# Patient Record
Sex: Female | Born: 1940
Health system: Southern US, Community
[De-identification: ages and names within clinical notes are randomized; demographics above are authoritative.]

## PROBLEM LIST (undated history)

## (undated) DIAGNOSIS — J302 Other seasonal allergic rhinitis: Secondary | ICD-10-CM

## (undated) DIAGNOSIS — Z973 Presence of spectacles and contact lenses: Secondary | ICD-10-CM

## (undated) DIAGNOSIS — Z8719 Personal history of other diseases of the digestive system: Secondary | ICD-10-CM

## (undated) DIAGNOSIS — E785 Hyperlipidemia, unspecified: Secondary | ICD-10-CM

## (undated) DIAGNOSIS — Z8673 Personal history of transient ischemic attack (TIA), and cerebral infarction without residual deficits: Secondary | ICD-10-CM

## (undated) HISTORY — PX: CATARACT EXTRACTION: SUR2

## (undated) HISTORY — PX: COLONOSCOPY: SHX174

## (undated) HISTORY — PX: TOTAL ABDOMINAL HYSTERECTOMY: SHX209

---

## 1998-08-08 ENCOUNTER — Ambulatory Visit (HOSPITAL_COMMUNITY): Admission: RE | Admit: 1998-08-08 | Discharge: 1998-08-08 | Payer: Self-pay | Admitting: General Surgery

## 1998-11-02 ENCOUNTER — Encounter: Admission: RE | Admit: 1998-11-02 | Discharge: 1999-01-31 | Payer: Self-pay | Admitting: Obstetrics and Gynecology

## 1998-11-24 HISTORY — PX: FEMORAL HERNIA REPAIR: SUR1179

## 1999-03-18 ENCOUNTER — Ambulatory Visit (HOSPITAL_COMMUNITY): Admission: RE | Admit: 1999-03-18 | Discharge: 1999-03-18 | Payer: Self-pay | Admitting: General Surgery

## 1999-03-18 ENCOUNTER — Encounter (HOSPITAL_BASED_OUTPATIENT_CLINIC_OR_DEPARTMENT_OTHER): Payer: Self-pay | Admitting: General Surgery

## 1999-11-08 ENCOUNTER — Inpatient Hospital Stay (HOSPITAL_COMMUNITY): Admission: EM | Admit: 1999-11-08 | Discharge: 1999-11-09 | Payer: Self-pay | Admitting: Emergency Medicine

## 1999-11-08 ENCOUNTER — Encounter: Payer: Self-pay | Admitting: Emergency Medicine

## 1999-11-09 ENCOUNTER — Encounter: Payer: Self-pay | Admitting: Surgery

## 1999-11-21 ENCOUNTER — Inpatient Hospital Stay (HOSPITAL_COMMUNITY): Admission: EM | Admit: 1999-11-21 | Discharge: 1999-12-02 | Payer: Self-pay | Admitting: Emergency Medicine

## 1999-11-21 ENCOUNTER — Encounter: Payer: Self-pay | Admitting: Family Medicine

## 1999-11-21 ENCOUNTER — Encounter (INDEPENDENT_AMBULATORY_CARE_PROVIDER_SITE_OTHER): Payer: Self-pay | Admitting: Specialist

## 1999-11-21 ENCOUNTER — Encounter (HOSPITAL_BASED_OUTPATIENT_CLINIC_OR_DEPARTMENT_OTHER): Payer: Self-pay | Admitting: General Surgery

## 1999-11-28 ENCOUNTER — Encounter (HOSPITAL_BASED_OUTPATIENT_CLINIC_OR_DEPARTMENT_OTHER): Payer: Self-pay | Admitting: General Surgery

## 1999-12-12 ENCOUNTER — Ambulatory Visit (HOSPITAL_COMMUNITY): Admission: RE | Admit: 1999-12-12 | Discharge: 1999-12-12 | Payer: Self-pay | Admitting: General Surgery

## 1999-12-12 ENCOUNTER — Encounter (HOSPITAL_BASED_OUTPATIENT_CLINIC_OR_DEPARTMENT_OTHER): Payer: Self-pay | Admitting: General Surgery

## 2000-01-01 ENCOUNTER — Other Ambulatory Visit: Admission: RE | Admit: 2000-01-01 | Discharge: 2000-01-01 | Payer: Self-pay | Admitting: Obstetrics and Gynecology

## 2000-03-27 ENCOUNTER — Ambulatory Visit (HOSPITAL_COMMUNITY): Admission: RE | Admit: 2000-03-27 | Discharge: 2000-03-27 | Payer: Self-pay | Admitting: Obstetrics and Gynecology

## 2000-03-27 ENCOUNTER — Encounter: Payer: Self-pay | Admitting: Obstetrics and Gynecology

## 2001-04-14 ENCOUNTER — Ambulatory Visit (HOSPITAL_COMMUNITY): Admission: RE | Admit: 2001-04-14 | Discharge: 2001-04-14 | Payer: Self-pay | Admitting: Gastroenterology

## 2001-04-14 ENCOUNTER — Encounter (INDEPENDENT_AMBULATORY_CARE_PROVIDER_SITE_OTHER): Payer: Self-pay | Admitting: *Deleted

## 2001-06-16 ENCOUNTER — Other Ambulatory Visit: Admission: RE | Admit: 2001-06-16 | Discharge: 2001-06-16 | Payer: Self-pay | Admitting: Obstetrics and Gynecology

## 2001-11-18 ENCOUNTER — Inpatient Hospital Stay (HOSPITAL_COMMUNITY): Admission: EM | Admit: 2001-11-18 | Discharge: 2001-11-23 | Payer: Self-pay | Admitting: Emergency Medicine

## 2001-11-18 ENCOUNTER — Encounter: Payer: Self-pay | Admitting: Family Medicine

## 2001-11-18 ENCOUNTER — Encounter: Payer: Self-pay | Admitting: Internal Medicine

## 2001-11-19 ENCOUNTER — Encounter: Payer: Self-pay | Admitting: Internal Medicine

## 2001-11-20 ENCOUNTER — Encounter: Payer: Self-pay | Admitting: General Surgery

## 2001-11-21 ENCOUNTER — Encounter: Payer: Self-pay | Admitting: Internal Medicine

## 2001-11-24 DIAGNOSIS — Z8673 Personal history of transient ischemic attack (TIA), and cerebral infarction without residual deficits: Secondary | ICD-10-CM

## 2001-11-24 HISTORY — DX: Personal history of transient ischemic attack (TIA), and cerebral infarction without residual deficits: Z86.73

## 2002-02-25 ENCOUNTER — Encounter: Payer: Self-pay | Admitting: Gastroenterology

## 2002-02-25 ENCOUNTER — Ambulatory Visit (HOSPITAL_COMMUNITY): Admission: RE | Admit: 2002-02-25 | Discharge: 2002-02-25 | Payer: Self-pay | Admitting: Gastroenterology

## 2002-04-02 ENCOUNTER — Inpatient Hospital Stay (HOSPITAL_COMMUNITY): Admission: EM | Admit: 2002-04-02 | Discharge: 2002-04-05 | Payer: Self-pay | Admitting: *Deleted

## 2002-04-03 ENCOUNTER — Encounter: Payer: Self-pay | Admitting: General Surgery

## 2002-04-04 ENCOUNTER — Encounter: Payer: Self-pay | Admitting: General Surgery

## 2002-05-23 ENCOUNTER — Encounter: Admission: RE | Admit: 2002-05-23 | Discharge: 2002-05-23 | Payer: Self-pay | Admitting: General Surgery

## 2002-05-23 ENCOUNTER — Encounter: Payer: Self-pay | Admitting: General Surgery

## 2002-05-24 ENCOUNTER — Encounter: Payer: Self-pay | Admitting: General Surgery

## 2002-05-24 ENCOUNTER — Encounter: Admission: RE | Admit: 2002-05-24 | Discharge: 2002-05-24 | Payer: Self-pay | Admitting: General Surgery

## 2002-05-27 ENCOUNTER — Encounter: Payer: Self-pay | Admitting: Emergency Medicine

## 2002-05-27 ENCOUNTER — Encounter: Payer: Self-pay | Admitting: Surgery

## 2002-05-27 ENCOUNTER — Inpatient Hospital Stay (HOSPITAL_COMMUNITY): Admission: EM | Admit: 2002-05-27 | Discharge: 2002-05-29 | Payer: Self-pay | Admitting: Emergency Medicine

## 2002-05-28 ENCOUNTER — Encounter: Payer: Self-pay | Admitting: General Surgery

## 2002-05-30 ENCOUNTER — Inpatient Hospital Stay (HOSPITAL_COMMUNITY): Admission: EM | Admit: 2002-05-30 | Discharge: 2002-05-31 | Payer: Self-pay

## 2002-05-30 ENCOUNTER — Encounter: Payer: Self-pay | Admitting: Neurology

## 2002-05-31 ENCOUNTER — Encounter: Payer: Self-pay | Admitting: Neurology

## 2002-05-31 ENCOUNTER — Encounter (INDEPENDENT_AMBULATORY_CARE_PROVIDER_SITE_OTHER): Payer: Self-pay | Admitting: Cardiology

## 2003-03-07 ENCOUNTER — Encounter: Payer: Self-pay | Admitting: Gastroenterology

## 2003-03-07 ENCOUNTER — Encounter: Admission: RE | Admit: 2003-03-07 | Discharge: 2003-03-07 | Payer: Self-pay | Admitting: Gastroenterology

## 2006-03-25 ENCOUNTER — Encounter (HOSPITAL_BASED_OUTPATIENT_CLINIC_OR_DEPARTMENT_OTHER): Payer: Self-pay | Admitting: General Surgery

## 2009-04-13 ENCOUNTER — Emergency Department (HOSPITAL_COMMUNITY): Admission: EM | Admit: 2009-04-13 | Discharge: 2009-04-13 | Payer: Self-pay | Admitting: Emergency Medicine

## 2009-04-13 ENCOUNTER — Emergency Department (HOSPITAL_COMMUNITY): Admission: EM | Admit: 2009-04-13 | Discharge: 2009-04-13 | Payer: Self-pay | Admitting: Family Medicine

## 2009-04-13 IMAGING — CR DG CHEST 2V
2 series · 2 of 2 positions shown · non-contrast
Comparison: [DATE]

CLINICAL DATA: Short of breath

CHEST - 2 VIEW

[w chest pa]
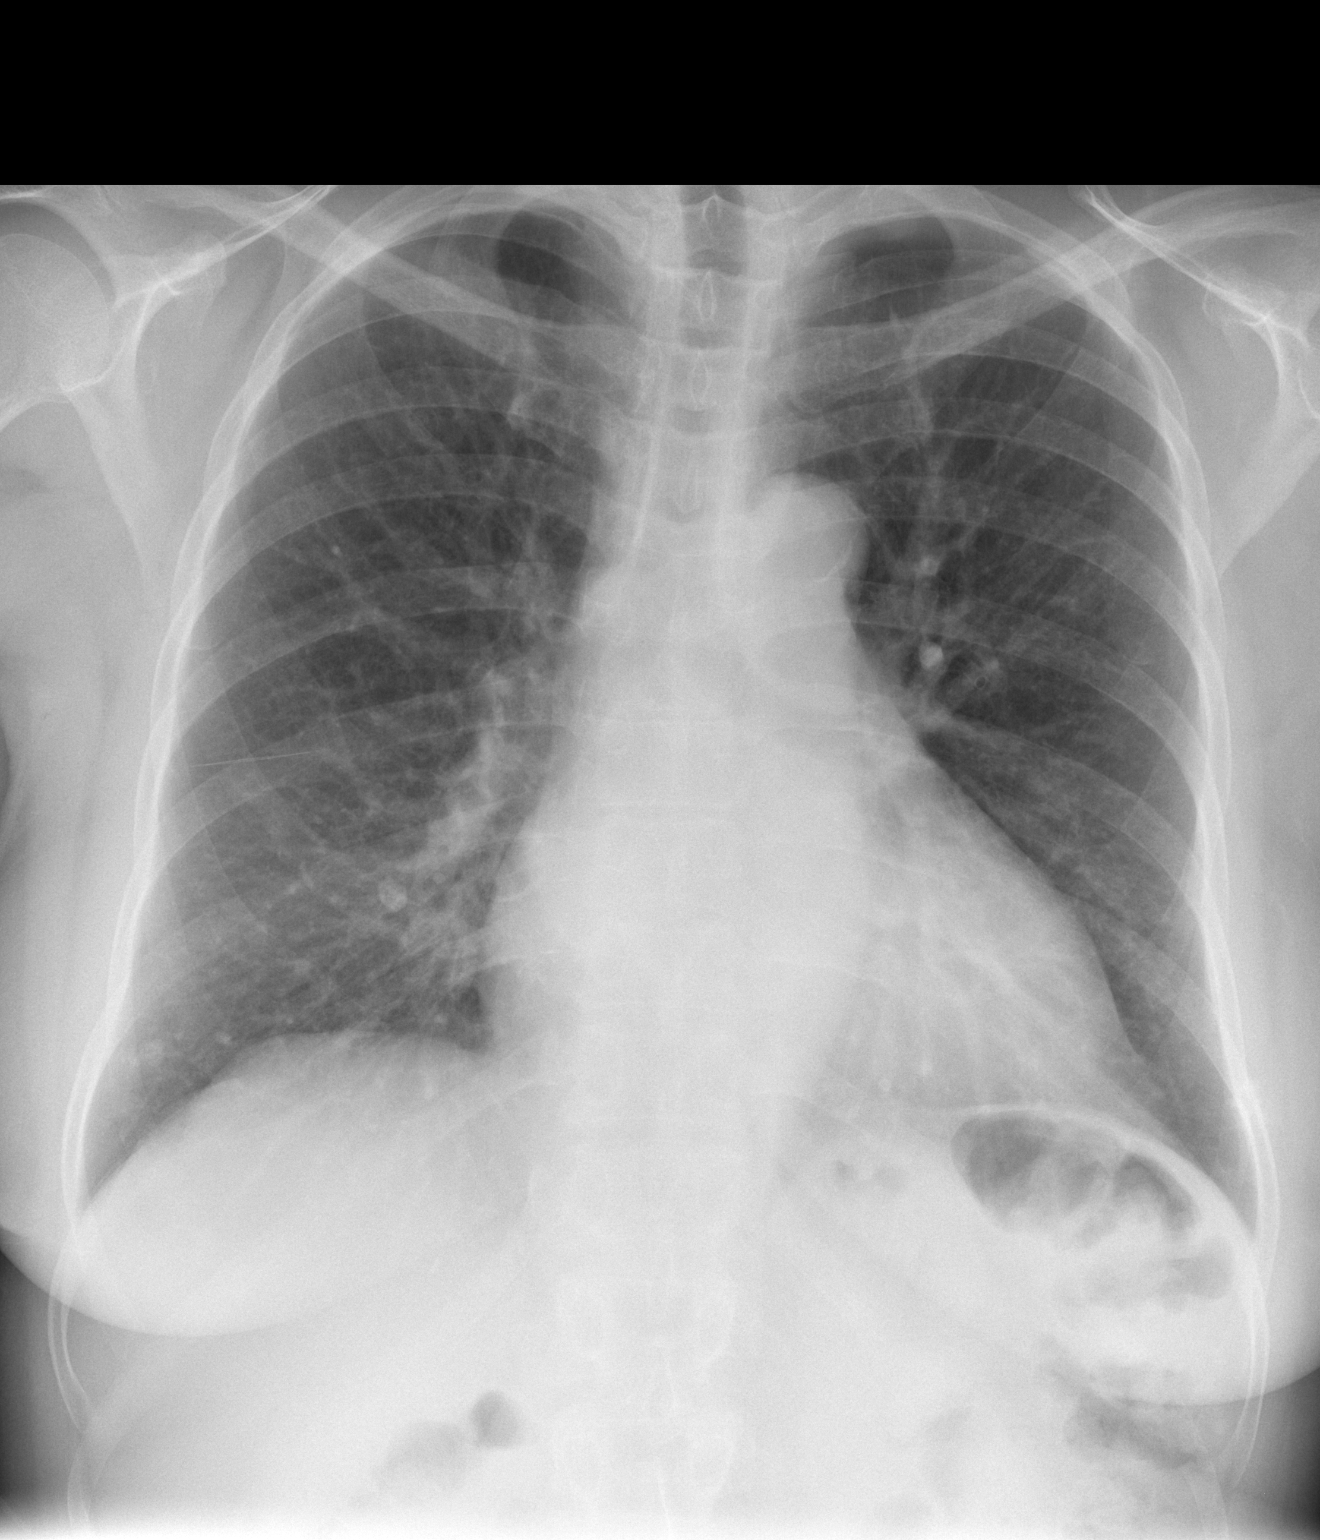

[w chest lat]
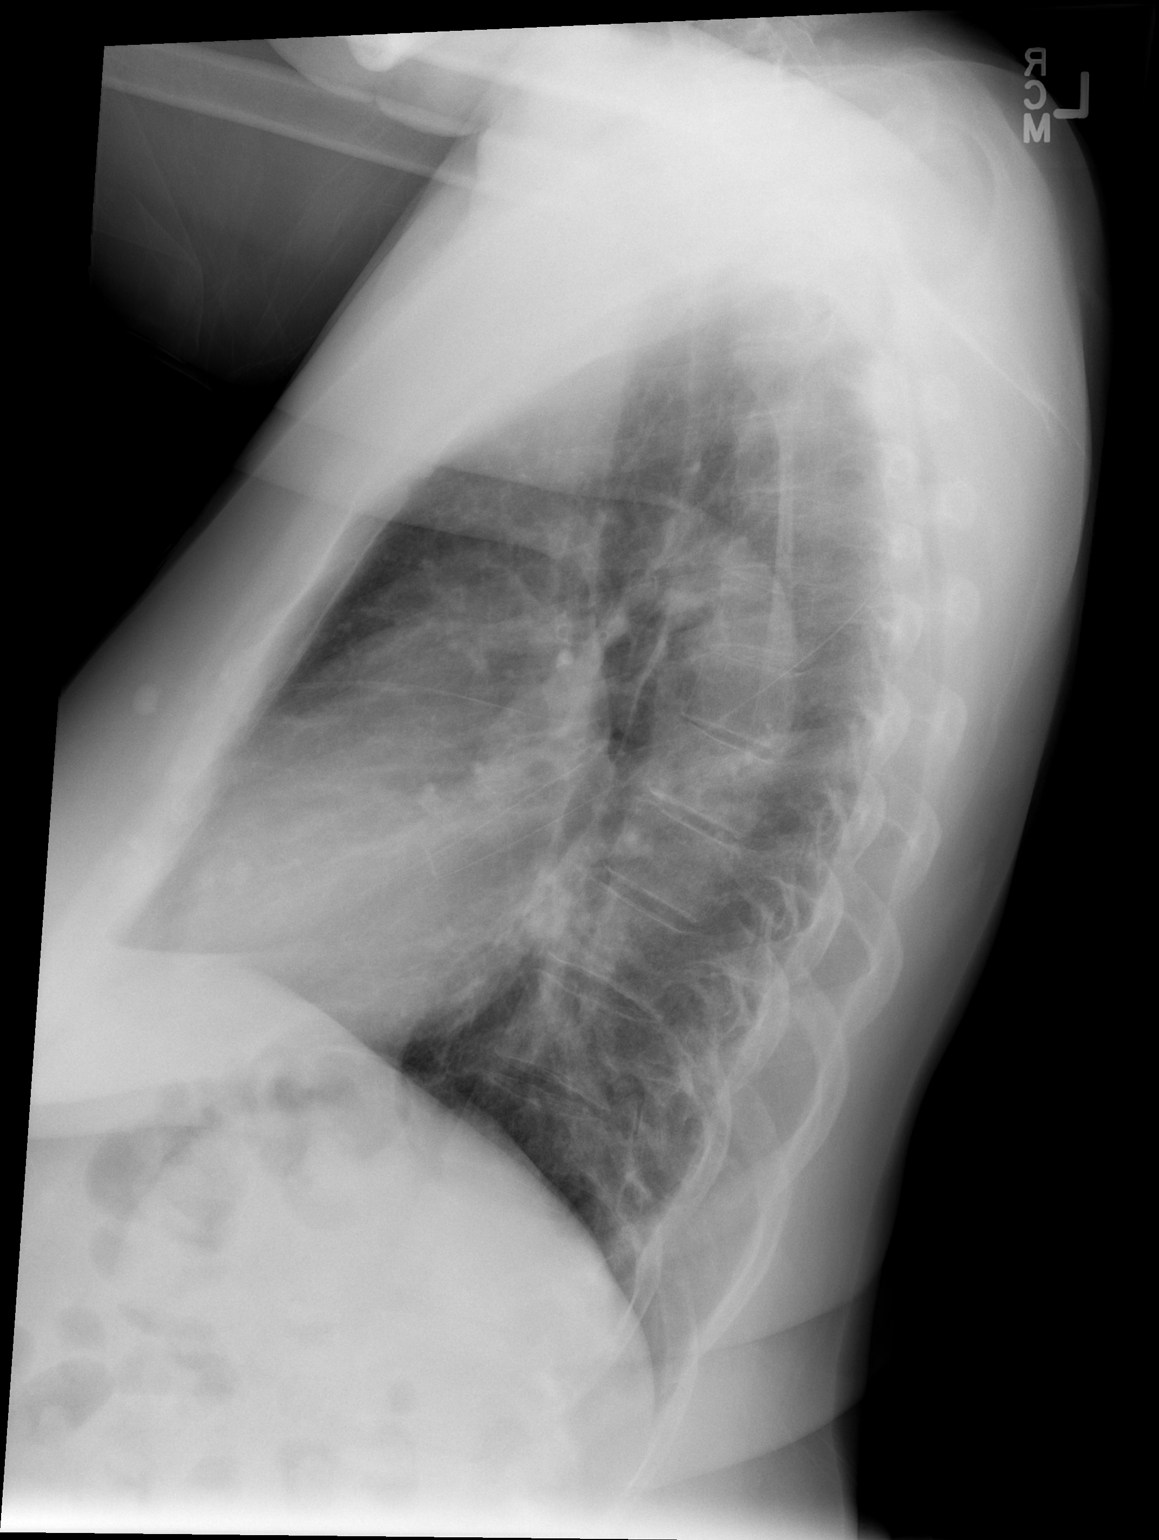

[2 of 2 positions shown; findings below may reference images not displayed]

FINDINGS: Cardiomegaly and pulmonary vascular congestion.  No
pulmonary edema or infiltrate.
IMPRESSION: Cardiomegaly and moderate vascular congestion.

## 2009-05-05 ENCOUNTER — Ambulatory Visit: Payer: Self-pay | Admitting: Cardiovascular Disease

## 2009-05-05 ENCOUNTER — Inpatient Hospital Stay (HOSPITAL_COMMUNITY): Admission: EM | Admit: 2009-05-05 | Discharge: 2009-05-07 | Payer: Self-pay | Admitting: Emergency Medicine

## 2009-05-05 ENCOUNTER — Ambulatory Visit: Payer: Self-pay | Admitting: Family Medicine

## 2009-05-05 IMAGING — CT CT ANGIO CHEST
2 of 6 series · 19 of 36 positions shown · IV contrast (APPLIED)
Comparison: None

CLINICAL DATA: Midsternal chest pain, shortness of breath

CT ANGIOGRAPHY CHEST WITH CONTRAST
TECHNIQUE: Multidetector CT imaging of the chest was performed
using the standard protocol during bolus administration of
intravenous contrast. Multiplanar CT image reconstructions
including MIPs were obtained to evaluate the vascular anatomy.
Contrast: 80 ml [DF] IV

[Series 8: pulm embolism 1.0 b25f thins · axial · 0.56mm/px · z∈[-76,+112]mm · 18 of 209 slices shown]
[im 11/209  lung]
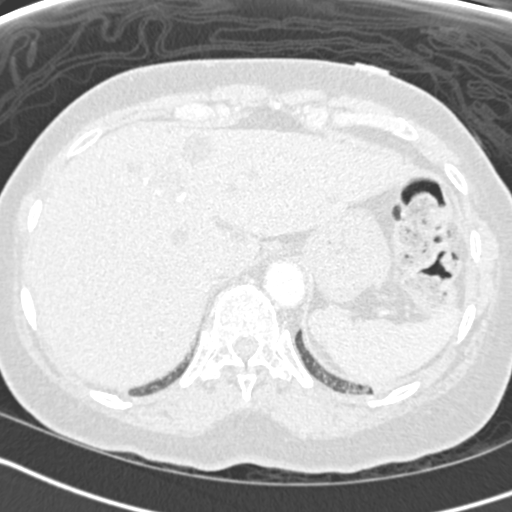
[im 21/209  mediastinal]
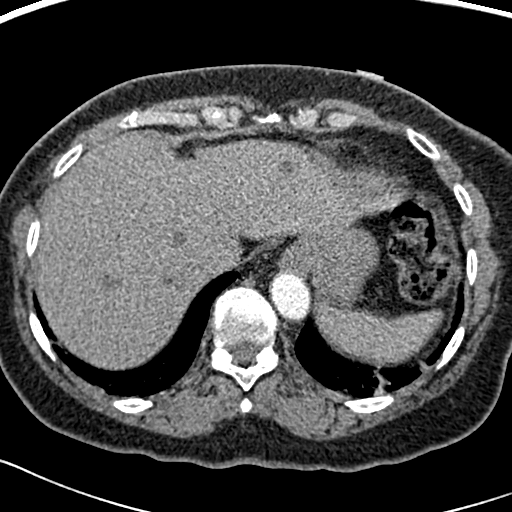
[im 32/209  lung]
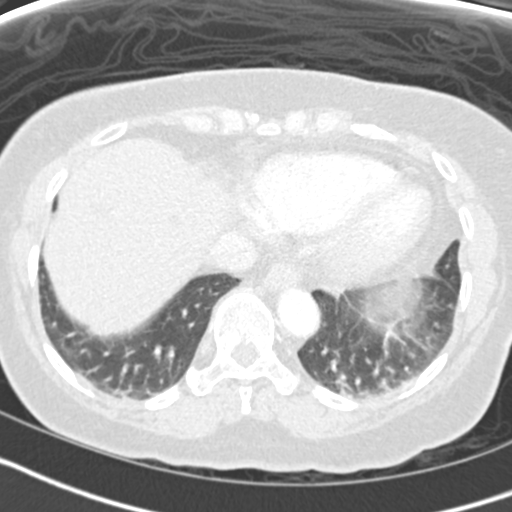
[im 42/209  mediastinal]
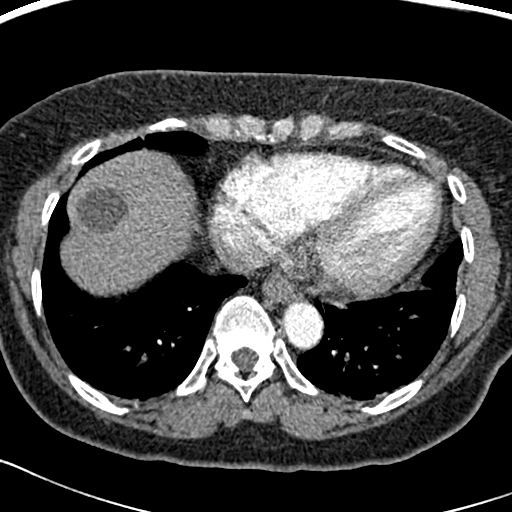
[im 53/209  lung]
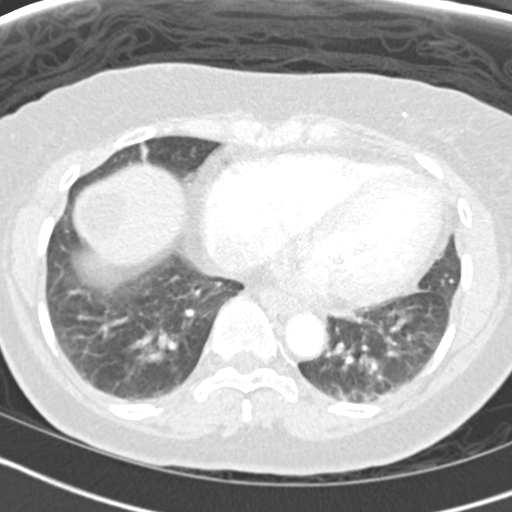
[im 63/209  mediastinal]
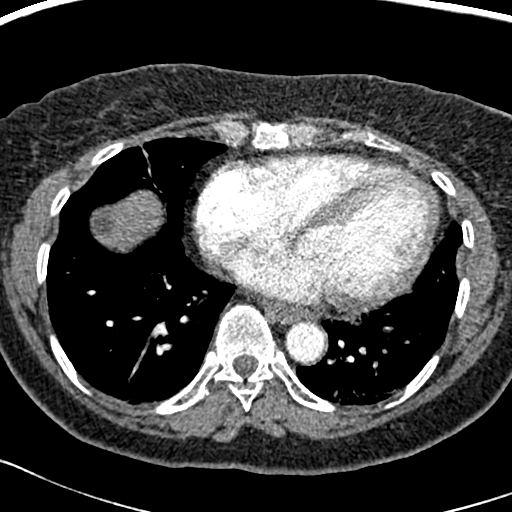
[im 73/209  lung]
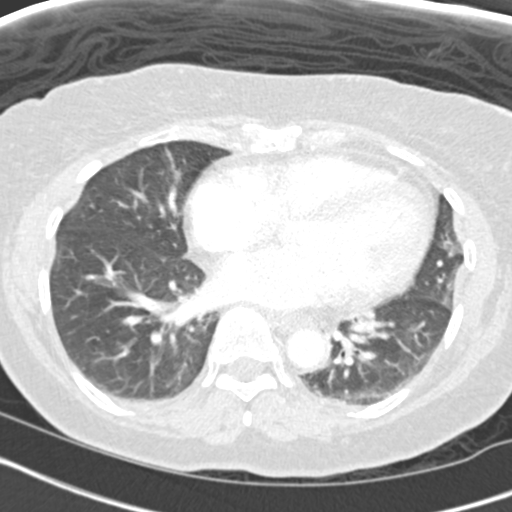
[im 84/209  mediastinal]
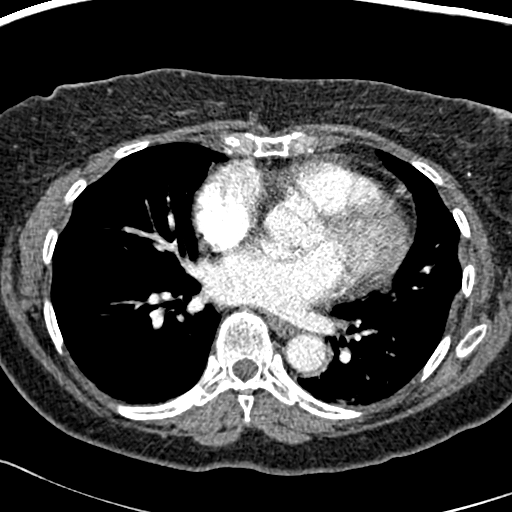
[im 94/209  lung]
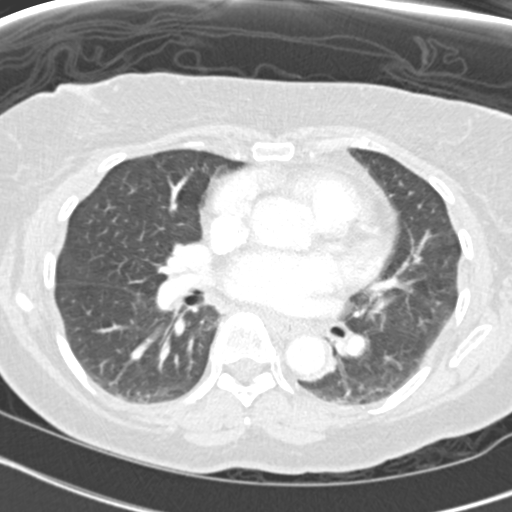
[im 115/209  mediastinal]
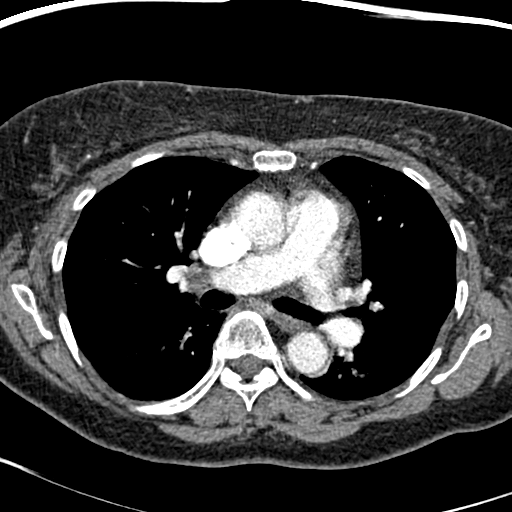
[im 125/209  lung]
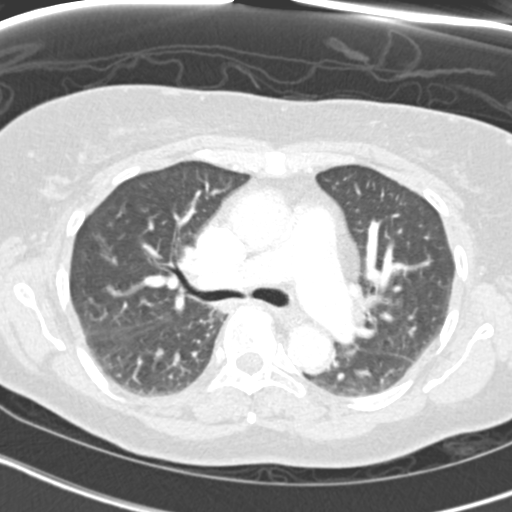
[im 136/209  mediastinal]
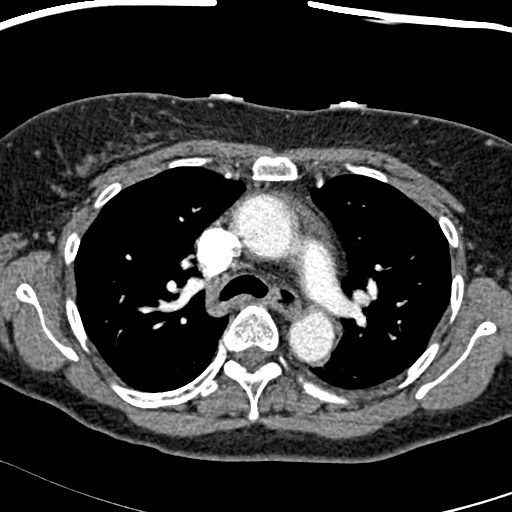
[im 146/209  lung]
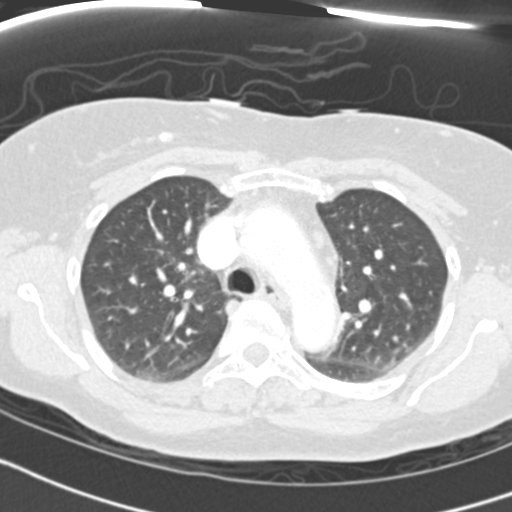
[im 157/209  mediastinal]
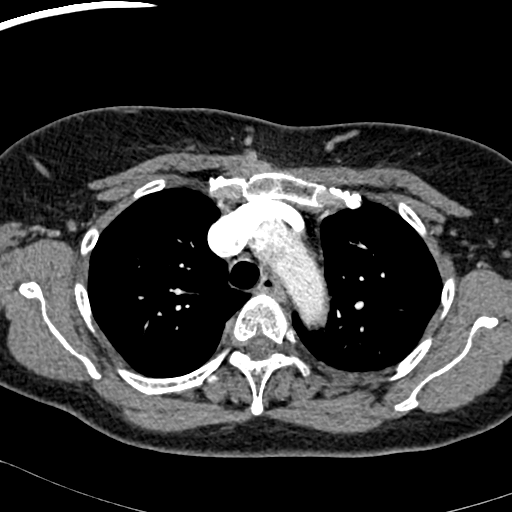
[im 167/209  lung]
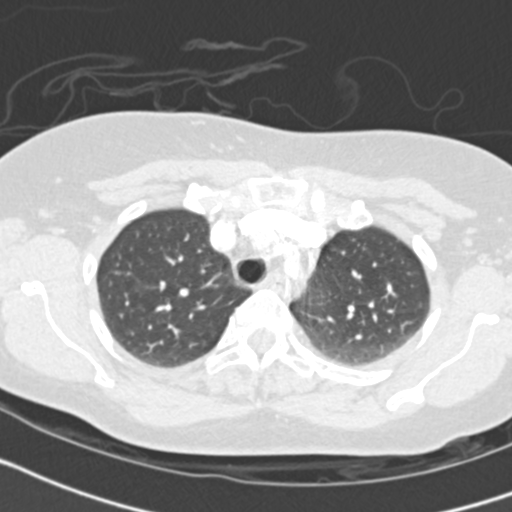
[im 177/209  mediastinal]
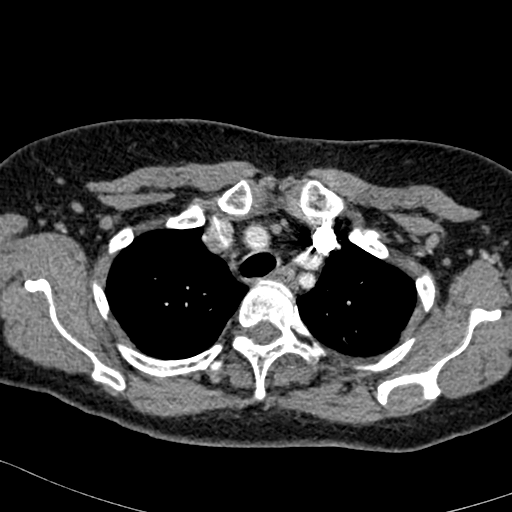
[im 188/209  lung]
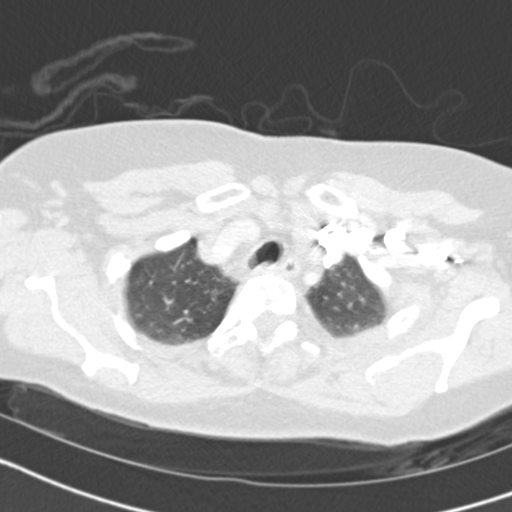
[im 198/209  mediastinal]
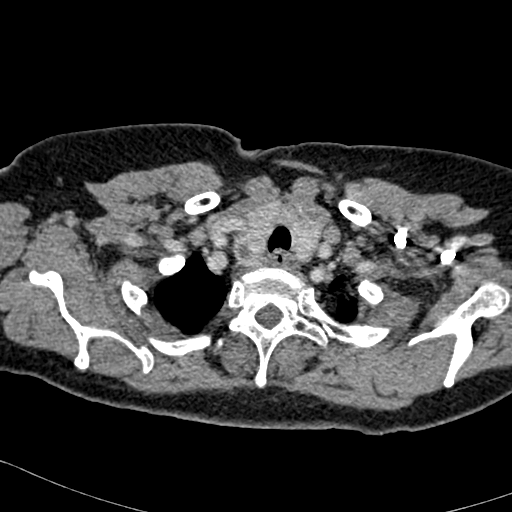

[Series 603: cor · coronal · 0.56mm/px · 1 of 90 slices shown]
[im 45/90  mediastinal]
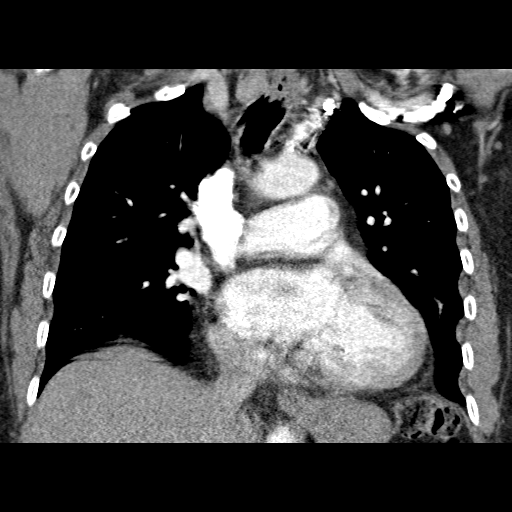

[19 of 36 positions shown; findings below may reference images not displayed]

FINDINGS: There is good contrast opacification of pulmonary artery
branches with no discrete filling defects to indicate acute PE.
Patient breathing during the acquisition degrades some of the
images.  There is early contrast opacification of the thoracic
aorta without evidence of dissection, aneurysm, or stenosis.
Patchy atheromatous plaque is noted in the arch and descending
thoracic aorta.  No pleural or pericardial effusion.  No hilar or
mediastinal adenopathy.  7 mm low attenuation right thyroid lesion
noted.  Several hepatic cysts are stable since previous abdomen CT
of [DATE].  There is partial calcification of a 3cm lesion in
the medial left hepatic segment which is above fluid attenuation.
However, this has not increased in size since the prior study.
There is mild dependent atelectasis posteriorly in both lower
lobes.  Lungs are otherwise clear.

Review of the MIP images confirms the above findings.
IMPRESSION: 1.  Negative for acute PE or thoracic aorta   dissection.
2.  Hepatic lesions which have been previously described.
3.  7 mm right thyroid lesion.  Dedicated elective thyroid
ultrasound could be used for further characterization.

## 2009-05-05 IMAGING — CR DG CHEST 2V
2 series · 2 of 2 positions shown · non-contrast
Comparison: [DATE]

CLINICAL DATA: Chest pain

CHEST - 2 VIEW

[w chest pa]
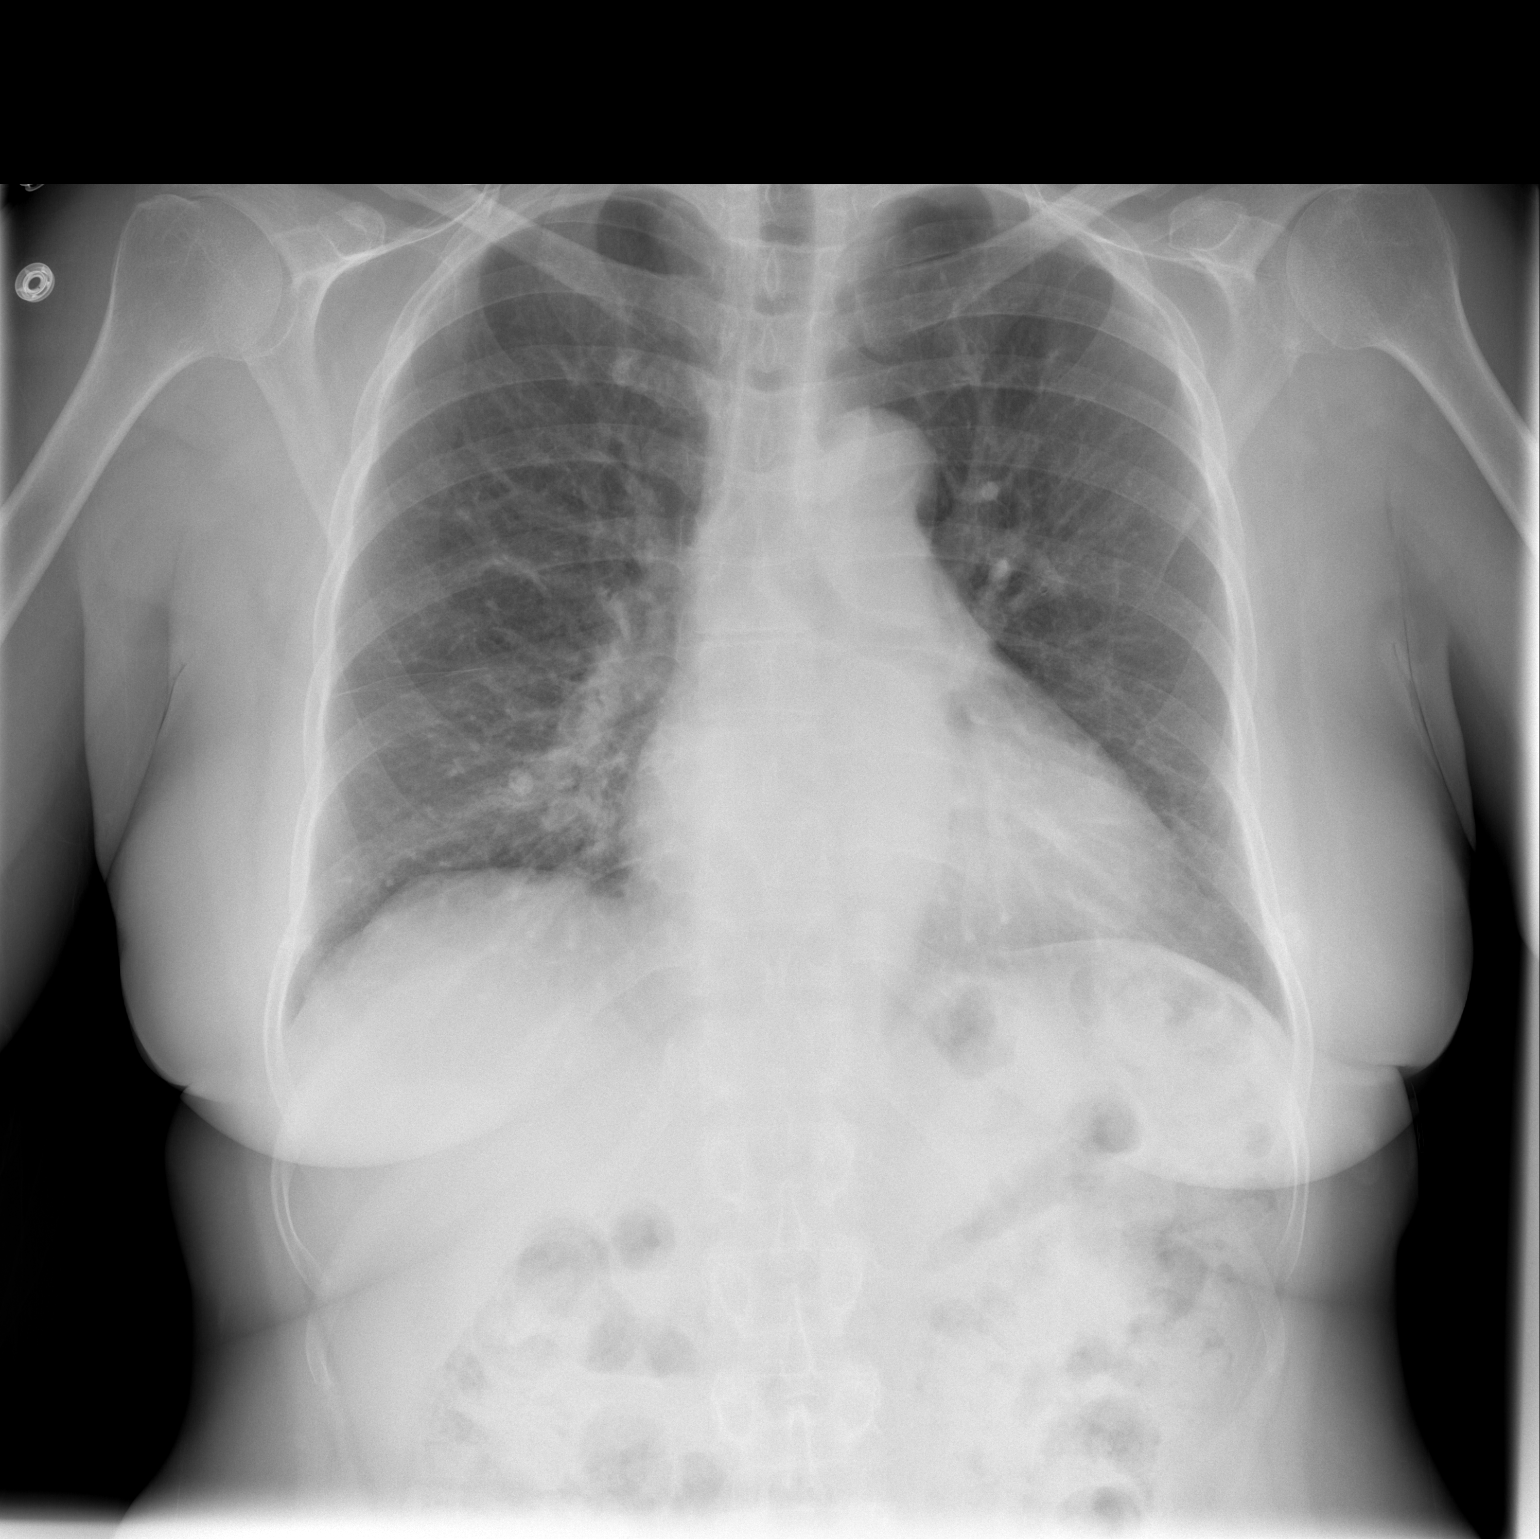

[w chest lat]
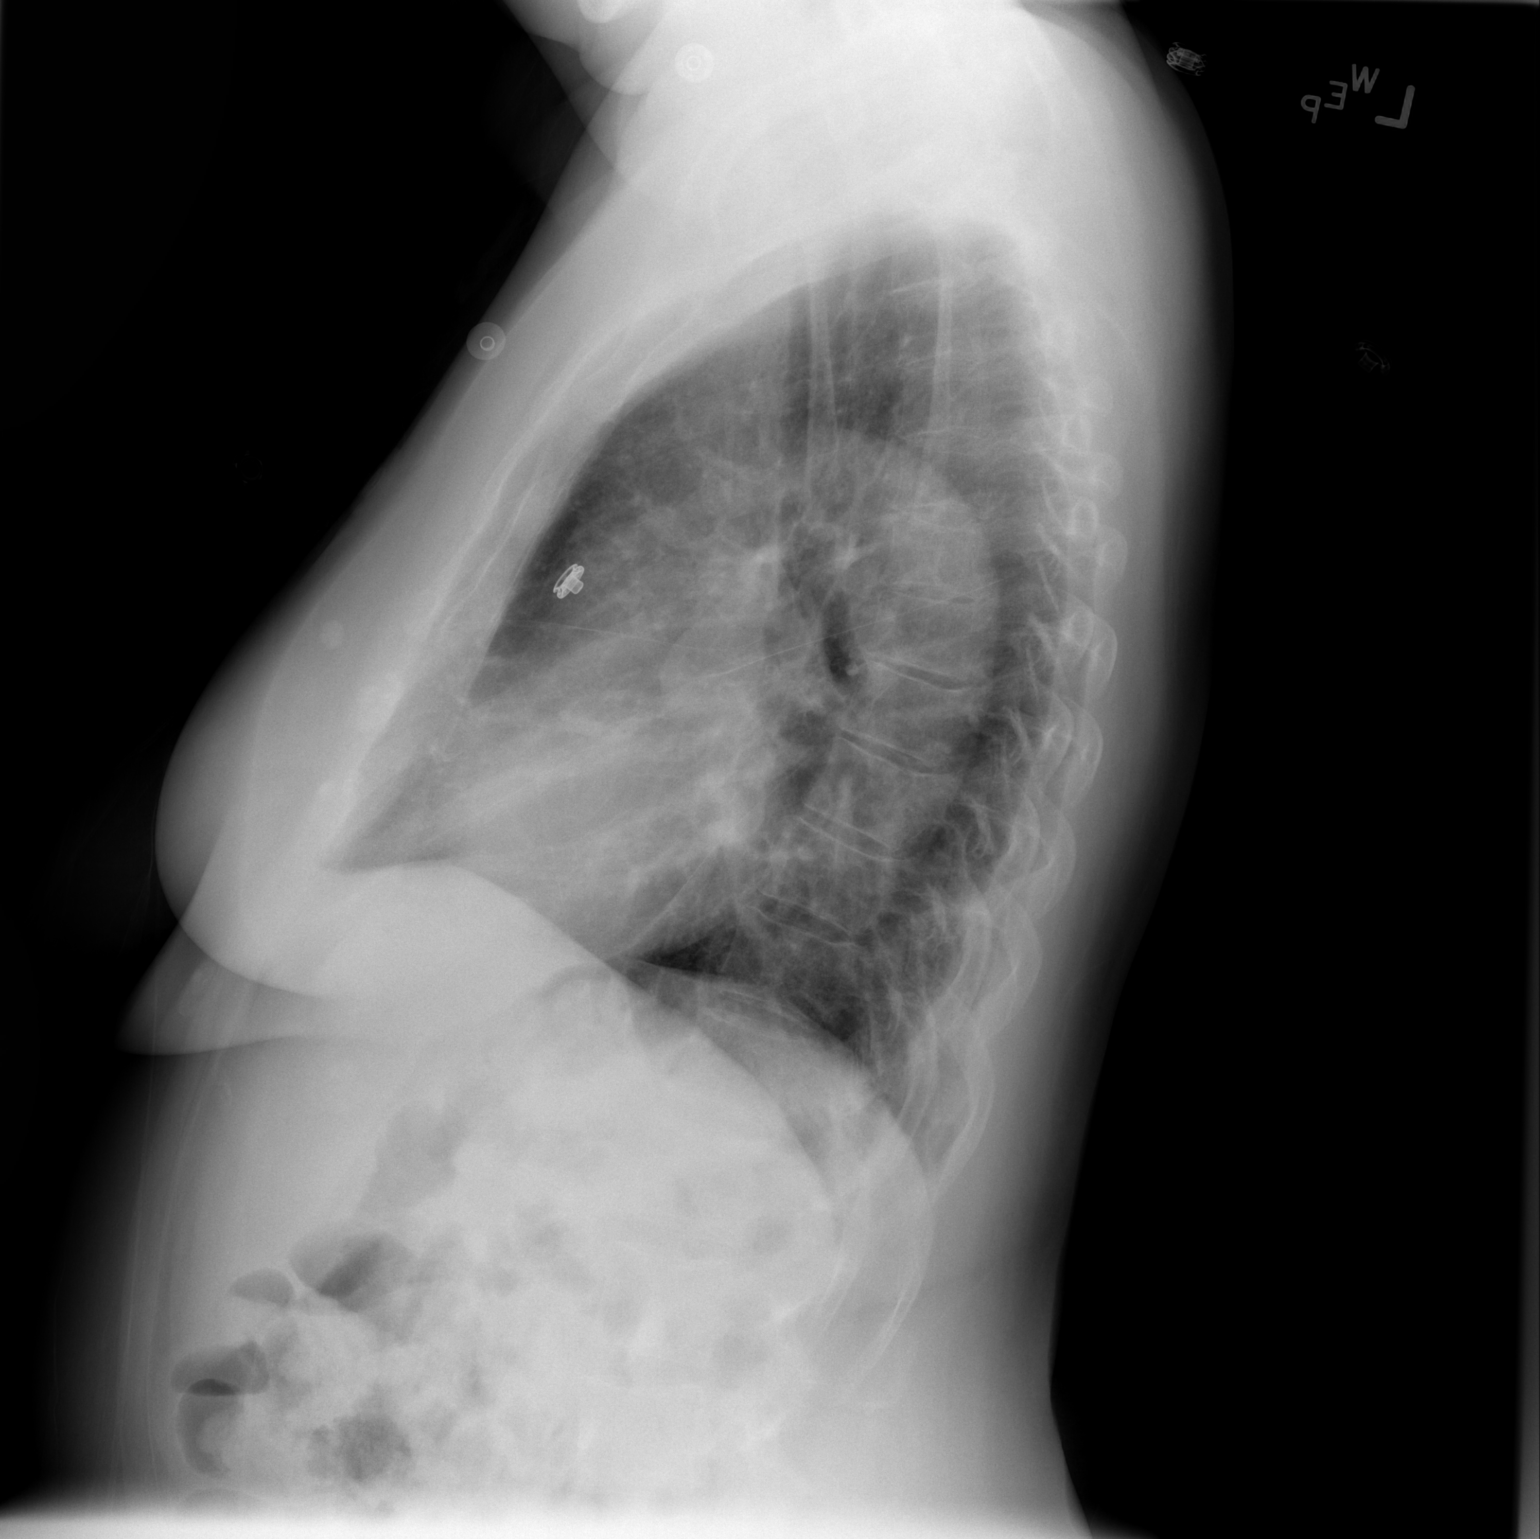

[2 of 2 positions shown; findings below may reference images not displayed]

FINDINGS: Mild cardiomegaly again noted.  No acute infiltrate or
pleural effusion.  No pulmonary edema.  Mild degenerative changes
thoracic spine.
IMPRESSION: No active disease.  Mild cardiomegaly.

## 2009-05-06 ENCOUNTER — Encounter (INDEPENDENT_AMBULATORY_CARE_PROVIDER_SITE_OTHER): Payer: Self-pay | Admitting: Internal Medicine

## 2011-03-03 LAB — POCT CARDIAC MARKERS
Myoglobin, poc: 84.2 ng/mL (ref 12–200)
Troponin i, poc: <0.05 ng/mL (ref 0.00–0.09)

## 2011-03-03 LAB — BASIC METABOLIC PANEL
BUN: 13 mg/dL (ref 6–23)
CO2: 28 mEq/L (ref 19–32)
Chloride: 107 mEq/L (ref 96–112)
Creatinine, Ser: 0.84 mg/dL (ref 0.4–1.2)
Glucose, Bld: 128 mg/dL — ABNORMAL HIGH (ref 70–99)

## 2011-03-03 LAB — POCT I-STAT, CHEM 8
Creatinine, Ser: 1.1 mg/dL (ref 0.4–1.2)
HCT: 45 % (ref 36.0–46.0)
Hemoglobin: 15.3 g/dL — ABNORMAL HIGH (ref 12.0–15.0)
Potassium: 5.6 mEq/L — ABNORMAL HIGH (ref 3.5–5.1)
Sodium: 139 mEq/L (ref 135–145)

## 2011-03-03 LAB — CK TOTAL AND CKMB (NOT AT ARMC)
CK, MB: 1.2 ng/mL (ref 0.3–4.0)
Relative Index: INVALID (ref 0.0–2.5)
Total CK: 49 U/L (ref 7–177)

## 2011-03-03 LAB — CBC
Hemoglobin: 11.8 g/dL — ABNORMAL LOW (ref 12.0–15.0)
Hemoglobin: 14.2 g/dL (ref 12.0–15.0)
MCHC: 33.5 g/dL (ref 30.0–36.0)
MCHC: 33.9 g/dL (ref 30.0–36.0)
MCV: 91.8 fL (ref 78.0–100.0)
MCV: 92.4 fL (ref 78.0–100.0)
RBC: 3.8 MIL/uL — ABNORMAL LOW (ref 3.87–5.11)
RBC: 4.59 MIL/uL (ref 3.87–5.11)
WBC: 6.7 10*3/uL (ref 4.0–10.5)

## 2011-03-03 LAB — COMPREHENSIVE METABOLIC PANEL
ALT: 17 U/L (ref 0–35)
AST: 20 U/L (ref 0–37)
CO2: 28 mEq/L (ref 19–32)
Calcium: 9 mg/dL (ref 8.4–10.5)
Chloride: 107 mEq/L (ref 96–112)
Creatinine, Ser: 0.82 mg/dL (ref 0.4–1.2)
GFR calc non Af Amer: >60 mL/min (ref >60)
Glucose, Bld: 114 mg/dL — ABNORMAL HIGH (ref 70–99)
Sodium: 140 mEq/L (ref 135–145)
Total Bilirubin: 0.6 mg/dL (ref 0.3–1.2)

## 2011-03-03 LAB — PROTIME-INR
INR: 1 (ref 0.00–1.49)
Prothrombin Time: 13.3 seconds (ref 11.6–15.2)

## 2011-03-03 LAB — DIFFERENTIAL
Basophils Relative: 0 % (ref 0–1)
Monocytes Absolute: 0.5 10*3/uL (ref 0.1–1.0)
Monocytes Relative: 6 % (ref 3–12)
Neutro Abs: 6.3 10*3/uL (ref 1.7–7.7)

## 2011-03-03 LAB — CARDIAC PANEL(CRET KIN+CKTOT+MB+TROPI)
CK, MB: 0.9 ng/mL (ref 0.3–4.0)
CK, MB: 0.9 ng/mL (ref 0.3–4.0)
Relative Index: INVALID (ref 0.0–2.5)
Troponin I: 0.13 ng/mL — ABNORMAL HIGH (ref 0.00–0.06)

## 2011-03-03 LAB — TSH: TSH: 0.344 u[IU]/mL — ABNORMAL LOW (ref 0.350–4.500)

## 2011-03-03 LAB — HEMOGLOBIN A1C: Hgb A1c MFr Bld: 5.7 % (ref 4.6–6.1)

## 2011-03-03 LAB — LIPID PANEL: Cholesterol: 226 mg/dL — ABNORMAL HIGH (ref 0–200)

## 2011-03-03 LAB — GLUCOSE, CAPILLARY

## 2011-03-04 LAB — URINALYSIS, ROUTINE W REFLEX MICROSCOPIC
Bilirubin Urine: NEGATIVE
Glucose, UA: NEGATIVE mg/dL
Hgb urine dipstick: NEGATIVE
Specific Gravity, Urine: 1.012 (ref 1.005–1.030)

## 2011-03-04 LAB — CBC
HCT: 35.1 % — ABNORMAL LOW (ref 36.0–46.0)
Hemoglobin: 12.3 g/dL (ref 12.0–15.0)
RBC: 3.84 MIL/uL — ABNORMAL LOW (ref 3.87–5.11)
RDW: 13.2 % (ref 11.5–15.5)

## 2011-03-04 LAB — POCT I-STAT, CHEM 8
Creatinine, Ser: 1.2 mg/dL (ref 0.4–1.2)
Glucose, Bld: 101 mg/dL — ABNORMAL HIGH (ref 70–99)
HCT: 38 % (ref 36.0–46.0)
Hemoglobin: 12.9 g/dL (ref 12.0–15.0)
Sodium: 140 mEq/L (ref 135–145)
TCO2: 28 mmol/L (ref 0–100)

## 2011-03-04 LAB — DIFFERENTIAL
Basophils Absolute: 0 10*3/uL (ref 0.0–0.1)
Eosinophils Relative: 4 % (ref 0–5)
Lymphocytes Relative: 31 % (ref 12–46)
Lymphs Abs: 1.4 10*3/uL (ref 0.7–4.0)
Monocytes Absolute: 0.5 10*3/uL (ref 0.1–1.0)
Monocytes Relative: 11 % (ref 3–12)
Neutro Abs: 2.5 10*3/uL (ref 1.7–7.7)

## 2011-03-04 LAB — URINE MICROSCOPIC-ADD ON

## 2011-03-04 LAB — POCT CARDIAC MARKERS: Myoglobin, poc: 57.9 ng/mL (ref 12–200)

## 2011-04-08 NOTE — Consult Note (Signed)
NAMEMarland Kitchen  BRANDE, UNCAPHER NO.:  [id number]   MEDICAL RECORD NO.:  [id number]          PATIENT TYPE:  OBV   LOCATION:  2905                         FACILITY:  [facility]   PHYSICIAN:  Christell Faith, MD   DATE OF BIRTH:  1941/01/05   DATE OF CONSULTATION:  05/05/2009  DATE OF DISCHARGE:                                 CONSULTATION   REQUESTING PHYSICIAN:  Helane Rima, MD, with the [facility]  [facility].   RESPONDING CARDIOLOGIST:  Georga Hacking, MD   PRIMARY CARE PHYSICIAN:  Dr. Drucie Opitz in [facility].   REASON FOR CONSULT:  Chest pain and syncope, evaluate and treat.   CHIEF COMPLAINT:  Chest pain.   HISTORY OF PRESENT ILLNESS:  This is a 70 year old African American  female with no known cardiac history who came to the emergency  department this morning complaining of chest pain.  The pain occurred  after awakening this morning.  The onset was with minimal exertion  around her house.  She describes it as 10/10 and uses descriptors  including sharp and like a cramp and the pain radiated to her back.  In the emergency department, the pain was relieved completely by  nitroglycerin and aspirin.  A CT angiogram ruled out pulmonary embolism.  The patient was admitted to the [facility] earlier  today, and on the floor of this evening, she had recurrence of sharp  substernal pain, worse when taking a deep breath, better when sitting  forward and worse on lying back.  She received a nitroglycerin tab and  within 1-2 minutes suddenly felt lightheaded, sweaty, and nauseous  followed by syncope.  Telemetry demonstrates abrupt loss of sinus P-  waves (baseline sinus tachycardia at 102 beats per minute) for  approximately 2.5 seconds followed by 2 nonconducted sinus beats  followed by prompt resumption of the sinus rhythm.  EKG now demonstrates  significant anterior T-wave inversions, which were not present on  initial EKG.  The  patient is once again pain free but still appears  somewhat diaphoretic.   PAST MEDICAL HISTORY:  1. History of incarcerated hernia repair and subsequent small bowel      obstruction.  2. History of total abdominal hysterectomy.  3. Emergency department visit on Apr 13, 2009, for chest pain      discharged to follow up with an outpatient stress test.  4. Irritable bowel syndrome.   SOCIAL HISTORY:  She is single, lives in [place].  She smoked for 10  years but quit in 1976.  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   FAMILY HISTORY:  Mother is still alive in her 79s.  Father died in World  War II.  The patient has no siblings and no children.   ALLERGIES:  PENICILLIN.   MEDICATIONS:  Vitamin D 50,000 units weekly.   REVIEW OF SYSTEMS:  Positive for stress related to her work, positive  for chest pain last month prompting an emergency room visit; otherwise,  14 systems reviewed and negative.   PHYSICAL EXAMINATION:  VITAL SIGNS:  Temperature 97.2, pulse  93,  respiratory rate 18, blood pressure 141/82, oxygen saturation 98% on  room air.  GENERAL:  This a pleasant African American female in no acute distress.  She is mildly diaphoretic.  Pupils are round and reactive.  Sclerae are  clear.  Mucous membranes are moist.  NECK:  Supple.  Neck veins are flat.  No carotid bruits.  LUNGS:  Clear to auscultation bilaterally without wheezing or rales.  CARDIAC:  Normal rate, regular rhythm.  There is a 3/6 harsh systolic  ejection murmur, best heard over the aortic valve area.  S2 is  preserved.  ABDOMEN:  Soft, nontender, nondistended with no bruits.  EXTREMITIES:  A 2+ radial and dorsalis pedis pulses bilaterally.  No  edema.   DIAGNOSTIC TEST:  1. CT angiogram negative for pulmonary embolism, negative for      pericardial effusions.  2. EKG #1 in the emergency department, sinus rhythm at 87 beats per      minute, nonspecific ST and T-wave changes.  EKG #2 on the floor,  sinus rhythm with new significant T-wave inversions  in the anterior leads.   LABORATORY DATA:  White blood cell 8.7, hemoglobin 14.2, platelets 191.  Sodium 139, potassium 5.6, BUN 20, creatinine 1.1.  Initial point-of-  care cardiac enzymes undetectable, repeat CK 49, MB 1.2, troponin 0.13.   IMPRESSION:  A 70 year old African American female with chest pain and  an abnormal EKG concerning for acute coronary syndrome (despite  prominent pericardial pain features).  She also has a significant  murmur, and syncope which was probably due to a vagal spell.   PLAN:  1. Transfer to CCU.  Continue to cycle cardiac enzymes and EKGs.  Keep      atropine at the bedside.  2. Increase aspirin to 325 mg daily, load with 600 mg of Plavix      followed by 75 mg daily, and initiate unfractionated heparin drip.  3. Keep n.p.o. after Sunday night for probable catheterization on      Monday.  4. Check transthoracic echocardiogram to evaluate murmur.  5. Check fasting lipid panel and initiate Lipitor 80 mg p.o. daily.  6. We will attempt to cool the patient down with medicines with an eye      towards catheterization on Monday; however should she require more      urgent catheterization, we could proceed over the weekend.      Christell Faith, MD  Electronically Signed     NDL/MEDQ  D:  05/05/2009  T:  05/06/2009  Job:  [id number]

## 2011-04-08 NOTE — Consult Note (Signed)
NAMEMarland Kitchen  Jennifer Calhoun, Jennifer Calhoun NO.:  [id number]   MEDICAL RECORD NO.:  [id number]          PATIENT TYPE:  EMS   LOCATION:  MAJO                         FACILITY:  [facility]   PHYSICIAN:  Manus Gunning, MD      DATE OF BIRTH:  Nov 14, 1941   DATE OF CONSULTATION:  04/13/2009  DATE OF DISCHARGE:  04/13/2009                                 CONSULTATION   REASON FOR CONSULTATION:  Evaluation for chest pain.   HISTORY OF PRESENT ILLNESS:  Jennifer Calhoun is pleasant 70 year old African  American female who presented today with chief complaints of chest  pressure and lightheadedness.  Both symptoms are mutually exclusive.  She claims that over the past week or so she has been experiencing  occasional bouts of lightheadedness and dizziness.  She claims that  there is no precipitating factor. It usually happens later in the day  relieved by resting.  She also provides a history that she had recently  taken up a new job, highly stressful during which time she works very  hard.  Her p.o. intake has been normal but she says that she has had no  structured meals and has been eating essentially whatever she can get  her hands on in between her work.  Her p.o. fluid intake she claims has  decreased as well, but again did not to an amount where she is not  taking any at all.  She denies any blurring of vision.  Denies any loss  of consciousness.  No falls.  No recent head injuries.  No tinnitus.  Denies seizures, denies trauma.  No odynophagia, no dysphagia.  No neck  fullness.  No palpitations.  Her chest pain she thinks has been ongoing  for the past two or three days,  typically when she lies down at night  to fall asleep.  She describes the pain as a pressure sensation.  At its  worst, it is approximately 4-5/10.  There is no precipitating factor  that she has appreciated.  No true  aggravating or relieving factors.  It has spontaneous onset and relief.  The patient denies palpitations,  denies  PND, denies orthopnea.  Denies radiation.  She denies nausea,  vomiting, no diaphoresis. No abdominal pain.  The chest pain is  nonreproducible and is not pleuritic.  She denies dysuria or polyuria.  No fevers.  No diarrhea or constipation. No melenic stools.  No bright  red blood per rectum.  No hematuria.   PAST MEDICAL AND SURGICAL HISTORY:  The patient has no significant  medical history.  She has a history of herniorrhaphy and small  intestinal resection secondary to small bowel obstruction currently  completely resolved.   SOCIAL HISTORY:  Denies tobacco, illicits or alcohol.  Recently has  switched jobs to a high stress high energy  with long hours.   FAMILY HISTORY:  Jennifer Calhoun died in World War II.  Jennifer Calhoun is 91 years of the  patient and has hypertension.   HOME MEDICATIONS:  None.   ALLERGIES:  Has seasonal allergies and is ALLERGIC TO PENICILLIN,  REACTION IS UNKNOWN.  REVIEW OF SYSTEMS:  Essentially 14 point review of systems performed.  Pertinent positives and negatives as described above.   PHYSICAL EXAMINATION:  VITALS:  At time of presentation temperature  98.2, heart rate 78.  Respiratory rate 18,  blood pressure 137/70, O2  saturation 100% on room air.  GENERAL:  Well-nourished, well-developed African American female sitting  up in bed comfortably in no apparent distress.  HEENT:  Normocephalic,  atraumatic.  Moist oral mucosa.  No thrush, erythema or postnasal drip.  Eyes - anicteric.  Extraocular muscles are intact.  Pupils are equal and  reactive to light and accommodation.  CARDIOVASCULAR:  S1-S2 normal, regular rate and rhythm.  No murmurs,  rubs or gallops.   REVIEW OF SYSTEMS:  Air entry is bilaterally equal.  No rales, rhonchi  or wheezes appreciated.  ABDOMEN:  Soft, nontender, nondistended.  Positive bowel sounds; no organomegaly.  EXTREMITIES:  No cyanosis,  clubbing or edema.  Positive bilateral dorsalis pedis.  CNS:  Alert,  oriented x3.  Cranial  nerves II-XII grossly intact. Power,  sensation,  reflexes bilaterally symmetrical.  NECK:  Supple.  Good range of motion.  No thyromegaly.  No carotid bruits.  Neck veins appear to be within  normal limits.   LABORATORY TESTS:  EKG reviewed by US demonstrates normal sinus rhythm.  White blood cell count 4700, hemoglobin 12.3, hematocrit 35.1, platelet  count 209, polymorphs 54, sodium 140, potassium 3.7, chloride 105, BUN  17, creatinine 1.2, glucose 101, calcium 1.19, myoglobin 57.9, CK-MB  1.0, troponin less than 0.05.  Chest x-ray demonstrates cardiomegaly.   ASSESSMENT AND PLAN:  1. Chest pain.  The patient has zero risk factors save for age of  70.      In fact her family history if anything gives her lower low risk.      Her  Jennifer Calhoun is 48 with no significant medical problems whatsoever.      The patient has social risks either.  The chest pain itself I would      grade as a type C and relatively atypical.  She is an active lady.      Chest pain is not related to exertion or rest and I do not believe      at this time, she has any ongoing ischemia or acute coronary      syndrome.  The EKG as well as her cardiac enzymes are within normal      limits.  Both have been reviewed by myself and I have also reviewed      her chest x-ray.  I have recommended that she follow-up with her      primary care physician. She already has an appointment scheduled      for Tuesday, May 25 and possible outpatient stress test just as a      screening test based on her age alone.  I have discussed this with      her and she is of understanding and agrees with the plan.  In      regards to the lightheadedness and dizziness, again, it would be      very unlikely that she has cerebrovascular disease.  She has no      risk factors for the same.  There is no history provided of any      focal deficits and more likely than not, I believe this is an inner      air problem as she has had this occur to  her in  the  past as well.      Again, I have recommended that she follow-up with her primary care      physician and she has expressed understanding and will followup as      instructed.      Manus Gunning, MD  Electronically Signed     SP/MEDQ  D:  04/13/2009  T:  04/13/2009  Job:  [id number]

## 2011-04-08 NOTE — Cardiovascular Report (Signed)
NAME:  SHAKILA, MAK NO.:  [id number]   MEDICAL RECORD NO.:  [id number]          PATIENT TYPE:  OBV   LOCATION:  2807                         FACILITY:  [facility]   PHYSICIAN:  Georga Hacking, M.D.DATE OF BIRTH:  06-Dec-1940   DATE OF PROCEDURE:  05/07/2009  DATE OF DISCHARGE:                            CARDIAC CATHETERIZATION   HISTORY:  The patient is a 70 year old black female who presented with  pressure-type pain 1 month ago and then presented with chest pain with T-  wave inversions in the anterior leads V1-V3 as well as mildly positive  troponins.  Pain was atypical, however.   PROCEDURE:  Left heart catheterization with coronary angiograms and left  ventriculogram.   PROCEDURE:  The patient tolerated the procedure well without  complications.  The right femoral artery was entered using a single  anterior needle wall stick after Xylocaine anesthesia.  The arteries  were injected through 6-French catheters and a 30 mL ventriculogram was  performed at the end of the procedure.  The sheath was removed following  the procedure.   HEMODYNAMIC DATA:  Aorta post contrast 164/77, LV post contrast 164/14-  20.   ANGIOGRAPHIC DATA:  Left ventriculogram performed in the 30-degree RAO  projection.  The aortic valve is normal.  The mitral valve appeared  normal.  Left ventricle was normal in size.  The estimated ejection  fraction was around 50%, lower limits of normal.  There were no wall  motion abnormalities noted.  The coronary arteries arise and distribute  normally.  There is no coronary calcification noted.  The system is left  dominant.  The left main coronary artery is normal.  The left anterior  descending is a large vessel supplying the apex and does not contain any  significant obstructive stenoses.  Circumflex is a dominant vessel and  does not contain any significant obstructive stenoses.  The right  coronary artery is a nondominant vessel which  contains no significant  stenoses.   IMPRESSION:  1. No significant obstructive coronary artery disease.  2. Lower limits of normal left ventricular systolic function.   RECOMMENDATIONS:  Continued medical therapy, evaluation of LV function  and treat hypertension.      Georga Hacking, M.D.  Electronically Signed     WST/MEDQ  D:  05/07/2009  T:  05/07/2009  Job:  [id number]   cc:   [facility]

## 2011-04-08 NOTE — H&P (Signed)
NAMEDAYJA, LOVERIDGE NO.:  [id number]   MEDICAL RECORD NO.:  [id number]          PATIENT TYPE:  OBV   LOCATION:  2905                         FACILITY:  [facility]   PHYSICIAN:  Leighton Roach McDiarmid, M.D.DATE OF BIRTH:  12-16-1940   DATE OF ADMISSION:  05/05/2009  DATE OF DISCHARGE:                              HISTORY & PHYSICAL   PRIMARY CARE PHYSICIAN:  Dr. Debroah Loop at [facility].   CHIEF COMPLAINT:  Chest pain.   HISTORY OF PRESENT ILLNESS:  Ms. Ahniyah Giancola is a very pleasant 70-  year-old African American female complaining of chest pain that started  this morning, midsternal with no radiation, described as sharp and  constant with a low level that shows itself with deep inspiration and  when she moves her arms.  She denies trauma, upper respiratory  infection signs or symptoms, cough, and heavy lifting.  She was given  nitroglycerin in the emergency department that relieved her chest pain.  Because the patient had some chest pain with inspiration and a slightly  elevated D-dimer at 0.55, she did undergo a chest CT that was negative  for pulmonary embolus.  Her EKG was within normal limits in the  emergency department.  Please note that she was seen in the ED in May  2010 for chest pain, evaluated and sent home with plans for an  outpatient stress test that has not been done.   PAST MEDICAL HISTORY:  None.   PAST SURGICAL HISTORY:  Partial removal of small intestine secondary to  hernia with hernia repair and hysterectomy.   MEDICATIONS:  Vitamin D 50,000 unit weekly.   ALLERGIES:  PENICILLIN.   SOCIAL HISTORY:  She lives alone in [place].  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  Tobacco, she has a history of 1-pack per  day x10 years but stopped in the 1970's.  She denies alcohol.  She  denies drug use.  She has mother that is living, 20 years old with  hypertension.  Her father did die in World War II.  She has no siblings.   She denies  fever, chills, weight change, headache, cough, dyspnea,  wheezing, sputum, nausea, vomiting, diarrhea, weakness, dizziness, heat  and cold intolerance.  She does endorse chest pain as above.   PHYSICAL EXAMINATION:  VITAL SIGNS:  Temperature 98.0 degrees, pulse 77-  94, respiratory rate 19-20, blood pressure 128/66, pulse ox 96% on room  air.  GENERAL:  She is alert and oriented x3, in no apparent distress.  HEENT:  Pupils are equally round and reactive to light and  accommodation.  Extraocular muscles are intact.  Oropharynx is pink.  Mucous membranes are moist.  Thyroid is not enlarged.  NECK:  Supple with full range of motion.  CARDIOVASCULAR:  Regular rate and rhythm, 3/6 systolic ejection murmur  that she says is not new.  LUNGS:  Clear to auscultation bilaterally with slight crackles at the  bilateral bases.  ABDOMEN:  Soft, nontender, nondistended.  Bowel sounds x4.  EXTREMITIES:  No cyanosis, clubbing or edema with 2+ pulses.  NEUROLOGIC:  Cranial  nerves II through XII intact.  Strength and  reflexes are normal.   LABORATORY DATA AND STUDIES:  D-dimer is 0.55.  White blood cell count  8.7, hemoglobin 14.2, hematocrit 42.4, platelets 191, 73% neutrophils.  Sodium 139, potassium 5.6, chloride 104, CO2 of 30, BUN 20, creatinine  1.1, glucose 111.  Point-of-care cardiac enzymes are negative.  CT angio  showed:  1. No acute PE or thoracic aortic dissection.  2. Hepatic lesions that are stable since her CT in 2003.  3. A 7 mm right thyroid lesion.   Chest x-ray showed no active disease and mild cardiomegaly.   ASSESSMENT AND PLAN:  This patient is a pleasant 70 year old African  American female with chest pain.  1. Chest pain.  The patient was seen in the emergency department in      May 2010 for the same but never followed up for a stress test on an      outpatient basis.  We will admit her and observe her on telemetry,      cycle cardiac enzymes x2 q.8 h. apart, obtain an EKG  in the morning      as well as fasting lipid panel and A1C.  We do know that the      patient is low risk and her chest pain is atypical so, we will hold      off on calling cardiology at this time.  We will also obtain a 2-D      echo.  2. Hyperkalemia.  The patient was found to have an elevated potassium      of 5.6 on i-STAT.  We will recheck this number in the morning.  She      is having no symptoms and no EKG changes.  3. Thyroid nodule on CT.  We will check a TSH.  The patient does deny      any signs or symptoms of hyper or hypothyroidism.  4. Fluids, electrolytes, and nutrients/Gastrointestinal:  Heart      healthy diet.  Monitor electrolytes as above.  5. Prophylaxis:  Heparin and PPI.  6. Disposition:  Pending workup.      Helane Rima, MD  Electronically Signed      Leighton Roach McDiarmid, M.D.  Electronically Signed    EW/MEDQ  D:  05/05/2009  T:  05/06/2009  Job:  [id number]

## 2011-04-08 NOTE — Discharge Summary (Signed)
[name], Calhoun NO.:  [id number]   MEDICAL RECORD NO.:  [id number]          PATIENT TYPE:  INP   LOCATION:  2023                         FACILITY:  [facility]   PHYSICIAN:  Leighton Roach McDiarmid, M.D.DATE OF BIRTH:  23-Dec-1940   DATE OF ADMISSION:  05/05/2009  DATE OF DISCHARGE:  05/07/2009                               DISCHARGE SUMMARY   PRIMARY CARE PHYSICIAN:  Dr. Drucie Opitz in [facility].   DISCHARGE DIAGNOSES:  1. Atypical chest pain.  2. Hyperlipidemia.  3. Hyperthyroidism.  4. Hypertension.   DISCHARGE MEDICATIONS:  1. Aspirin 81 mg by mouth daily.  2. Pravastatin 40 mg by mouth daily.  3. Lisinopril 20 mg by mouth daily.   LABORATORY DATA:  TSH 0.344, hemoglobin A1c 5.7, cholesterol 226,  triglyceride 28, HDL 71, LDL 149.  CMP within normal limit.  CBC within  normal limit.  D-dimer 0.55.  Troponin trend, point of care cardiac  enzymes negative, to 0.13 to 0.11 to 0.13.   IMAGING:  CT angio.  Impression,  1. Negative for acute PE or thoracic aorta dissection.  2. Stable hepatic cyst.  3. A 7-mm right thyroid lesion.   BRIEF HOSPITAL COURSE:  Jennifer Calhoun is a 70 year old female that  presented to the emergency department with chest pain.  Please see H and  P for further details.  1. Chest pain.  We greatly appreciate Cardiology following with this      patient and expertise.  This patient presenting with atypical chest      pain did have an episode of chest pain on the night of her      admission in conjunction with slightly elevated troponins and T-      wave inversions on anterior leads, V1 through V3.  She was placed      on heparin drip, nitroglycerin drip, Plavix, morphine, and aspirin      while awaiting heart catheterization, which was done the following      Monday.  Cardiac catheterization showed no significant obstructive      coronary artery disease and lower limits of normal left ventricular      systolic function.  The  patient was also found to have 4-5 second      sinus pause.  During her episode of chest pain, doctor had given      her a dose of nitroglycerin.  She was symptomatic at that point and      this resolved spontaneously.  The patient was found to have no      further episodes while hospitalized.  The patient was found to 3/6      systolic ejection murmur; however, a 2-D echo on May 06, 2009 did      not show any significant valvular stenosis or regurg.  The patient      states that she has had this murmur for many years.  2. Hyperlipidemia.  Please see the patient's lipid panel above.  She      was discharged on pravastatin.  She was concerned about medication      cost.  3. Hypertension.  The patient did have slightly elevated blood      pressure prior to discharge and was discharged on lisinopril again      because it was on the list at [facility] and because this medication      would be beneficial for her.  She was not placed on beta-blocker      secondary to the above 4-5 second sinus pause the night of      admission.  4. Hyperthyroidism.  The patient was noted to have an incidental      finding of 7 mm right thyroid lesion on CT angio.  TSH was obtained      and was slightly low at 0.344.  This was not worked up while the      patient was in the hospital as she was eager to go home and said      that this could be followed up on an outpatient basis.  The patient      did show no signs or symptoms of hyperthyroidism including weight      loss, tachycardia, skin changes, etc.  The patient will follow up      with her physician, Dr. Debroah Loop in 1-2 weeks.   FOLLOWUP ISSUES:  1. Follow up of her blood pressure after starting lisinopril.  2. Follow up TSH and thyroid nodule found on CT.  3. Follow up lipid panel in 3-6 months.      Helane Rima, MD  Electronically Signed      Leighton Roach McDiarmid, M.D.  Electronically Signed    EW/MEDQ  D:  05/07/2009  T:  05/08/2009  Job:   [id number]

## 2011-04-11 NOTE — H&P (Signed)
[place]  Patient:    Jennifer Calhoun, Jennifer Calhoun Visit Number: [id number] MRN: [id number]          Service Type: SUR Location: 4W [id number] Attending Physician:  Dominica Severin Dictated by:   Sandria Bales. Ezzard Standing, M.D. Admit Date:  04/27/2002   CC:         Dario Guardian, M.D.  Anselmo Rod, M.D.  Lorne Skeens. Hoxworth, M.D.   History and Physical  DATE OF BIRTH:  [birth year]  HISTORY OF ILLNESS:  The patient is a 70 year old white female who sees Dr. Merri Brunette as her primary medical doctor, Dr. Charna Elizabeth from a GI standpoint, who has had a history of recurrent bowel obstructions.  She had a total abdominal hysterectomy in 1988 for benign disease.  She then had a right femoral hernia and a laparotomy for small-bowel resection which was of strangulated small bowel by Dr. Joanne Gavel in December of 2000.  Since her small-bowel resection, she has had several admissions for evaluations for partial bowel obstruction, her most recent two were in December of 2002 by Dr. Abigail Miyamoto and then in May of 2003 by Dr. Glenna Fellows.  She actually saw Dr. Johna Sheriff like on June 30, or first of July for some vague abdominal pain.  He obtained a small-bowel series at [facility] which the report in the computer is negative.  She re-presented on the evening of July 3, and actually it is early in the morning of July 4, when she has had a CT scan reviewed with Dr. Hulan Saas which shows evidence of a partial bowel obstruction with dilated loops of small bowel, decompressed distal bowel and a dilated stomach.  There was no tumor or mass suggested on the CT scan.  ALLERGIES:  She has allergies she says to PENICILLIN, but I do not think she is allergic to anything.  MEDICATIONS:  Her only medicine is occasional Claritin.  REVIEW OF SYSTEMS:  Pulmonary: She does not smoke cigarettes.  Cardiac: No history of heart disease or chest pain.  Gastrointestinal:  She has been followed by Dr. Charna Elizabeth and has a diagnosis of irritable bowel syndrome and diverticulosis.  She had a colonoscopy by Dr. Charna Elizabeth in May of 2002 and there is just seen scattered diverticular disease, a sessile polyp; no other mass or lesion.  Urologic: No history of kidney stones or kidney infections.  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  PHYSICAL EXAMINATION:  VITAL SIGNS:  Her temperature is 97.0, blood pressure of 122/64, pulse of 68, respirations of 20.  HEENT:  Unremarkable.  NECK:  Supple; I feel no mass and no thyromegaly.  LUNGS:  Clear to auscultation.  HEART:  Regular rate and rhythm without murmur or rub.  BREASTS:  Symmetrical without mass.  ABDOMEN:  Mildly distended.  She has a well-healed lower midline incision and a right groin incision, but I feel no abdominal wall herniae.  She has no tenderness, no guarding and no rebound.  EXTREMITIES:  She has good strength in all four extremities.  NEUROLOGICALLY:  Grossly intact.  LABORATORY DATA:  The labs that I have show a sodium of 125, potassium 4.2, chloride of 88, her CO2 is 29, glucose of 137.  Her WBC is 7300, hemoglobin 13.1, hematocrit 39.  Her lipase is 23.  Urinalysis was negative.  IMPRESSION:  The patient has recurrent small-bowel obstruction with evidence of a partial bowel obstruction on her current computed  tomography scan.  PLAN:  Admit, place an NG tube, IV fluids, repeat KUBs.  She has had multiple episodes of this and I think eventually it may come to abdominal exploration though she is not acutely ill at this time.  I discussed this plan with her.  I will be in touch with Dr. Johna Sheriff and Dr. Merri Brunette of her admission. Dictated by:   Sandria Bales. Ezzard Standing, M.D. Attending Physician:  Dominica Severin DD:  05/27/02 TD:  05/27/02 Job: [id number]

## 2011-04-11 NOTE — Procedures (Signed)
. [facility]  Patient:    Jennifer Calhoun, Jennifer Calhoun                       MRN: [id number] Proc. Date: 04/15/01 Adm. Date:  [id number] Attending:  Charna Elizabeth CC:         Paulino Rily, M.D.   Procedure Report  DATE OF BIRTH:  1941-08-04.  REFERRING PHYSICIAN:  Paulino Rily, M.D.  PROCEDURE PERFORMED:  Colonoscopy with hot biopsies x 2.  ENDOSCOPIST:  Anselmo Rod, M.D.  INSTRUMENT USED:  Olympus video colonoscope.  INDICATIONS FOR PROCEDURE:  Rectal bleeding in a 70 year old African-American female rule out colonic polyps, masses, hemorrhoids, etc.  PREPROCEDURE PREPARATION:  Informed consent was procured from the patient. The patient was fasted for eight hours prior to the procedure and prepped with a bottle of magnesium citrate and a gallon of NuLytely the night prior to the procedure.  PREPROCEDURE PHYSICAL:  The patient had stable vital signs.  Neck supple. Chest clear to auscultation.  S1, S2 regular.  Abdomen soft with normal abdominal bowel sounds.  DESCRIPTION OF PROCEDURE:  The patient was placed in the left lateral decubitus position and sedated with 50 mg of Demerol and 5 mg of Versed intravenously.  Once the patient was adequately sedated and maintained on low-flow oxygen and continuous cardiac monitoring, the Olympus video colonoscope was advanced from the rectum to the cecum without difficulty. There were a few scattered diverticula throughout the colon early stages formation.  Small internal and external hemorrhoids were noticed.  Patient had a small sessile polyp that was biopsied from the midtransverse colon with a hot biopsy forceps.  The patient has small internal and external hemorrhoids.  IMPRESSION: 1. Early scattered diverticular disease. 2. Small internal and external hemorrhoids. 3. Small sessile polyp hot biopsied from the midtransverse colon. 4. No large masses or polyp seen.  RECOMMENDATIONS: 1. The  patient has been advised to increase the fluid and fiber in the diet. 2. Await pathology results. 3. Outpatient follow-up in the next two weeks. DD:  04/15/01 TD:  04/15/01 Job: [id number]

## 2011-04-11 NOTE — Discharge Summary (Signed)
[place]. [facility]  Patient:    TANASIA, BUDZINSKI Visit Number: [id number] MRN: [id number]          Service Type: SUR Location: 4W [id number] [id number] Attending Physician:  Dominica Severin Dictated by:   Lorne Skeens. Hoxworth, M.D. Admit Date:  04/27/2002 Disc. Date: 04/05/02                             Discharge Summary  DISCHARGE DIAGNOSIS: Mechanical small bowel obstruction.  OPERATIONS/PROCEDURES: None.  HISTORY OF PRESENT ILLNESS: Ms. Tiedt is a 70 year old black female with history of previous partial small bowel obstruction, felt secondary to adhesions. Last hospitalized in December of 2002. She now presents with an eighteen hour history of intermittent mid abdominal pain, crampy in nature, associated with nausea and vomiting and abdominal distention. She had had no bowel movement or flatus in twenty four hours.  PAST MEDICAL HISTORY: 1. Remote TAH and BSO. 2. Repair of incarcerated right femoral hernia. 3. She has been followed for diverticulosis and irritable bowel syndrome. 4. Seasonal allergies.  MEDICATIONS: 1. Claritin p.r.n.  ALLERGIES: PENICILLIN.  SOCIAL HISTORY: See detailed H&P.  FAMILY HISTORY: See detailed H&P.  REVIEW OF SYSTEMS: See detailed H&P.  PHYSICAL EXAM:  VITAL SIGNS: Afebrile. Vital signs all within normal limits.  GENERAL: Uncomfortable appearing well developed black female.  ABDOMEN: This revealed well healed low midline and right groin incisions without hernias. There is mid distention. There is moderate mid abdominal tenderness with slight guarding. Bowel sounds were decreased but present .  LABORATORY: White count elevated at 12.2. Hemoglobin 13.9. Electrolytes unremarkable. Amylase, lipase, and urinalysis all normal.  DIAGNOSTIC STUDIES: Flat and upright abdominal x-rays revealed mid abdominal air fluid level, consistent with early small bowel obstruction.  HOSPITAL COURSE: The patient was admitted and  treated with NG suction, NPO, and IV fluids. On Apr 03, 2002, she was feeling better but still having some pain. She had passed flatus. Flat and upright abdominal x-rays did not show particular improvement at this time. Current treatment was continued. On Apr 04, 2002, she was feeling significantly better and had passed further flatus. Follow-up CBC and chemistries were unremarkable. Flat and upright abdominal x-ray on Apr 04, 2002 had improved and was essentially back to normal. At this time, her NG tube was discontinued and she was begun on a clear liquid diet. She tolerated this well and on Apr 05, 2002, was tolerating fluids without nausea and pain. The abdomen was benign. Soft diet was given prior to discharge, which she tolerated well.  DISCHARGE DISPOSITION: She was sent home on Apr 05, 2002. Instructions are to call or return to the office or hospital immediately for any recurrent symptoms. Dictated by:   Lorne Skeens. Hoxworth, M.D. Attending Physician:  Dominica Severin DD:  04/26/02 TD:  04/27/02 Job: [id number]

## 2011-04-11 NOTE — H&P (Signed)
[place]. [facility]  Patient:    Jennifer Calhoun, Jennifer Calhoun Visit Number: [id number] MRN: [id number]          Service Type: MED Location: [phone] Attending Physician:  Erich Montane Dictated by:   Genene Churn. Love, M.D. Admit Date:  05/30/2002 Discharge Date: 05/31/2002   CC:         Jennifer Calhoun, M.D.  Jennifer Calhoun, M.D.   History and Physical  ADDRESS:  [address] [place], [place] [address]. DATE OF BIRTH:  [birth year].  REASON FOR ADMISSION:  This is one of several [facility] admissions for this 70 year old right-handed black single female from [place], [place], admitted from the emergency room for evaluation of two episodes of difficulty with speech.  HISTORY OF PRESENT ILLNESS:  Jennifer Calhoun has a history of GI surgery in the past and has been admitted in the past by Dr. Magnus Ivan for bowel obstruction.  She was hospitalized from July 3-6, 2003.  This morning she noted some edema in her feet and was getting ready to take a Maxzide.  She was talking with her PCPs office by telephone and noted difficulty in getting her words out.  She was asked to come to Dr. Lonn Georgia office and was seen by Dr. Cliffton Calhoun, who noticed an episode while the patient was in the office of difficulty getting her speech out, lasting approximately seconds to a minute.  There was no associated headache, syncope, seizure, chest pain, or palpitations with this episode.  She has no known history of hypertension, diabetes mellitus, heart disease, stroke, head or neck trauma, cocaine use, migraine, cigarette use, or hormone replacement therapy.  She was sent to the [facility] emergency room.  She denies any history of amaurosis fugax and has not been taking aspirin therapy.  PAST MEDICAL HISTORY:  Significant for ankle edema treated intermittently with Maxzide, bowel obstruction with abdominal pain for which she was admitted  from July 3-6, 2003, at [facility] for scar tissue.  She is status post hernia repair in 2001.  She had a hysterectomy 10 years ago.  MEDICATIONS:  Triamterene/hydrochlorothiazide dosage unknown p.r.n. for edema.  SOCIAL HISTORY:  She is single.  She lives alone.  She has no children.  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  FAMILY HISTORY:  Her mother is 13, living and well, and father died in  World War II.  She has no brothers or sisters.  She has hypertension in maternal great-grandmother and maternal aunt.  She has had diabetes mellitus in maternal grandmother and maternal aunts.  PHYSICAL EXAMINATION:  VITAL SIGNS:  Weight not obtained.  Sitting blood pressure in the right and left arm 140/80, heart rate is 82 and regular.  GENERAL:  A well-developed, pleasant black female in no acute distress.  NECK:  There were no bruits in the supraclavicular region or suprasternal notch region, but she had a large left carotid bruit at the angle of the jaw. Neck was supple.  NEUROLOGIC:  Mental status:  She was alert and oriented x3.  Followed one, two, and three-step commands.  She could name objects.  There was no evidence of an aphasia at this time.  Her cranial nerve exam revealed visual fields full, discs flat, spontaneous venous pulsations seen, extraocular movements full, corneals present, no seventh nerve palsy, hearing intact, air conduction greater than bone conduction.  Tongue midline, uvula midline, gag present. Sternocleidomastoid and trapezius testing normal.  Motor examination:  5/5 strength upper and lower extremities.  Coordination with finger-to-nose, heel-to-shin, rapid alternating movements skills to be normal.  Sensory examination intact to pinprick, touch, position, and vibration testing.  Deep tendon reflexes 1-2+.  Plantar responses downgoing.  HEENT:  Tympanic membranes clear.   Bilateral cataracts.  Head, ears, nose, and throat normal.  CHEST:  Lungs clear.  BREASTS:  Normal without masses.  ABDOMEN:  Bowel sounds normal, status post hysterectomy scar.  EXTREMITIES:  Mild edema in the lower extremities.  LABORATORY DATA:  Sodium 140, potassium 3.5, chloride 104, CO2 content 26, BUN 7, creatinine 0.9, calcium 9.6, glucose 94.  White blood cell count 4800, hemoglobin 12.1, hematocrit 36.4, platelets 241,000.  CK elevated at 390, CK-MB 3.8, relative index 1.0, troponin 0.01.  Chest x-ray:  No evidence of cardiopulmonary disease.  Twelve-lead EKG showed normal sinus rhythm, nonspecific ST-T wave changes.  IMPRESSION: 1. Aphasia, transient, code 384.3. 2. Suspected left brain transient ischemic attack, code 435.9. 3. Left carotid bruit, code 785.9. 4. History of abdominal obstruction with scar tissue.  PLAN:  Admit the patient with aspirin therapy and obtain MRA of the intracranial circulation.  Doppler study of the carotids will be performed in the emergency room.  A CT scan will be performed and consideration of MRI with MRA made. Dictated by:   Genene Churn. Love, M.D. Attending Physician:  Erich Montane DD:  05/30/02 TD:  06/01/02 Job: [id number]
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
## 2011-04-11 NOTE — Discharge Summary (Signed)
[facility]  Patient:    Jennifer Calhoun, Jennifer Calhoun Visit Number: [id number] MRN: [id number]          Service Type: MED Location: [phone] Attending Physician:  Miguel Aschoff Dictated by:   Viviana Simpler, M.D. Admit Date:  11/18/2001 Discharge Date: 11/23/2001   CC:         Abigail Miyamoto, M.D.  Dario Guardian, M.D.   Discharge Summary  DATE OF BIRTH:  04/15/1941  CONSULTS:  Abigail Miyamoto, M.D.  PROCEDURES:  None.  DISCHARGE DIAGNOSES: 1. Partial small bowel obstruction. 2. History of small bowel obstruction on November 21, 1999, status post    incarcerated femoral hernia repair. 3. History of hysterectomy with bilateral salpingo-oophorectomy. 4. Diverticulosis without evidence of diverticulitis.  DISCHARGE MEDICATIONS:  None.  DISCHARGE FOLLOW-UP:  The patient should schedule with Abigail Miyamoto, M.D., for follow-up of MRI to further investigate underlying etiology of recurrent small bowel obstruction.  HISTORY OF PRESENT ILLNESS:  The patient is a 70 year old woman with a history of small bowel obstruction three years ago, who presents with a one-day history of abdominal pain, bloating, and vomiting with her most recent bowel movement one day prior.  Her exam was notable for mild diffuse tenderness of the abdomen accompanied by decreased bowel sounds.  The abdomen revealed no distention and was soft.  The exam was otherwise unremarkable as outlined by Marcelino Duster, M.D., admitting physician.  HOSPITAL COURSE:  Jennifer Calhoun diagnostic evaluation included a CT of the abdomen and pelvis, which revealed a small bowel obstruction confirmed by two-view abdomen.  Bowel rest, nasogastric suction, and symptomatic therapy were administered.  A general surgical consultation was provided by Anselm Pancoast. Zachery Dakins, M.D., followed by Abigail Miyamoto, M.D.  The symptoms improved and the underlying etiology was felt to be adhesions from prior  surgery.  Very mild hypokalemia was repleted appropriately.  By the time of discharge, Jennifer Calhoun was tolerating a diet, bowel movements had returned, and nausea and vomiting had resolved.  She will be discharged in stable condition with resolution of her partial small bowel obstruction.  CT scan of the abdomen and pelvis was reviewed by surgeons.  A worrisome solid nodule in the liver was evident, accompanied by multiple cysts, and comparison to a prior CT scan at [facility] was felt to be important for interpretation of these findings.  The discharge plan at this point is to reimage the liver area with MRI scan as an outpatient as orchestrated by Abigail Miyamoto, M.D.  An involuted cyst of the liver has also been implicated as a partial underlying etiology of this presentation. Dictated by:   Viviana Simpler, M.D. Attending Physician:  Miguel Aschoff DD:  11/23/01 TD:  11/23/01 Job: [id number]

## 2011-04-11 NOTE — Discharge Summary (Signed)
[place]. [facility]  Patient:    Jennifer Calhoun, Jennifer Calhoun Visit Number: [id number] MRN: [id number]          Service Type: MED Location: [phone] Attending Physician:  Erich Montane Dictated by:   Deanna Artis. Sharene Skeans, M.D. Admit Date:  05/30/2002 Discharge Date: 05/31/2002                             Discharge Summary  ADDENDUM  LABORATORY FINDINGS:  A 2-D echocardiogram performed May 31, 2002 showed normal left systolic function with an ejection fraction estimated between 55 and 65%, no left ventricular regional wall abnormalities were noted, left ventricular wall thickness was at the upper limits of normal, the left atrium was mildly dilated, the intra-atrial septum was mildly thickened, no discrete intracardiac source of emboli were identified.  Carotid Doppler study performed in the emergency department May 30, 2002 showed no evidence of plaque in either carotid artery, there was a high bifurcation of the left internal carotid artery, there was no evidence of hemodynamically significant stenosis in internal carotid arteries in the neck. Vertebral artery flow was antegrade.  DISCHARGE MEDICATIONS:  Discharge medications included aspirin 325 mg per day.  RECOMMENDATIONS:  Because of the normal findings at present, we have no recommendation for additional secondary stroke prophylaxis other than use of aspirin.  Followup should be with the primary care physicians. Dictated by:   Deanna Artis. Sharene Skeans, M.D. Attending Physician:  Erich Montane DD:  06/10/02 TD:  06/15/02 Job: [id number]

## 2011-04-11 NOTE — Discharge Summary (Signed)
[place]. [facility]  Patient:    Jennifer Calhoun, Jennifer Calhoun Visit Number: [id number] MRN: [id number]          Service Type: MED Location: [phone] Attending Physician:  Erich Montane Dictated by:   Deanna Artis. Sharene Skeans, M.D. Admit Date:  05/30/2002 Discharge Date: 05/31/2002   CC:         [facility]  Dario Guardian, M.D.  [place] Cliffton Asters, M.D.   Discharge Summary  FINAL DIAGNOSES: 1. Aphasia, 783.4. 2. Left carotid bruits, 785.9. 3. Elevated CPK, unknown etiology.  PROCEDURES PERFORMED: 1. MRI. 2. MRA. 3. 2D echocardiogram. 4. Carotid Doppler.  COMPLICATIONS:  None.  SUMMARY OF THE HOSPITALIZATION:  The patient is a 70 year old right-handed woman admitted by Dr. Avie Echevaria on May 30, 2002. She was admitted with two episodes of difficulty with speech. Further history is detailed in the history and physical examination.  The patient had an MRI of the brain which was normal and showed no evidence of acute or remote strokes. An MRA was also normal. There was a hypoplastic right A1 segment of the right anterior cerebral artery. The anterior cerebral artery was filled best from the left side. There is a fetal origin of the right posterior cerebral artery from the right internal carotid which is also a normal anatomic variant. The left vertebral artery terminates in its PICA branch with a hypoplastic appearing vertebral distal to the origin of the left PICA. There is no sign of intracranial arterial occlusion. The MRA is normal except for the normal anatomic variants.  A 2D echocardiogram was performed, and at the time of this dictation, June 10, 2002, it has not been placed on the chart. Laboratory studies were as follows: White blood cell count 4800, hemoglobin 12.1, hematocrit 36.4, MCV 90.7, platelet count 241,000. Sedimentation rate 28. PT 12.9, INR 0.9. Sodium 140, potassium 3.5, chloride 104, CO2 26, glucose 94, BUN  7, creatinine 0.9, calcium9.6. CK initially was 390 and dropped to 216, CKMB 3.8, relative index 1.0, both normal. Troponin N 10.01. An electrocardiogram showed a normal sinus rhythm with nonspecific T wave abnormalities, with T wave inversion to V1, V2, consistent with anterior septal ischemia. It was for this reason the echocardiogram was done, however, to date, as I mentioned, the result is not back on the chart. We will acquire the result and include it.  The patient was discharged in improved condition without complications aphasia or other focal neurologic deficits. On the day of her discharge her vital signs were as follows:  Temperature 98.2, blood pressure 131/62, resting pulse 64, respirations 20, pulse oximetry 98% on room air. General examination no abnormalities. Neurologic examination, the patient was alert and oriented, follows one, two and three step commands. Names objects. Cranial nerves were normal. Strength was normal. There was a slight essential tremor. No other abnormalities were seen.  FOLLOWUP:  She was to follow up with her primary care physicians. Dictated by:   Deanna Artis. Sharene Skeans, M.D. Attending Physician:  Erich Montane DD:  06/10/02 TD:  06/15/02 Job: [id number]

## 2011-04-11 NOTE — Discharge Summary (Signed)
[place]. [facility]  Patient:    Jennifer Calhoun                        MRN: [id number] Adm. Date:  [id number] Attending:  Andre Lefort CC:         Maris Berger. Pennie Rushing, M.D.                           Discharge Summary  DATE OF BIRTH: [birth year].  HISTORY OF PRESENT ILLNESS:  [name] is a 70 year old black female who sees Dr. Dierdre Forth as her gynecologist.  She has no primary medical doctor.  She was in otherwise good health and started developing nausea and vomiting on Wednesday, November 06, 1999.  By the 14th she was feeling somewhat better. She talked to Dr. Charleen Kirks office who called her in something for nausea.  However,  today she started having increased abdominal pain which got worse at night. She sought help at the emergency room.  She describes the pain as being diffuse, maybe in the periumbilical or epigastric area.  It does not lateralize very well. She had a normal bowel movement this morning and actually feels a little bit better  tonight when I was seeing her.  She denies a history of peptic ulcer disease, liver disease, gallbladder disease or pancreatic disease.  She has had no prior bowel  problems.  She did have a hysterectomy by Dr. Phyllis Ginger in 1990 for benign disease.  PAST MEDICAL HISTORY:  She is allergic to penicillin though she does not remember exactly what this allergy was.  Her current medications include Vioxx that she s getting from a physician at [facility].  She cannot remember his name. She is getting the Vioxx for her shoulders.  She also gets Claritin by Dr. Montrose Callas for allergies.  REVIEW OF SYSTEMS: PULMONARY:  No history of pneumonia and tuberculosis. CARDIAC: No history of heart disease, chest pain.  GASTROINTESTINAL: No history of peptic ulcer disease or liver disease, change in bowel habits.  UROLOGIC: No history of kidney stones or kidney infections.  PHYSICAL  EXAMINATION:  VITAL SIGNS: Temperature is 98.4, pulse is 97, blood pressure 113/69.  She is a  well-nourished black female, alert and cooperative on physical examination.  HEENT: Unremarkable.  NECK:  Her neck is supple without masses or thyromegaly.  LUNGS: The lungs are clear to auscultation.  HEART: The heart has a regular rate and rhythm without murmur or rub.  ABDOMEN:  The abdomen is mildly distended.  She does not have any localized tenderness. I can feel no abdominal mass or organomegaly.  She does have a right femoral hernia.  This hernia is not entirely reducible but is soft and nontender.  I do not feel a left femoral hernia.  RECTAL: Her rectal examination revealed guaiac negative stools.  She has good strength in all four extremities.  LABORATORY DATA:  Her labs show a white blood cell count of 10,600 with hemoglobin of 13.5.  Her KUB shows some dilated small bowel consistent with either an ileus or small bowel obstruction.  IMPRESSION: 1. Small bowel obstruction versus ileus versus gastroenteritis.  Because    of the patients severe pain today, the emergency room planned to admit and    observe her overnight.  If her pain is no better we would consider CT scan    tomorrow. 2. Right femoral hernia.  I do not think that the right femoral hernia is the    cause of her current symptoms though certainly with evidence of partial small    bowel obstruction this has to be considered. 3. Shoulder pain of nonspecific etiology. 4. History of allergies on Claritin. DD:  11/08/99 TD:  11/08/99 Job: [id number]

## 2011-05-15 ENCOUNTER — Inpatient Hospital Stay (HOSPITAL_COMMUNITY)
Admission: EM | Admit: 2011-05-15 | Discharge: 2011-05-20 | DRG: 390 | Disposition: A | Discharge status: Home / Self Care | Payer: Medicare Other | Attending: General Surgery | Admitting: General Surgery

## 2011-05-15 ENCOUNTER — Other Ambulatory Visit: Payer: Self-pay | Admitting: Gastroenterology

## 2011-05-15 ENCOUNTER — Ambulatory Visit
Admission: RE | Admit: 2011-05-15 | Discharge: 2011-05-15 | Disposition: A | Discharge status: Home / Self Care | Payer: Medicare Other | Source: Ambulatory Visit | Attending: Gastroenterology | Admitting: Gastroenterology

## 2011-05-15 ENCOUNTER — Emergency Department (HOSPITAL_COMMUNITY): Payer: Medicare Other

## 2011-05-15 DIAGNOSIS — R109 Unspecified abdominal pain: Secondary | ICD-10-CM

## 2011-05-15 DIAGNOSIS — R112 Nausea with vomiting, unspecified: Secondary | ICD-10-CM | POA: Diagnosis present

## 2011-05-15 DIAGNOSIS — E876 Hypokalemia: Secondary | ICD-10-CM | POA: Diagnosis present

## 2011-05-15 DIAGNOSIS — K56609 Unspecified intestinal obstruction, unspecified as to partial versus complete obstruction: Principal | ICD-10-CM | POA: Diagnosis present

## 2011-05-15 LAB — CBC
Platelets: 314 10*3/uL (ref 150–400)
RBC: 4.36 MIL/uL (ref 3.87–5.11)
WBC: 8.3 10*3/uL (ref 4.0–10.5)

## 2011-05-15 LAB — DIFFERENTIAL
Basophils Absolute: 0 10*3/uL (ref 0.0–0.1)
Basophils Relative: 0 % (ref 0–1)
Eosinophils Absolute: 0 10*3/uL (ref 0.0–0.7)
Neutro Abs: 5.8 10*3/uL (ref 1.7–7.7)
Neutrophils Relative %: 70 % (ref 43–77)

## 2011-05-15 LAB — LACTIC ACID, PLASMA: Lactic Acid, Venous: 1 mmol/L (ref 0.5–2.2)

## 2011-05-15 LAB — URINALYSIS, ROUTINE W REFLEX MICROSCOPIC
Bilirubin Urine: NEGATIVE
Hgb urine dipstick: NEGATIVE
Protein, ur: NEGATIVE mg/dL
Specific Gravity, Urine: >1.046 — ABNORMAL HIGH (ref 1.005–1.030)
Urobilinogen, UA: 0.2 mg/dL (ref 0.0–1.0)

## 2011-05-15 LAB — COMPREHENSIVE METABOLIC PANEL
ALT: 22 U/L (ref 0–35)
Albumin: 3.8 g/dL (ref 3.5–5.2)
Alkaline Phosphatase: 89 U/L (ref 39–117)
GFR calc Af Amer: 47 mL/min — ABNORMAL LOW (ref >60)
Glucose, Bld: 117 mg/dL — ABNORMAL HIGH (ref 70–99)
Potassium: 2.6 mEq/L — CL (ref 3.5–5.1)
Sodium: 124 mEq/L — ABNORMAL LOW (ref 135–145)
Total Protein: 8.2 g/dL (ref 6.0–8.3)

## 2011-05-15 LAB — CK TOTAL AND CKMB (NOT AT ARMC)
CK, MB: 1.7 ng/mL (ref 0.3–4.0)
Relative Index: INVALID (ref 0.0–2.5)
Total CK: 65 U/L (ref 7–177)

## 2011-05-15 LAB — URINE MICROSCOPIC-ADD ON

## 2011-05-15 IMAGING — CT CT ABD-PELV W/ CM
2 of 5 series · 17 of 46 positions shown, 19 images · IV contrast (30CC OMNI 300 & [ID] OMNI 300)
Comparison: Abdomen film of [DATE]

CLINICAL DATA: Mid abdominal pain for 9 days, nausea, vomiting,
history of prior surgery for small bowel obstruction

CT ABDOMEN AND PELVIS WITH CONTRAST
TECHNIQUE: Multidetector CT imaging of the abdomen and pelvis was
performed following the standard protocol during bolus
administration of intravenous contrast.
Contrast: 100 ml [CX]

[Series 2: abdomen w/ · axial · 0.74mm/px · z∈[-370,+10]mm · 14 of 86 slices shown, 16 images]
[im 5/86  soft-tissue]
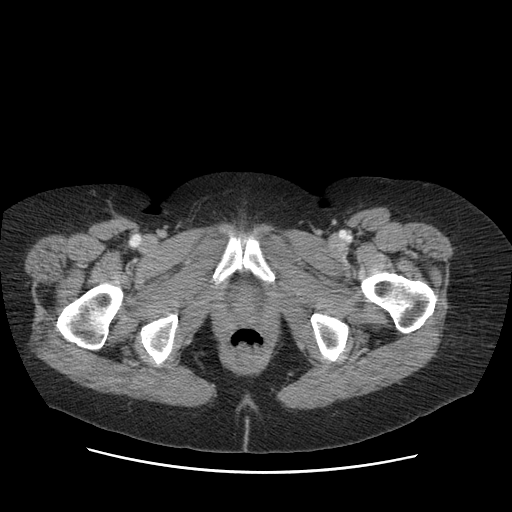
[im 5/86  bone]
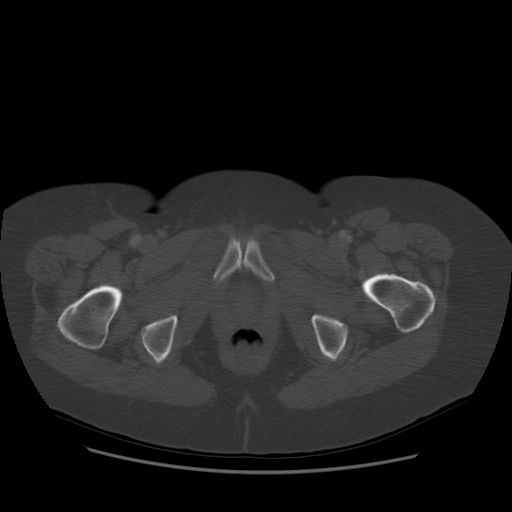
[im 9/86  soft-tissue]
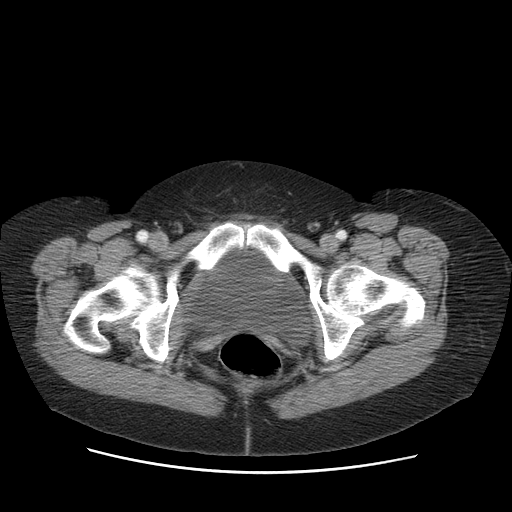
[im 18/86  soft-tissue]
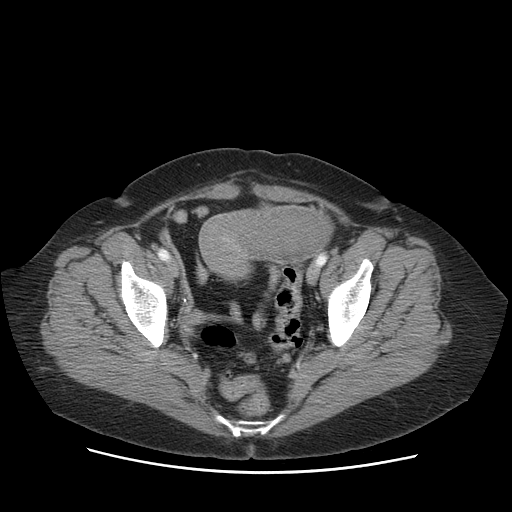
[im 23/86  soft-tissue]
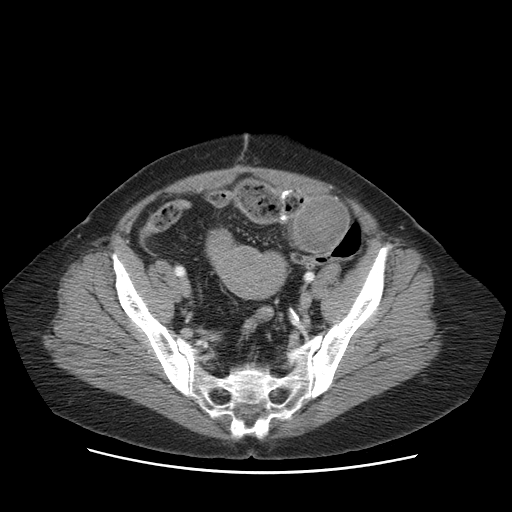
[im 27/86  soft-tissue]
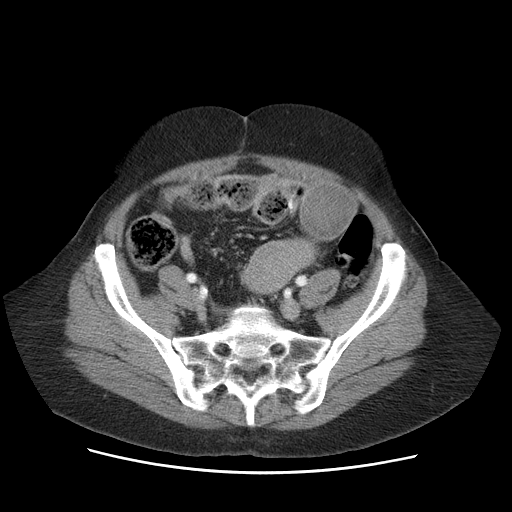
[im 36/86  soft-tissue]
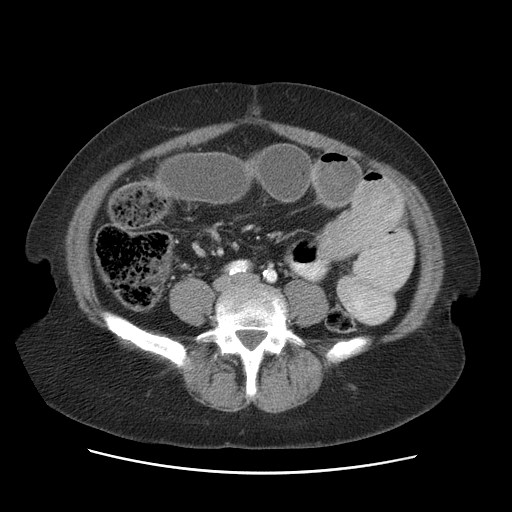
[im 41/86  soft-tissue]
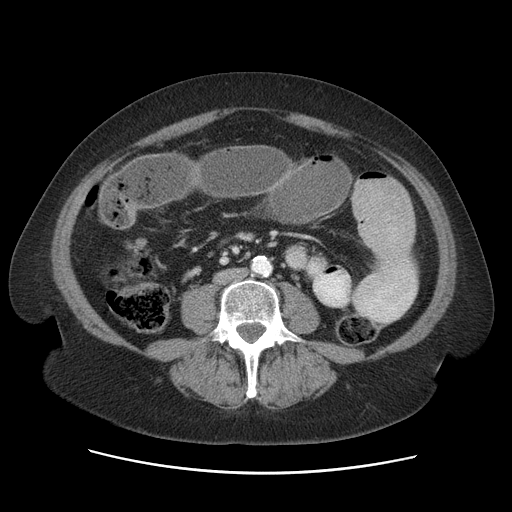
[im 45/86  soft-tissue]
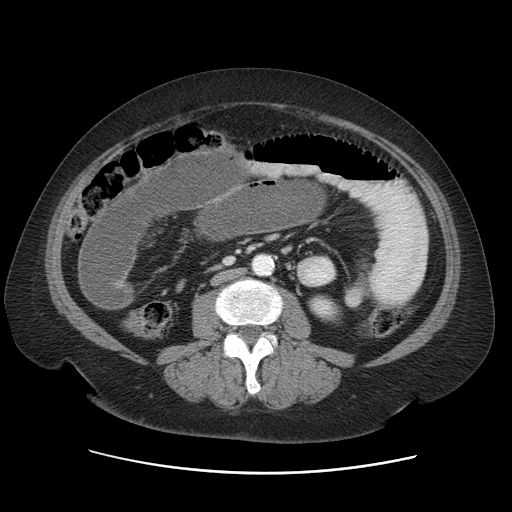
[im 50/86  soft-tissue]
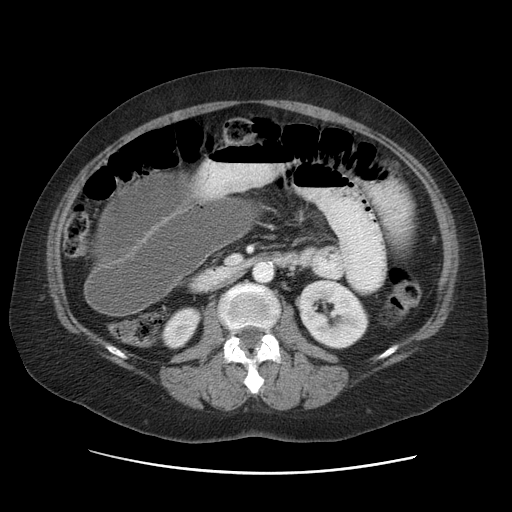
[im 50/86  bone]
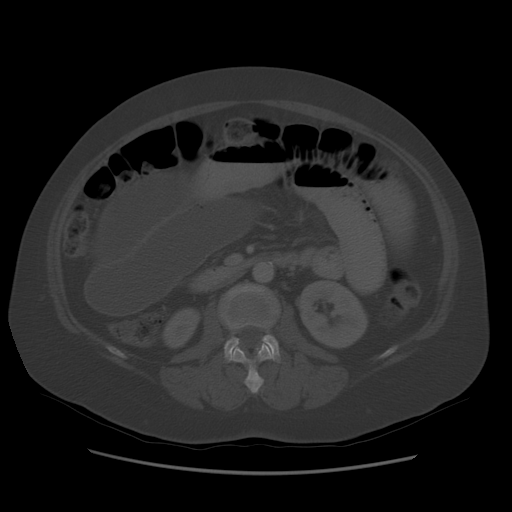
[im 59/86  soft-tissue]
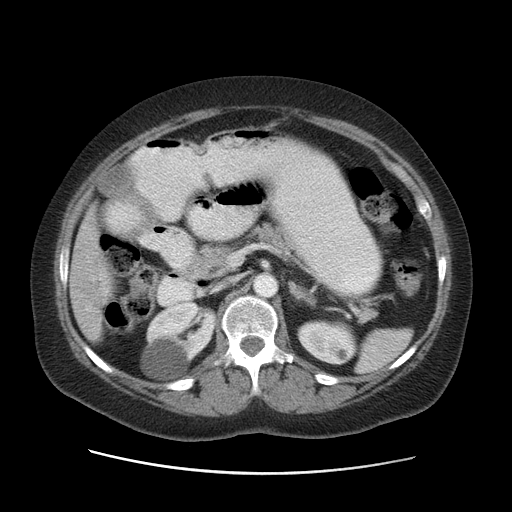
[im 63/86  soft-tissue]
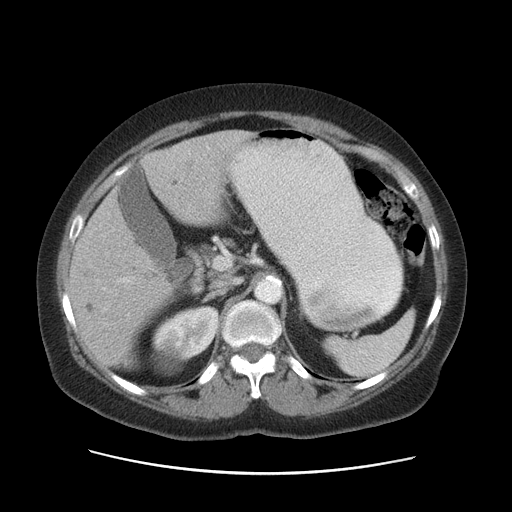
[im 68/86  soft-tissue]
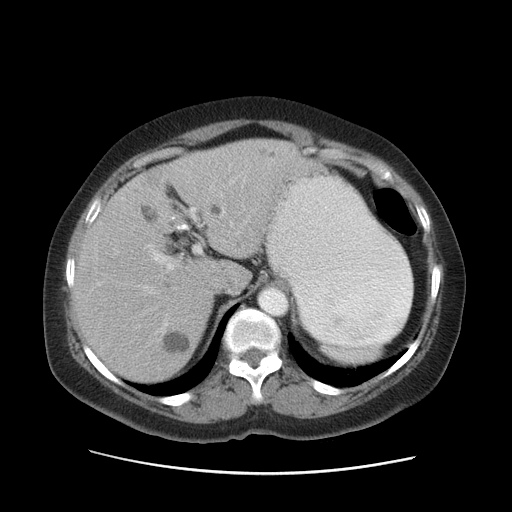
[im 77/86  soft-tissue]
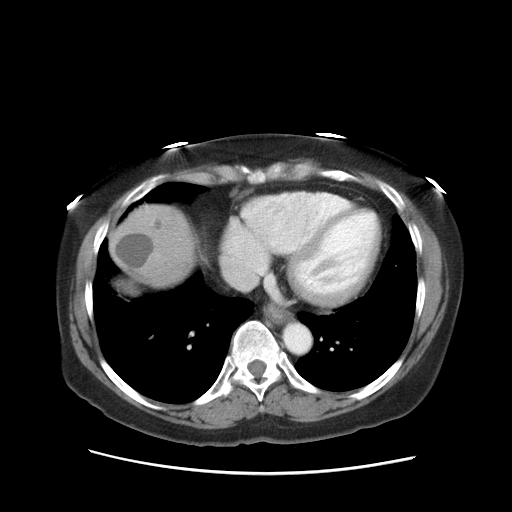
[im 81/86  soft-tissue]
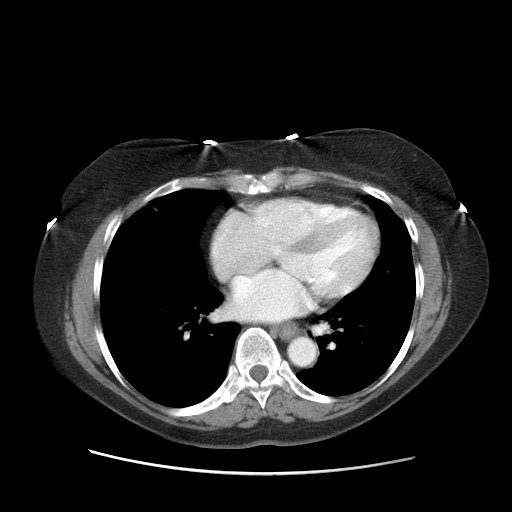

[Series 400: coronal · coronal · 0.90mm/px · 3 of 127 slices shown]
[im 43/127  soft-tissue]
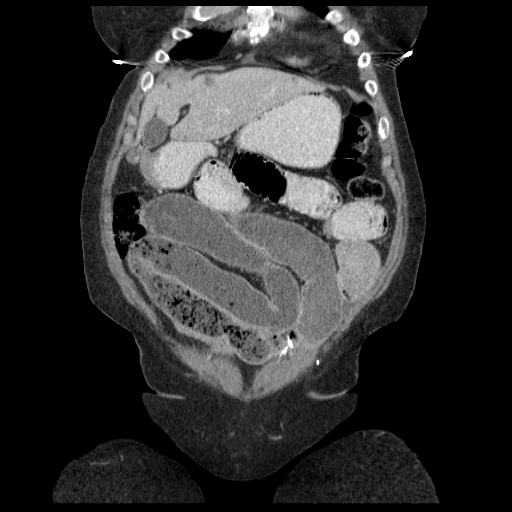
[im 57/127  soft-tissue]
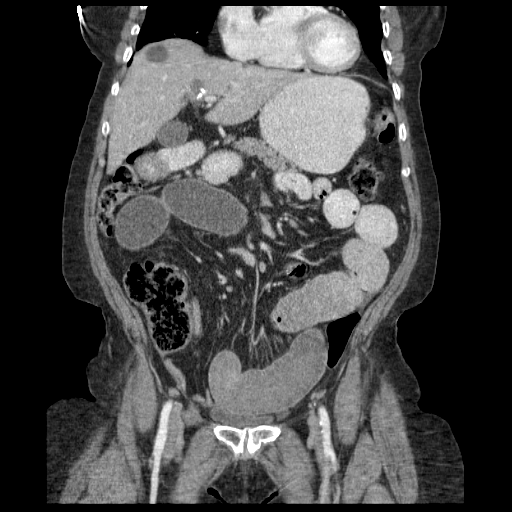
[im 71/127  soft-tissue]
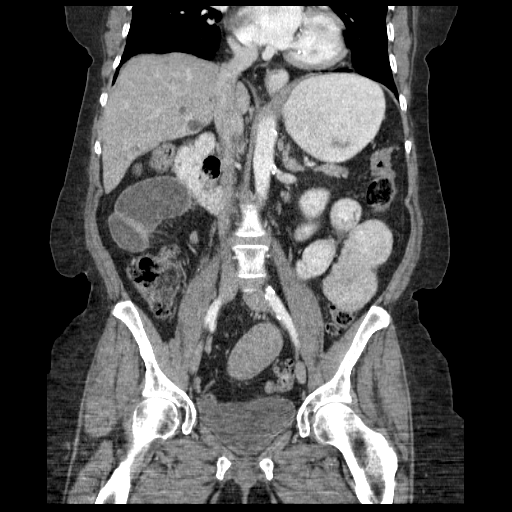

[17 of 46 positions shown; findings below may reference images not displayed]

FINDINGS: The lung bases are clear.  There are multiple well
circumscribed low attenuation lesions within the liver most
consistent with multiple cysts several which are slightly complex.
No calcified gallstones are seen.  The pancreas is normal in size
and the pancreatic duct is not dilated.  The adrenal glands and
spleen are unremarkable.  A significant amount of oral contrast is
noted within the distended stomach.  The kidneys enhance and there
are small renal cysts bilaterally.  No hydronephrosis is seen.  The
aorta is normal in caliber.

However, there are markedly dilated loops of small bowel measuring
up to 38 mm in diameter.  The distal small bowel loops are
decompressed.  There does appear to be an abrupt change in caliber
of the small bowel within the anterior mid pelvis slightly to the
left of midline where surgical sutures are present presumably as
result of prior small bowel surgery.  Therefore the small bowel
obstruction most likely is due to adhesion, with no mass noted and
no hernia evident.

The urinary bladder is unremarkable.  The uterus has been resected.
Multiple rectosigmoid colonic diverticula are noted.  No bony
abnormality is seen.
IMPRESSION: 1.  Partial small-bowel obstruction with apparent change in caliber
within the mid anterior pelvis at the site of prior small bowel
surgery, most likely as a result of adhesion.
2.  Multiple rectosigmoid colonic diverticula.

## 2011-05-15 IMAGING — CR DG ABDOMEN ACUTE W/ 1V CHEST
3 series · 3 of 3 positions shown · non-contrast
Comparison: CT [DATE]

CLINICAL DATA: Abdominal pain and vomiting

ACUTE ABDOMEN SERIES (ABDOMEN 2 VIEW & CHEST 1 VIEW)

[w chest pa]
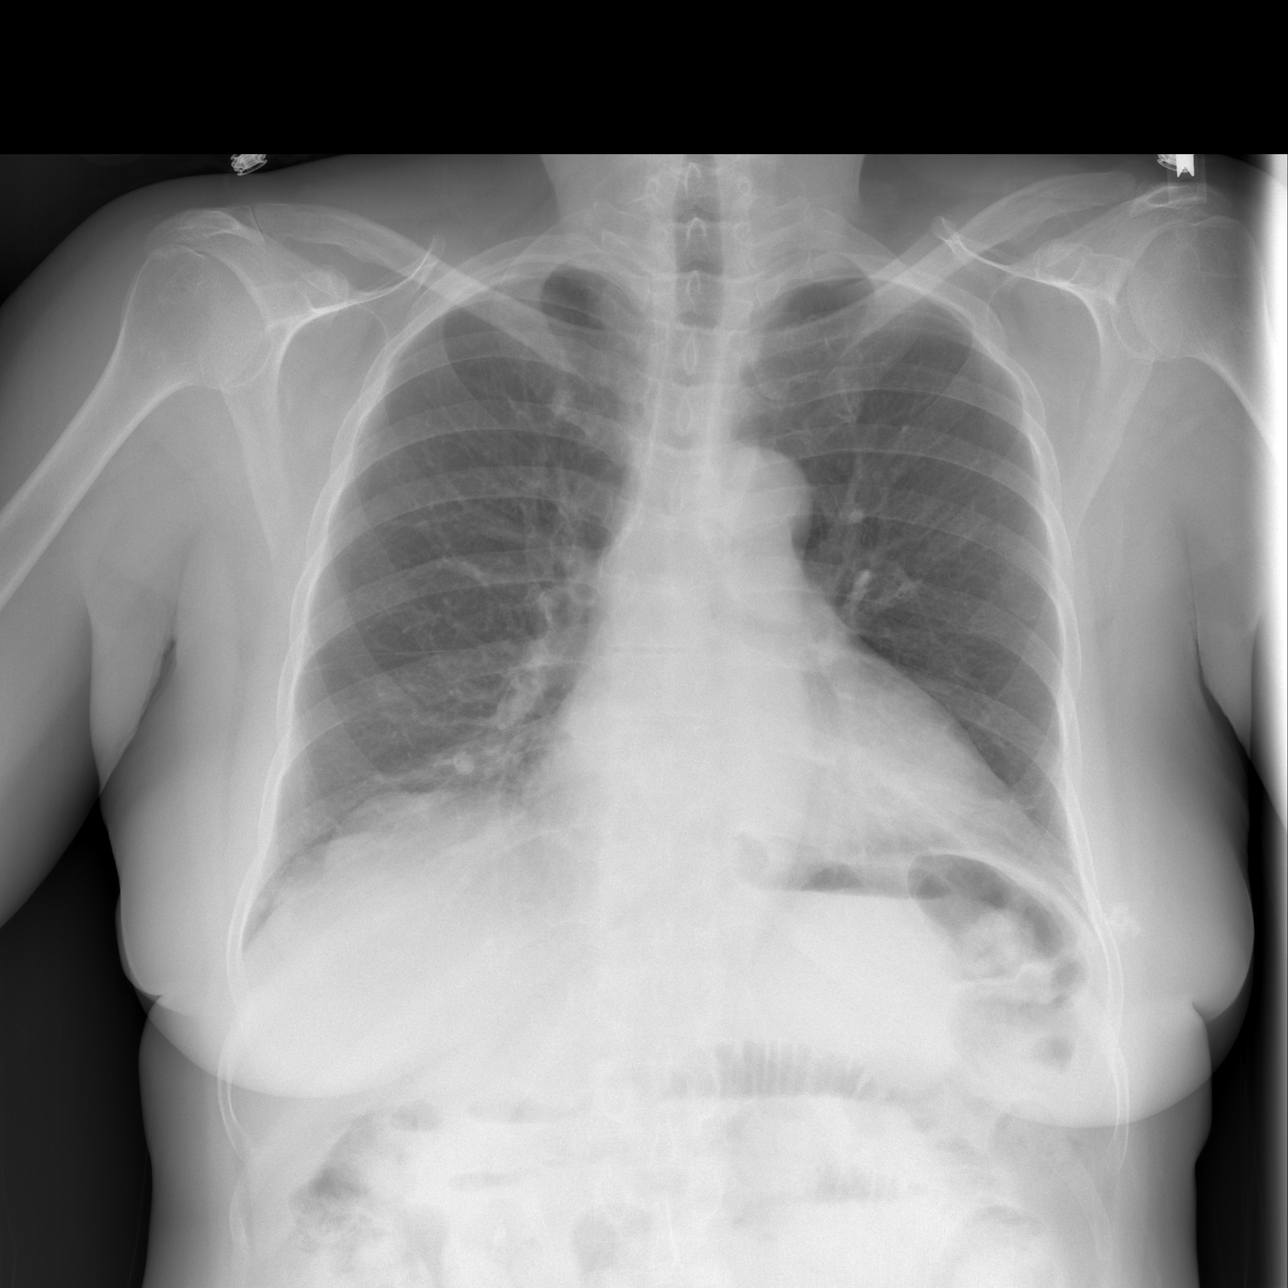

[w abdomen upright *]
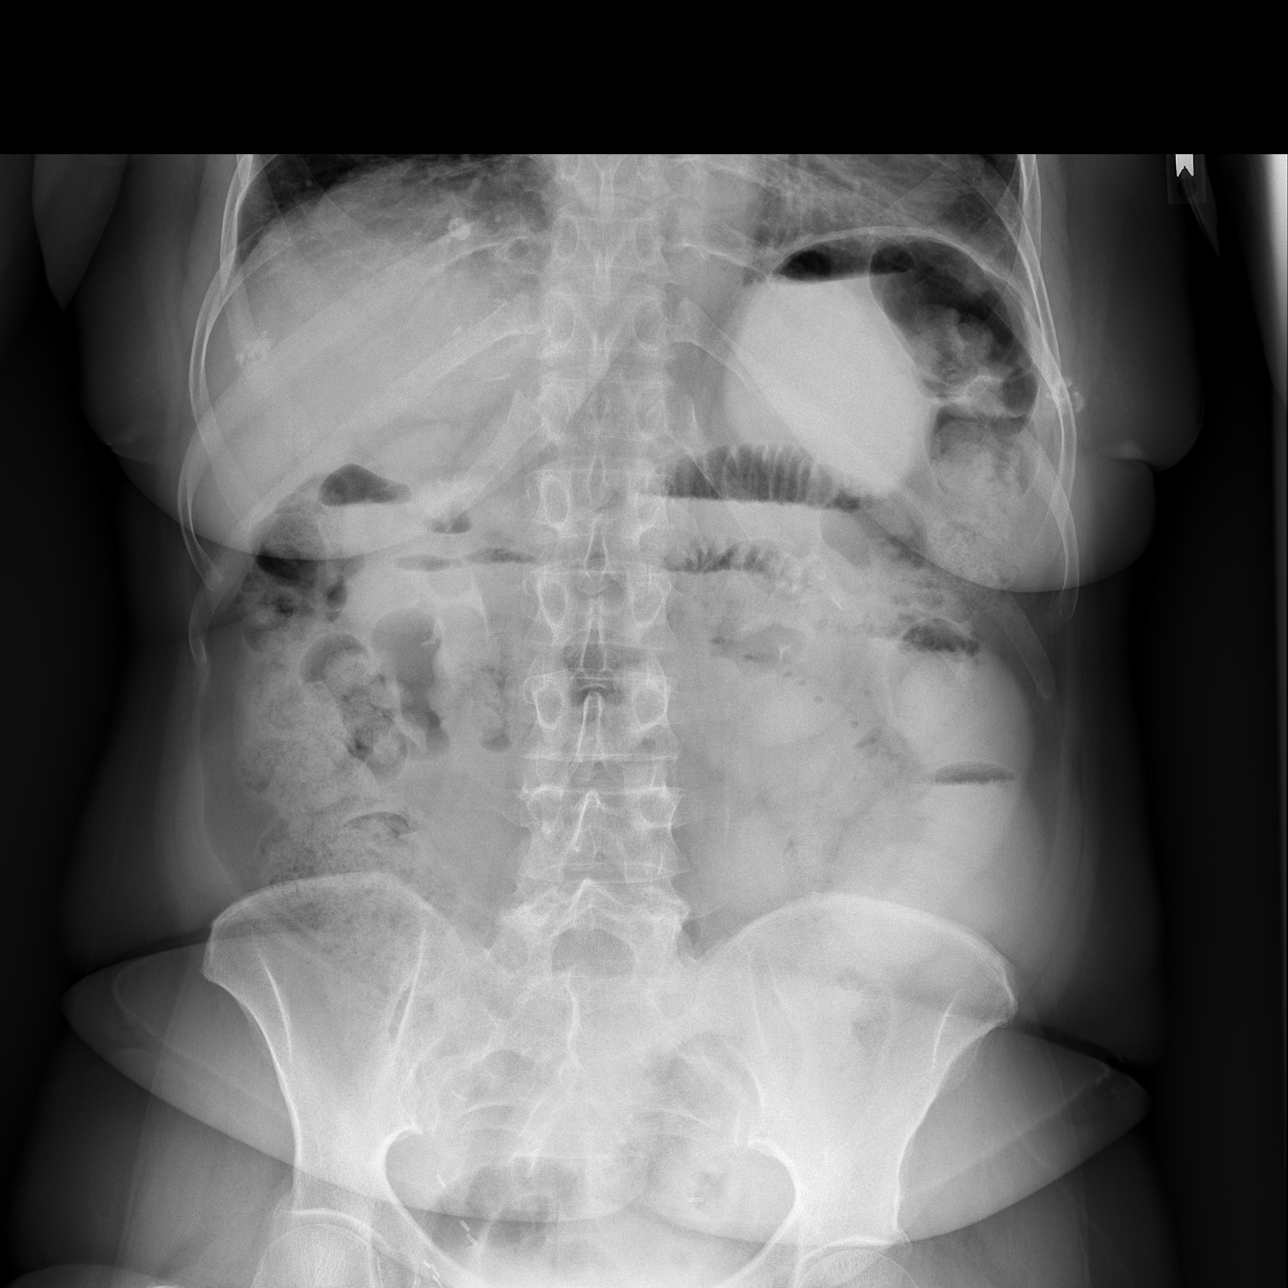

[t abdomen supine]
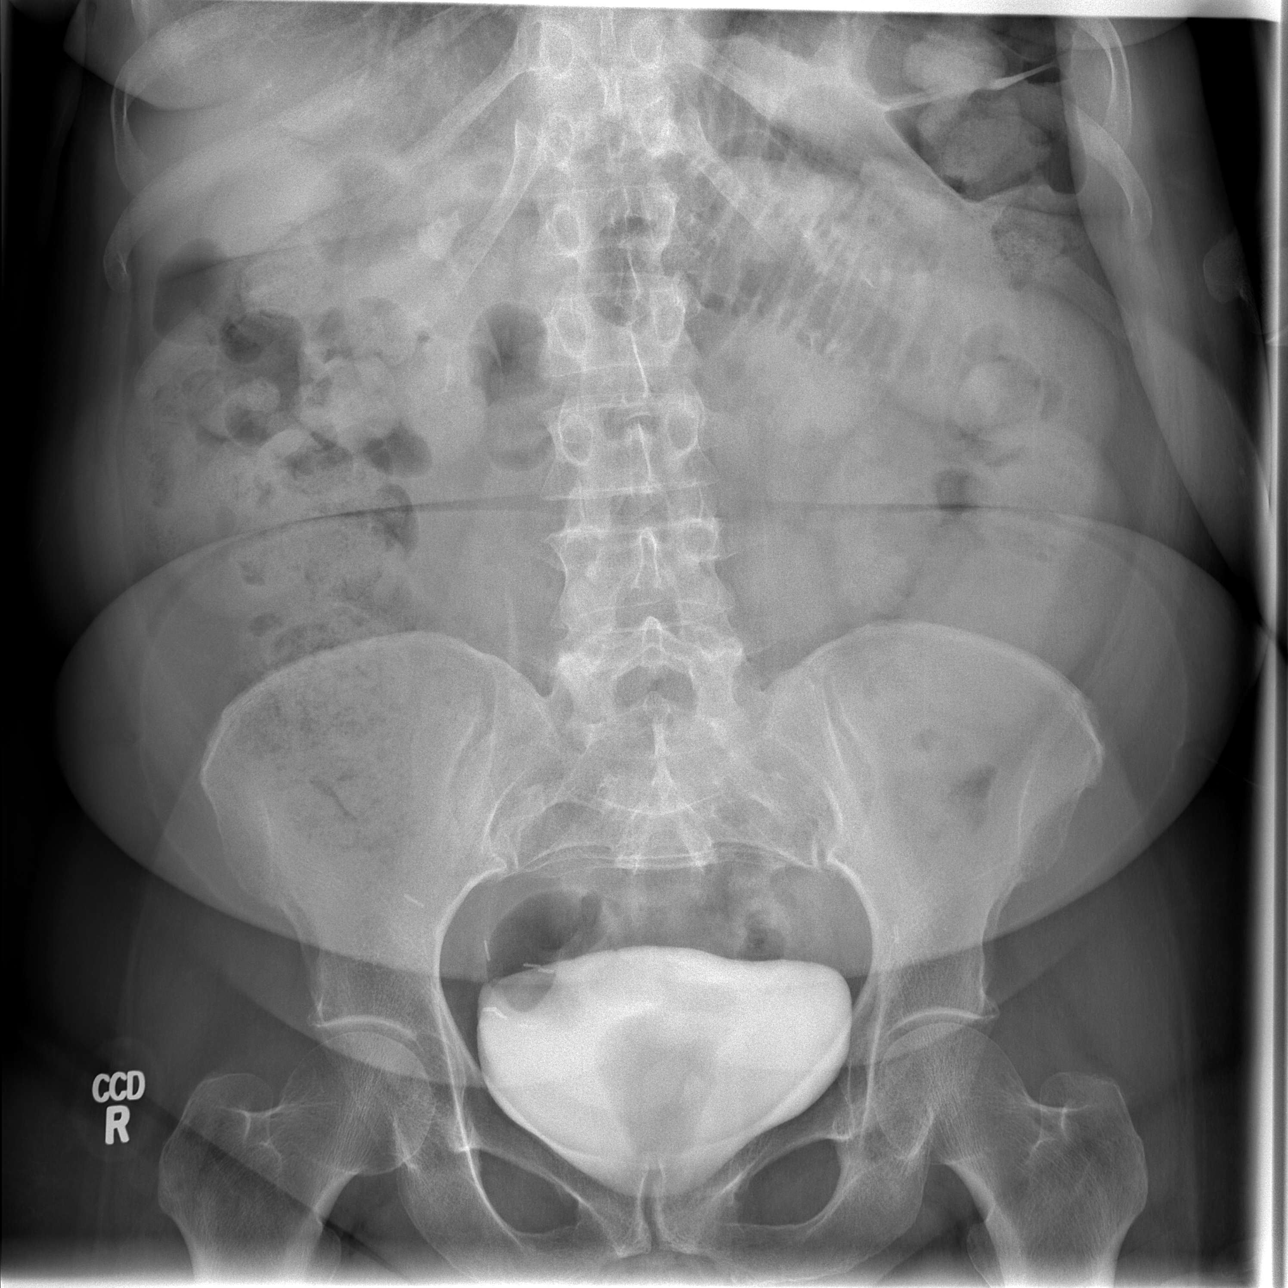

[3 of 3 positions shown; findings below may reference images not displayed]

FINDINGS: Mild right lower lobe atelectasis has improved since the
prior chest x-ray [DATE].  Negative for pneumonia.  Negative
for heart failure.

Dilated small bowel containing air fluid levels.  Contrast in the
stomach and small bowel from recent CT.  Colon is decompressed.
Moderate stool in the right colon.  No free air.
IMPRESSION: Small bowel obstruction pattern as noted on CT.  No free air.

## 2011-05-15 MED ORDER — IOHEXOL 300 MG/ML  SOLN
100.0000 mL | Freq: Once | INTRAMUSCULAR | Status: AC | PRN
  Administered 2011-05-15: 100 mL via INTRAVENOUS

## 2011-05-16 LAB — CBC
HCT: 33.8 % — ABNORMAL LOW (ref 36.0–46.0)
Hemoglobin: 11.6 g/dL — ABNORMAL LOW (ref 12.0–15.0)
MCH: 30.2 pg (ref 26.0–34.0)
MCHC: 34.3 g/dL (ref 30.0–36.0)

## 2011-05-16 LAB — BASIC METABOLIC PANEL
BUN: 18 mg/dL (ref 6–23)
BUN: 9 mg/dL (ref 6–23)
Calcium: 8.7 mg/dL (ref 8.4–10.5)
Creatinine, Ser: 1.1 mg/dL (ref 0.50–1.10)
Creatinine, Ser: 0.97 mg/dL (ref 0.50–1.10)
GFR calc Af Amer: >60 mL/min (ref >60)
GFR calc non Af Amer: 49 mL/min — ABNORMAL LOW (ref >60)
GFR calc non Af Amer: 57 mL/min — ABNORMAL LOW (ref >60)
Glucose, Bld: 147 mg/dL — ABNORMAL HIGH (ref 70–99)

## 2011-05-17 ENCOUNTER — Inpatient Hospital Stay (HOSPITAL_COMMUNITY): Payer: Medicare Other

## 2011-05-17 LAB — BASIC METABOLIC PANEL
CO2: 31 mEq/L (ref 19–32)
Chloride: 100 mEq/L (ref 96–112)
Creatinine, Ser: 0.84 mg/dL (ref 0.50–1.10)
Potassium: 3.2 mEq/L — ABNORMAL LOW (ref 3.5–5.1)

## 2011-05-17 IMAGING — CR DG ABDOMEN 2V
2 series · 2 of 2 positions shown · non-contrast
Comparison: [DATE]

CLINICAL DATA: Small bowel obstruction.

ABDOMEN - 2 VIEW

[w abdomen upright]
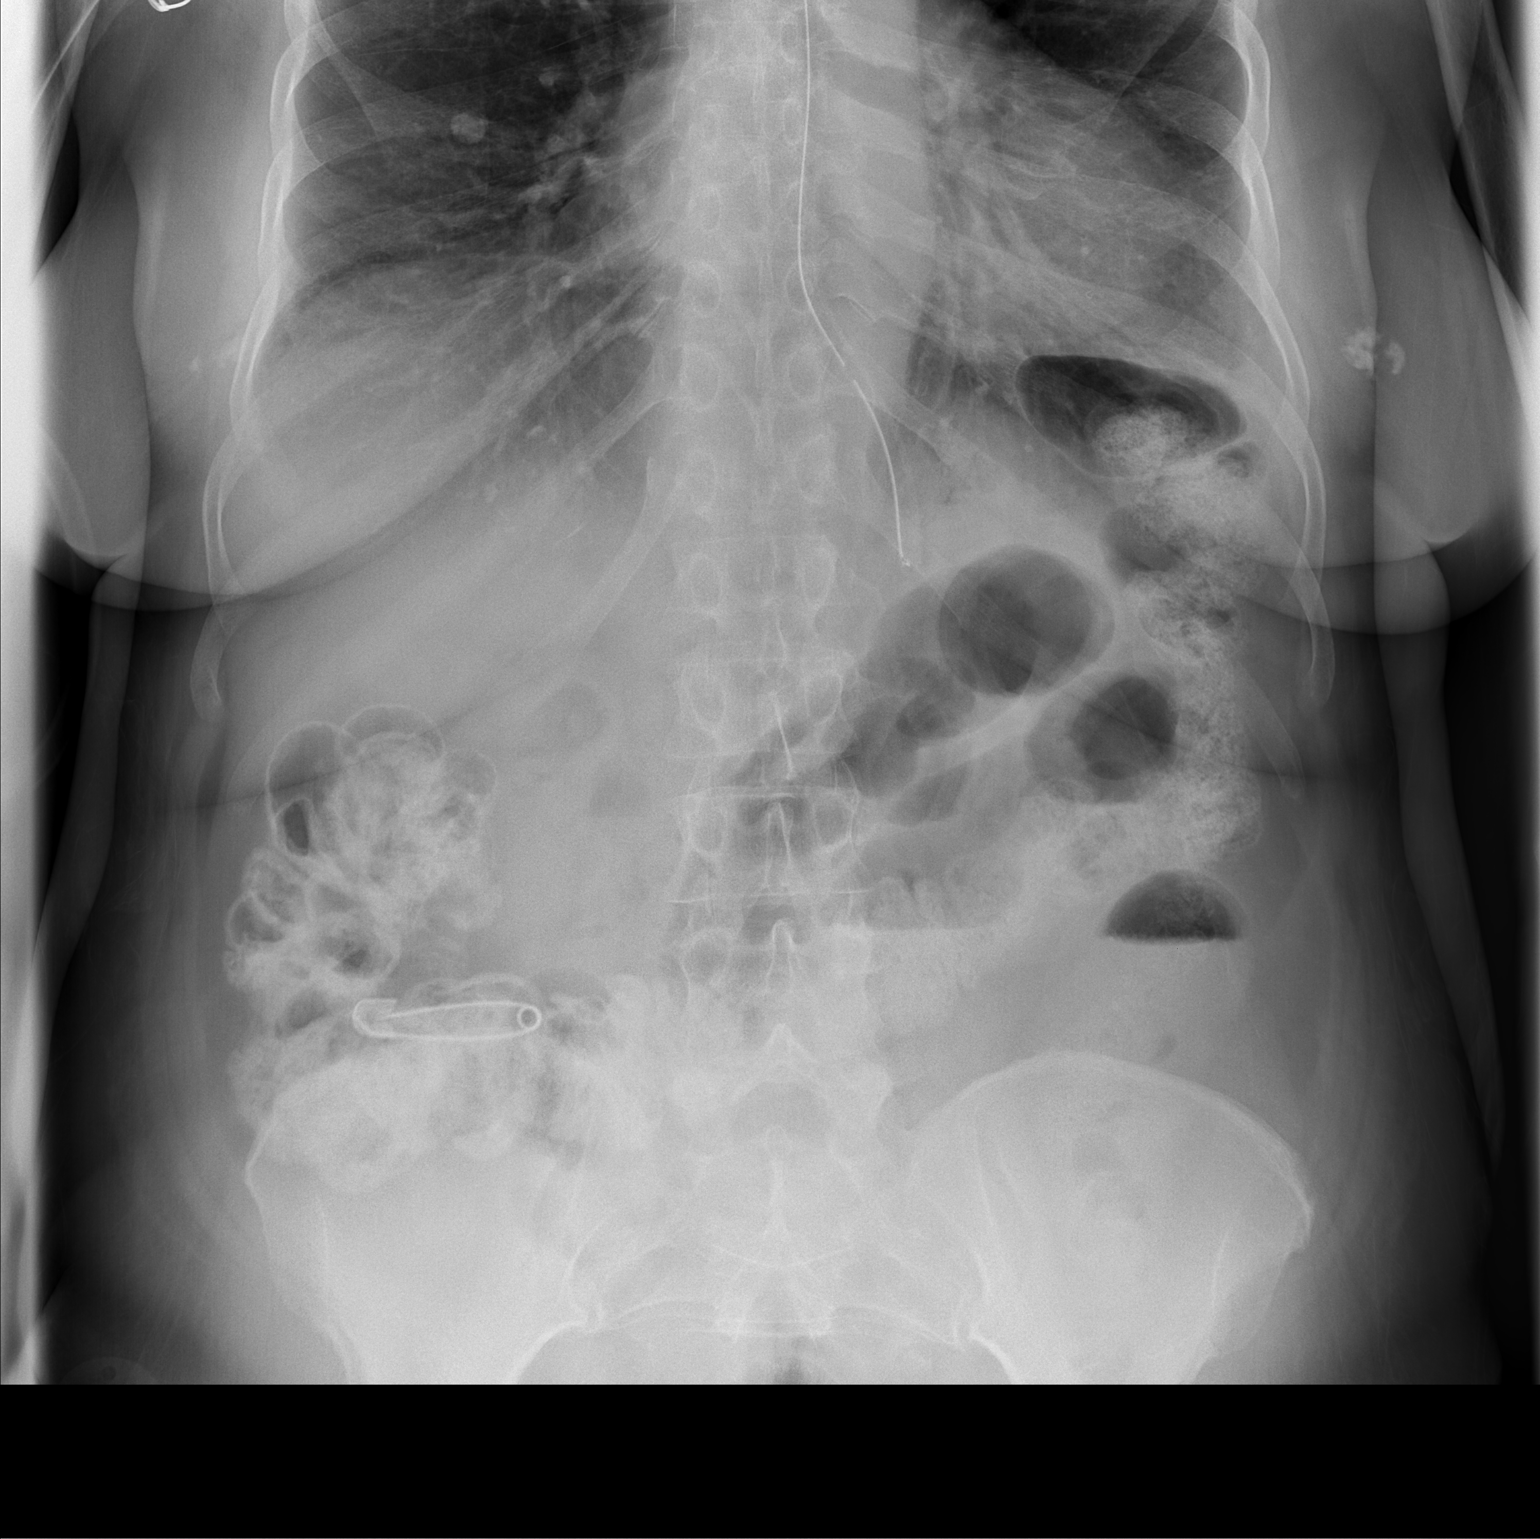

[t abdomen supine]
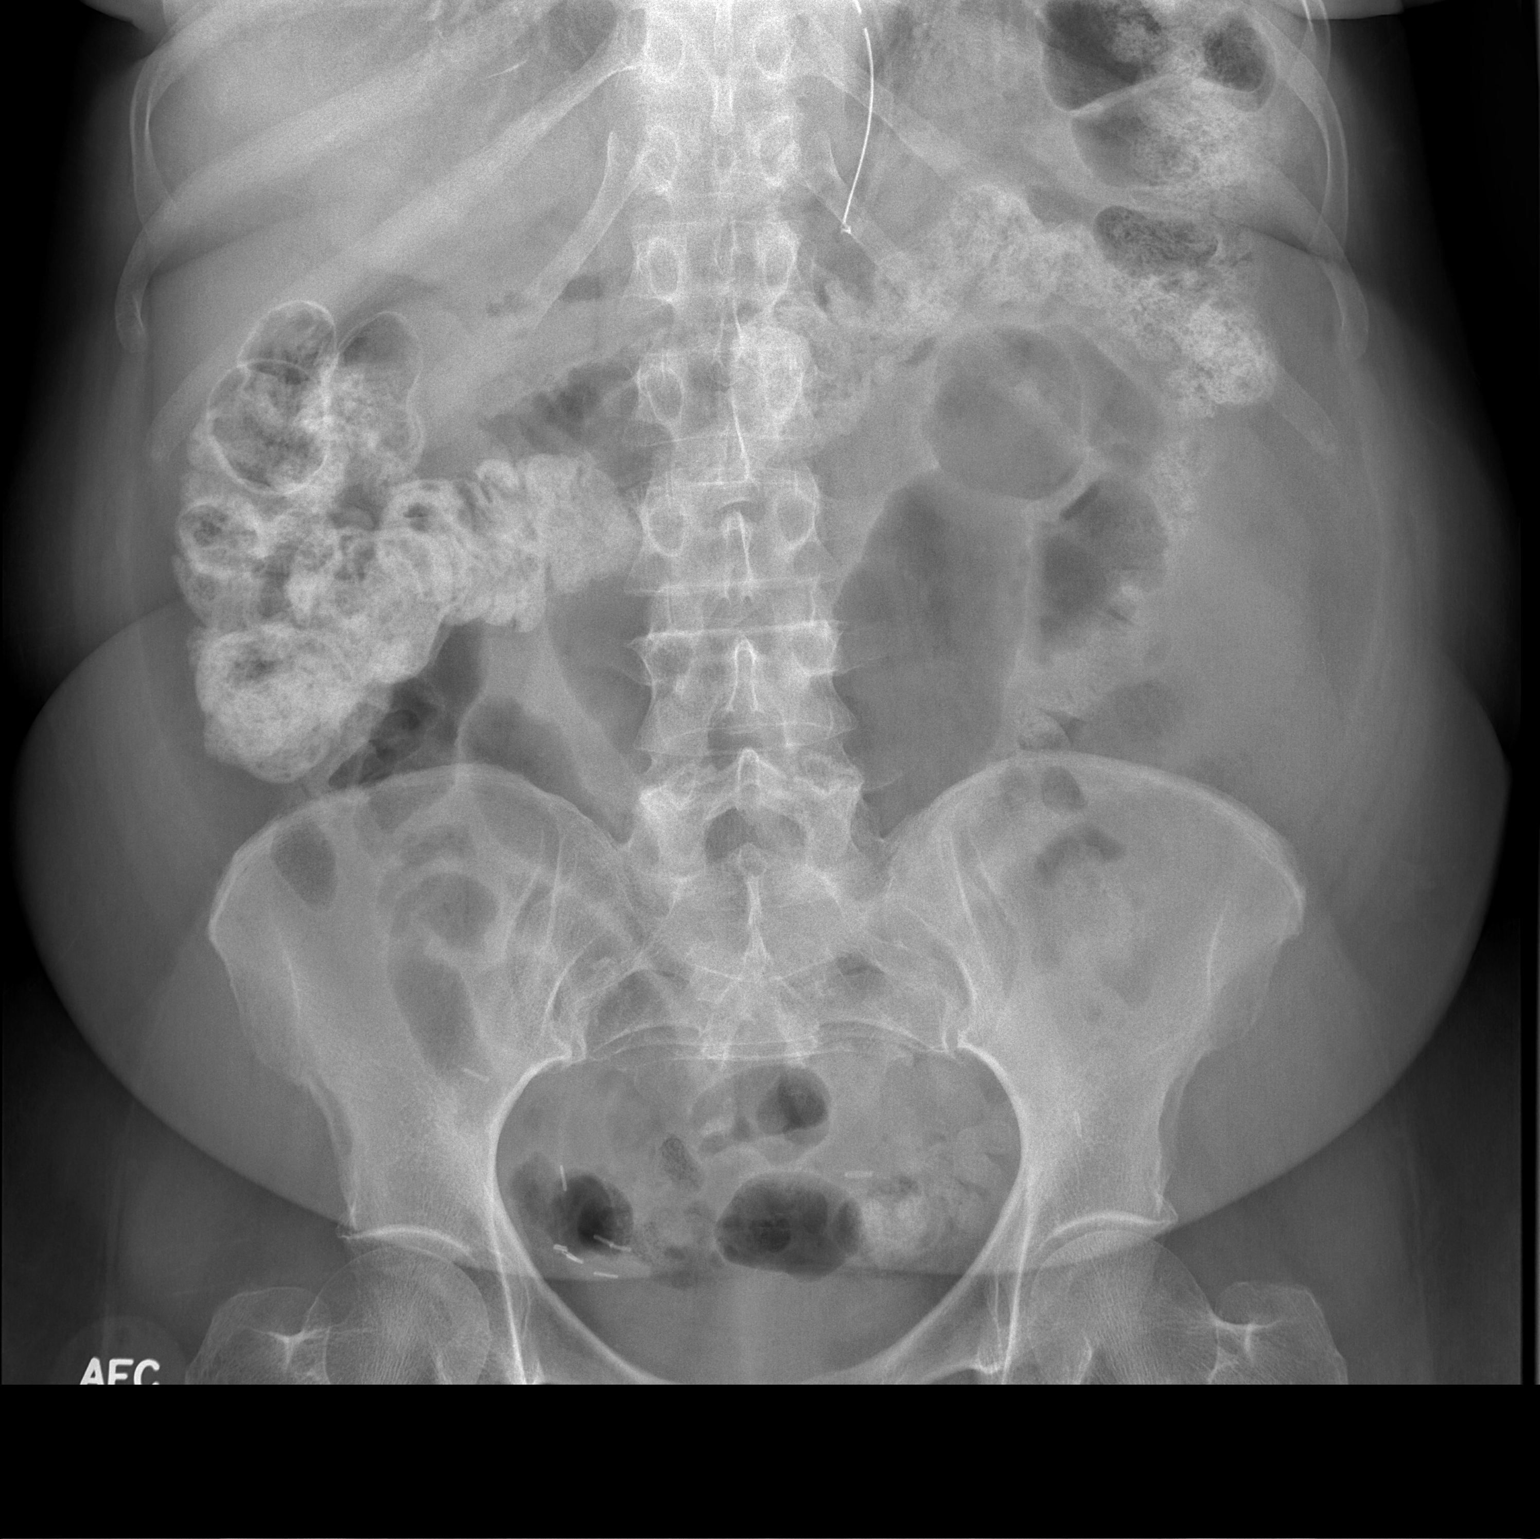

[2 of 2 positions shown; findings below may reference images not displayed]

FINDINGS: Upright film shows no evidence for intraperitoneal free
air.  NG tube tip projects in the region of the gastric cardia.
The proximal port the NG tube is above the GE junction.  Supine
film shows oral contrast material in the transverse colon.  There
are some persistent gas distended small bowel loops in the central
abdomen, but overall appearance is improved compared to the study
from 2 days ago.
IMPRESSION: No intraperitoneal free air.  Slight improvement in gaseous bowel
distention.

NG tube tip is just distal to the EG junction.

## 2011-05-19 ENCOUNTER — Inpatient Hospital Stay (HOSPITAL_COMMUNITY): Payer: Medicare Other

## 2011-05-19 LAB — URINE CULTURE

## 2011-05-19 IMAGING — CR DG ABDOMEN 2V
2 series · 2 of 2 positions shown · non-contrast
Comparison: [DATE] and earlier

CLINICAL DATA: Abdominal pain and distention.

ABDOMEN - 2 VIEW

[w abdomen upright]
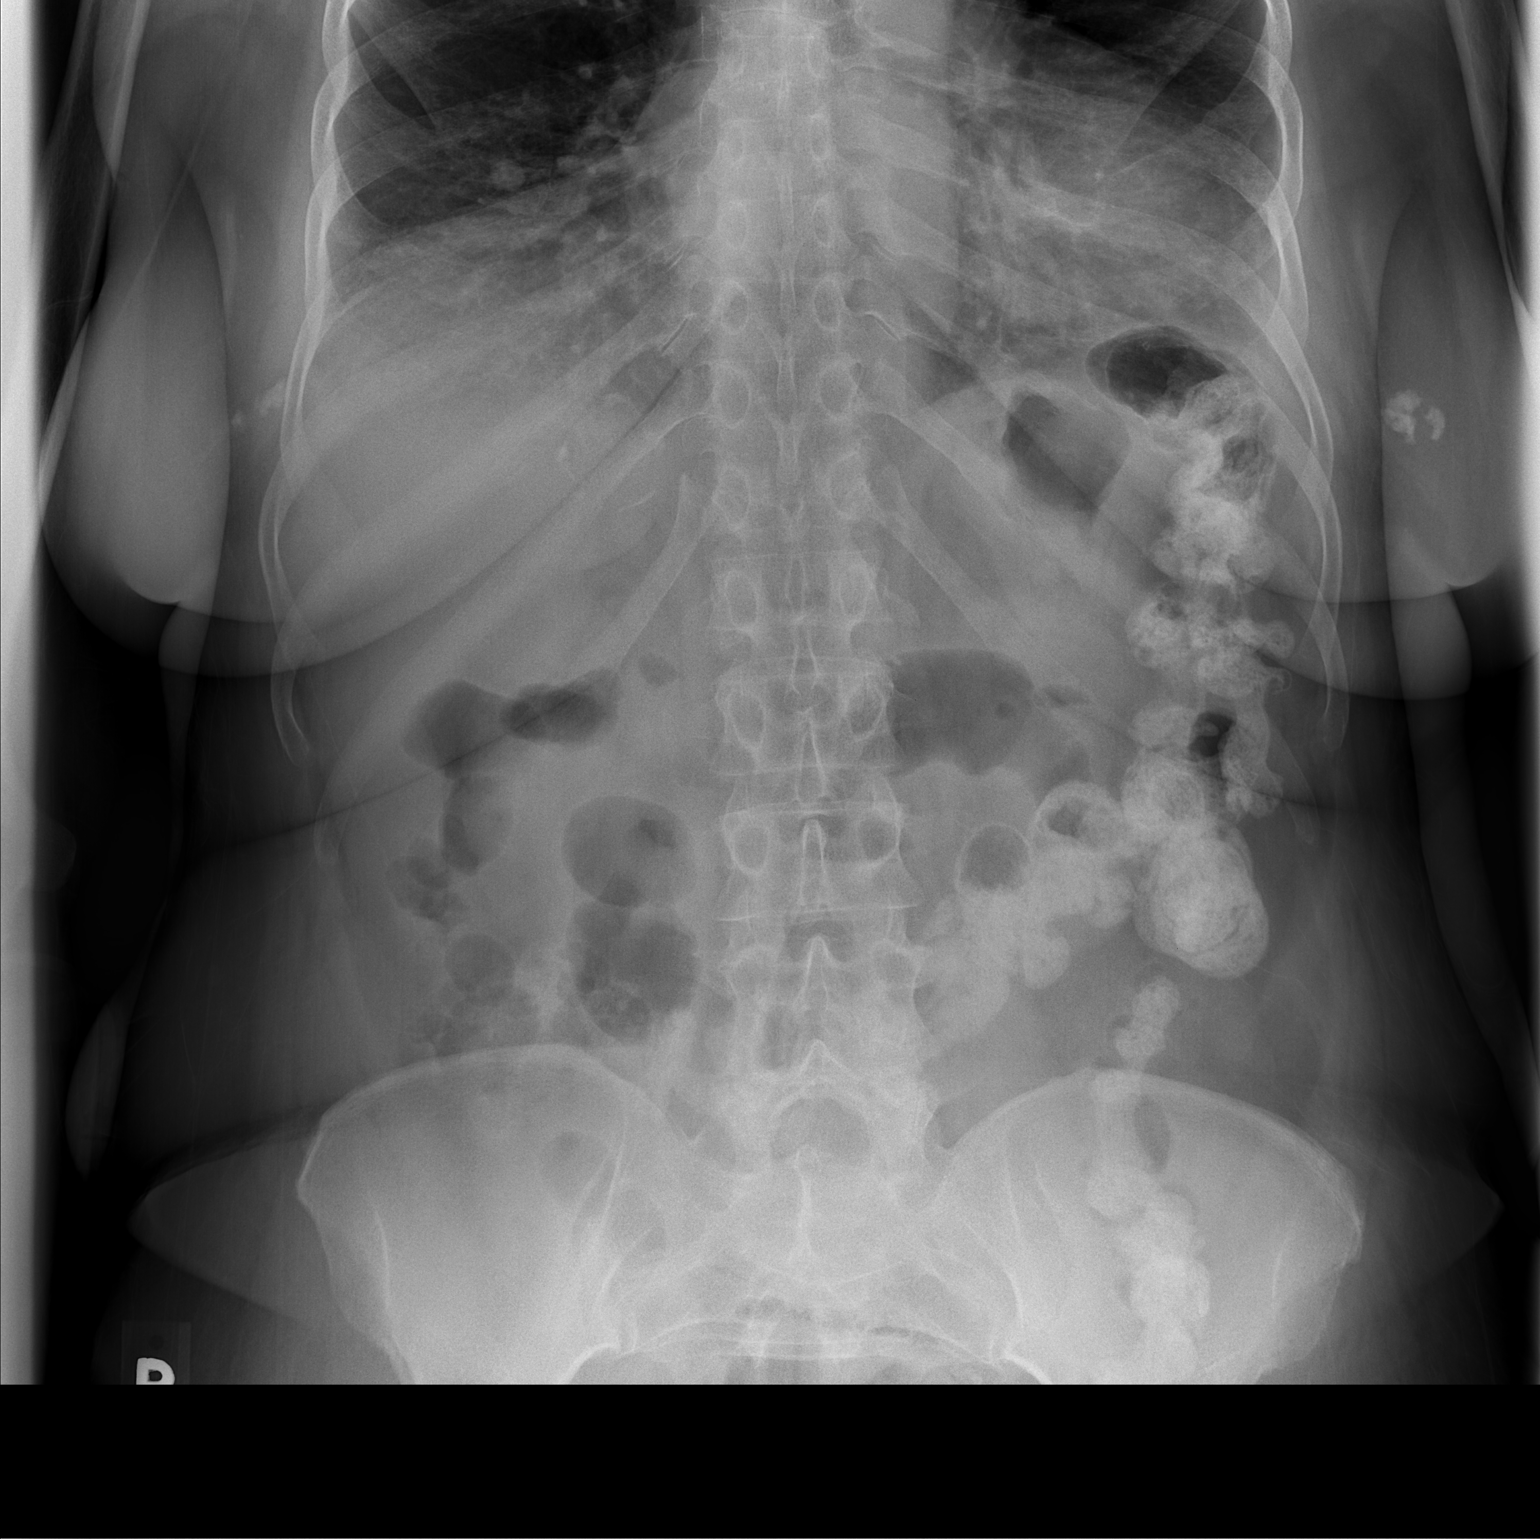

[t abdomen supine]
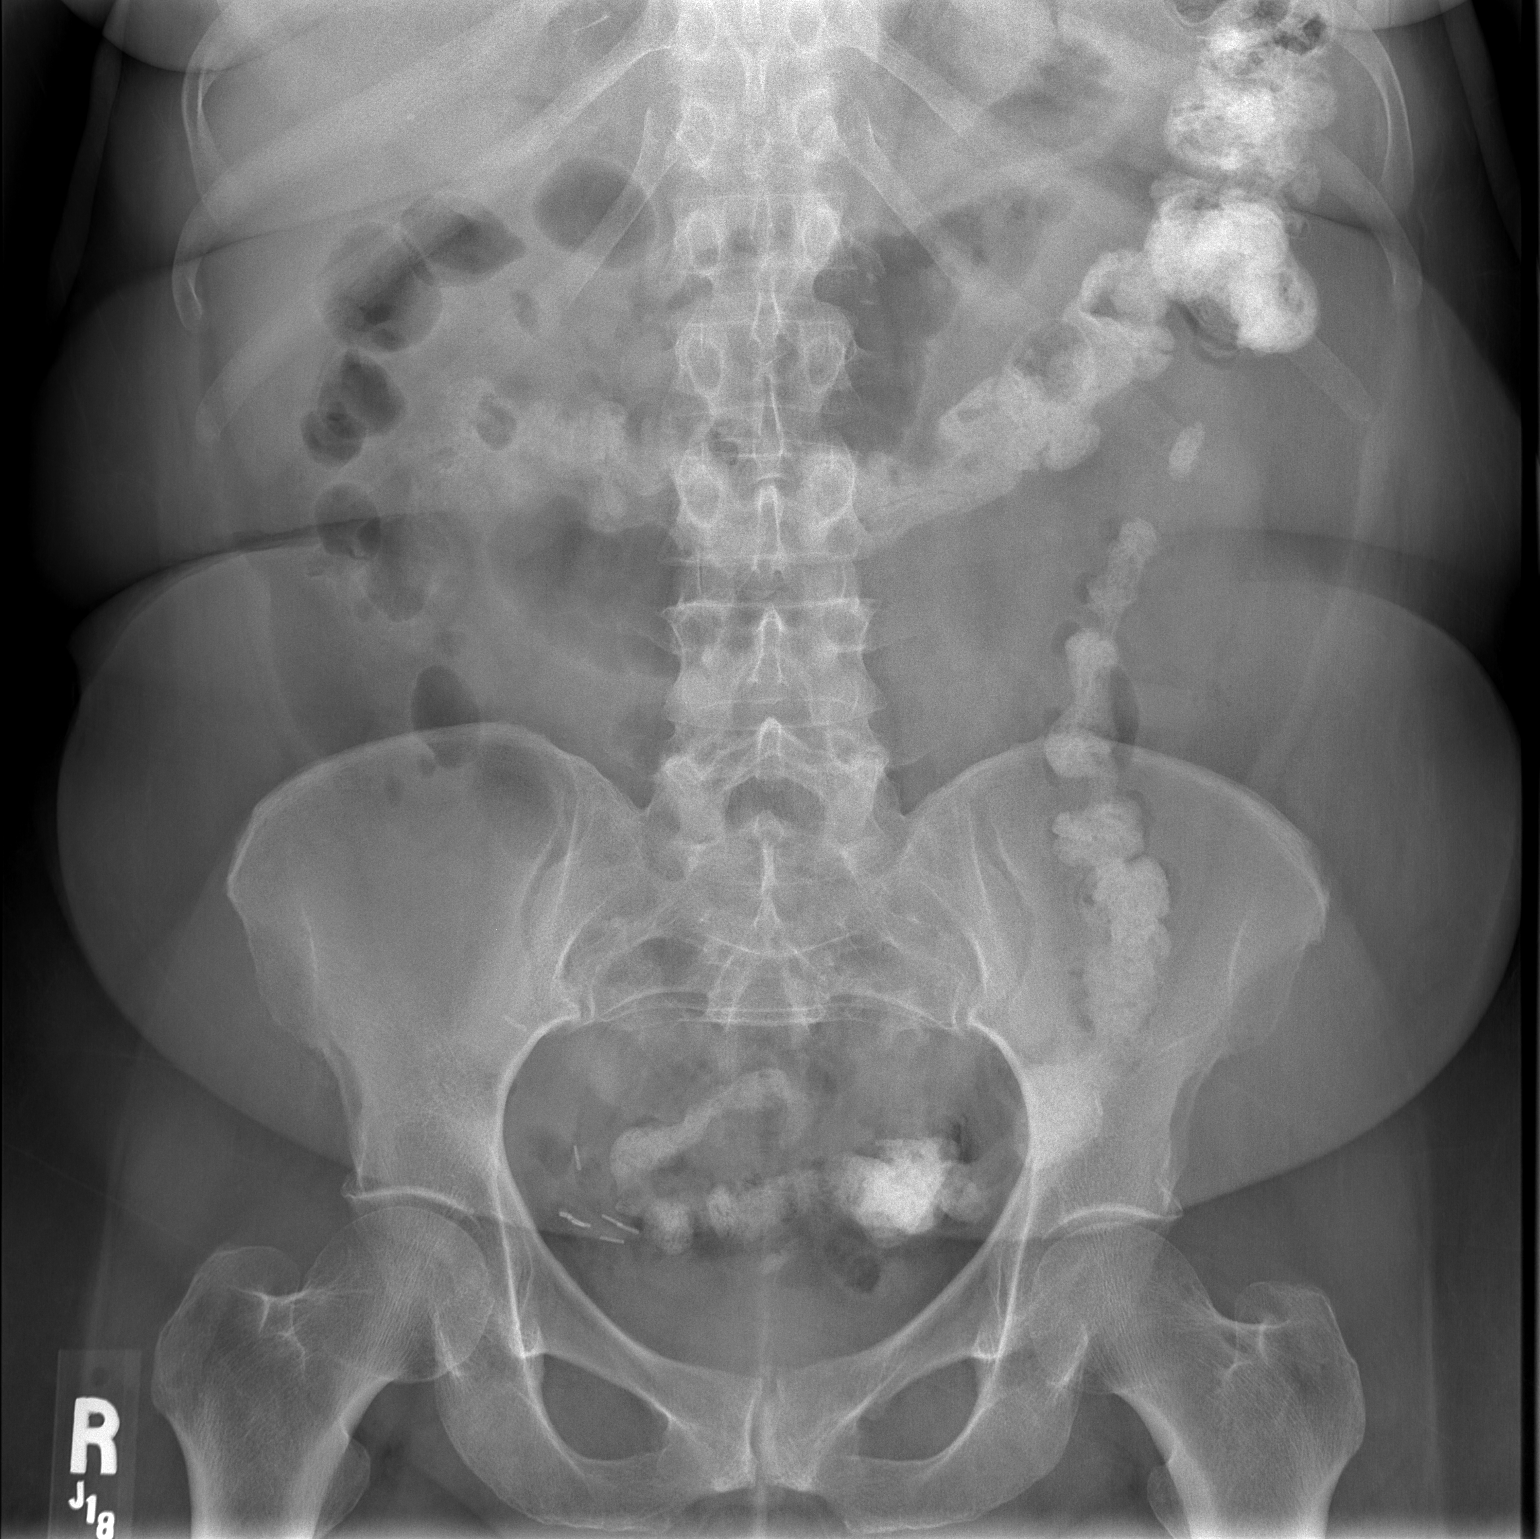

[2 of 2 positions shown; findings below may reference images not displayed]

FINDINGS: Interval decrease in caliber of small bowel loops with no
significant air fluid levels in the upright position.  CT contrast
in the colon with some progression since [DATE].
IMPRESSION: Small bowel obstruction - resolving.

## 2011-05-21 NOTE — H&P (Signed)
Jennifer Calhoun, Jennifer Calhoun NO.:  [id number]  MEDICAL RECORD NO.:  [id number]  LOCATION:  1530                         FACILITY:  [facility]  PHYSICIAN:  Abigail Miyamoto, M.D. DATE OF BIRTH:  [birth year]  DATE OF ADMISSION:  05/15/2011 DATE OF DISCHARGE:                             HISTORY & PHYSICAL   CHIEF COMPLAINT:  Abdominal pain, distention, nausea, and vomiting.  HISTORY:  This is a 70 year old African American female who has had a history of small bowel obstruction in the past who presents with a 1- week history of intermittent crampy abdominal pain.  She has had distention with this.  She has had 3 episodes of emesis.  She reports she is passing flatus, but her last bowel movement was several days ago and she feels constipated.  Otherwise, she was without complaints.  She had an outpatient CAT scan performed yesterday and after findings were reported, she was sent to the emergency department.  Upon presenting to the emergency room, nasogastric tube was placed.  She now feels pain- free since placement of the nasogastric tube and she is still passing some flatus.  Otherwise, she is without complaints.  She had no hematemesis.  She has had no dysuria.  She has had no fever or chills.  PAST MEDICAL HISTORY:  Significant for several admissions in the early 2000s for small bowel obstructions.  She has had a repair of incarcerated femoral hernia with small bowel resection in 2000.  She has had a total abdominal hysterectomy.  Her last colonoscopy last November was negative by Dr. Charna Elizabeth.  She has also had a workup of chest pain in 2010, which was unremarkable.  Her medical history is otherwise unremarkable.  MEDICATIONS:  Please see universal medical reconciliation form.  ALLERGIES:  PENICILLIN.  SOCIAL HISTORY:  She does not smoke, does not drink alcohol.  FAMILY HISTORY:  Noncontributory.  REVIEW OF SYSTEMS:  GENERAL:  Negative fever or chills.   PULMONARY: Negative cough, shortness breath, or difficulty breathing.  CARDIAC: Negative chest pain or irregular heartbeat.  GI:  Listed as above. URINARY:  Negative for dysuria or hematuria.  Rest of the review of systems including skin, eyes, ears, nose, throat, musculoskeletal, neurologic, psychiatric, endocrine are normal.  PHYSICAL EXAMINATION:  GENERAL:  A well-developed, well-nourished female, in no acute distress.  She appears comfortable. VITAL SIGNS:  Temperature 93, pulse 86, respiratory rate 16, blood pressures 135/75. EYES:  Anicteric.  Pupils reactive bilaterally. ENT:  External ears and nose normal.  Hearing is normal.  Oropharynx is clear. NECK:  Supple.  Trachea is midline.  There is no thyromegaly. LUNGS:  Clear to auscultation bilaterally with normal respiratory effort. CARDIOVASCULAR:  Regular rate and rhythm.  There is systolic ejection murmur.  There is no peripheral edema. ABDOMEN:  Distended mostly in the upper abdomen.  There are no hernias. It is tympanitic.  She is nontender.  There is no guarding.  There are no masses. SKIN:  No rashes and no jaundice. EXTREMITIES:  Warm, well perfused.  No edema, clubbing, or cyanosis. Peripheral pulses are intact to all 4 extremities. NEUROMUSCULAR:  Normal.  Gross motor and sensory function in all 4 extremities. PSYCHIATRIC:  Awake, alert, and oriented.  Judgment and affect appear normal.  DATA REVIEWED:  The patient currently has a potassium of 2.9, creatinine is 1.10.  CBC shows a white blood count 5.7, hemoglobin 11.6, hematocrit of 33.8, platelets of 286, lipase is normal.  The patient has a plain abdominal x-rays, which show her to have dilated loops of small bowel with air-fluid levels.  There is no free air.  She had a CAT scan of the abdomen and pelvis showing her to have dilated loops of small bowel with collapse in the midabdomen.  There is stool in the colon, which is otherwise collapsed.  There is no  free air.  IMPRESSION:  This patient with a small bowel obstruction, which appears to be partial, I suspect this is secondary to adhesions.  She also has hypokalemia secondary to the bowel obstruction.  Plan will be to admit her to the hospital for conservative management with bowel rest, IV rehydration nasogastric, suctioning.  We will place her on potassium IV.  We will followup abdominal x-rays in the morning. If this does not improve with conservative management, she may need exploratory laparotomy.     Abigail Miyamoto, M.D.     DB/MEDQ  D:  05/16/2011  T:  05/16/2011  Job:  [id number]  cc:   Anselmo Rod, MD, [facility] Fax: [phone]  Electronically Signed by Abigail Miyamoto M.D. on 05/21/2011 04:31:47 PM

## 2011-05-27 NOTE — Discharge Summary (Signed)
  NAMECHANTEL, TETI NO.:  [id number]  MEDICAL RECORD NO.:  [id number]  LOCATION:  1530                         FACILITY:  [facility]  PHYSICIAN:  Wilmon Arms. Corliss Skains, M.D. DATE OF BIRTH:  04-Jan-1941  DATE OF ADMISSION:  05/15/2011 DATE OF DISCHARGE:  05/20/2011                              DISCHARGE SUMMARY   ADMITTING PHYSICIAN:  Abigail Miyamoto, M.D.  DISCHARGING PHYSICIAN:  Wilmon Arms. Corliss Skains, M.D.  CONSULTANTS:  None.  PROCEDURES:  None.  REASON FOR ADMISSION:  Ms. Fennel is a 70 year old black female who has had a history of small-bowel obstructions in the past with a 1-week history of intermittent crampy abdominal pain.  She admits to some distention and has had 3 episodes of emesis.  She had passed some flatus, but her last bowel movement was several days prior to admission. She did have an outpatient CT scan which revealed a small-bowel obstruction and she was then sent to the emergency department where we have been asked to evaluate her.  Please see admitting history and physical for further details.  ADMITTING DIAGNOSIS:  Small-bowel obstruction which appears to be partial, likely secondary to adhesions.  HOSPITAL COURSE:  This time, the patient was admitted.  She was continued on low intermittent wall suction for her NG tube and bowel rest was continued.  She had recurrent abdominal films in the morning. Initially for the first 2 days, the patient was not passing flatus. However, by May 18, 2011, the patient did begin passing flatus.  Her abdominal x-rays showed improvement in her partial small-bowel obstruction.  Her NG tube was clamped.  The following morning, the patient was continuing to pass flatus and did not have any nausea or vomiting with her tube clamped.  At this time, it was discontinued and her diet was advanced as tolerated.  The following day, the patient was tolerating a regular diet and continued to have bowel movements.   Her abdomen was soft, nontender, nondistended with active bowel sounds. This time, the patient was felt stable for discharge home.  DISCHARGE DIAGNOSIS:  Partial small-bowel obstruction, resolved.  DISCHARGE MEDICATIONS:  Please see medication reconciliation form.  DISCHARGE INSTRUCTIONS:  The patient has no activity restrictions.  She should maintain a low fiber diet for the next week.  Otherwise, she may resume her normal diet.  She should add MiraLax to her daily bowel regimen for constipation issues.  Otherwise, she may return to see [place] at [facility] on a p.r.n. basis.     Letha Cape, PA   ______________________________ Wilmon Arms. Corliss Skains, M.D.    KEO/MEDQ  D:  05/20/2011  T:  05/20/2011  Job:  [id number]  cc:   Anselmo Rod, MD, [facility] Fax: [phone]  Electronically Signed by Barnetta Chapel PA on 05/22/2011 11:57:15 AM Electronically Signed by Manus Rudd M.D. on 05/27/2011 01:03:36 PM

## 2011-12-08 ENCOUNTER — Other Ambulatory Visit: Payer: Self-pay | Admitting: *Deleted

## 2011-12-08 DIAGNOSIS — Z1231 Encounter for screening mammogram for malignant neoplasm of breast: Secondary | ICD-10-CM

## 2011-12-11 ENCOUNTER — Ambulatory Visit
Admission: RE | Admit: 2011-12-11 | Discharge: 2011-12-11 | Disposition: A | Discharge status: Home / Self Care | Payer: Medicare Other | Source: Ambulatory Visit | Attending: Internal Medicine | Admitting: Internal Medicine

## 2011-12-11 DIAGNOSIS — Z1231 Encounter for screening mammogram for malignant neoplasm of breast: Secondary | ICD-10-CM

## 2011-12-18 ENCOUNTER — Other Ambulatory Visit: Payer: Self-pay | Admitting: Internal Medicine

## 2011-12-18 DIAGNOSIS — R928 Other abnormal and inconclusive findings on diagnostic imaging of breast: Secondary | ICD-10-CM

## 2011-12-25 ENCOUNTER — Ambulatory Visit
Admission: RE | Admit: 2011-12-25 | Discharge: 2011-12-25 | Disposition: A | Discharge status: Home / Self Care | Payer: Medicare Other | Source: Ambulatory Visit | Attending: *Deleted | Admitting: *Deleted

## 2011-12-25 DIAGNOSIS — R928 Other abnormal and inconclusive findings on diagnostic imaging of breast: Secondary | ICD-10-CM

## 2012-07-27 ENCOUNTER — Other Ambulatory Visit: Payer: Self-pay | Admitting: Gastroenterology

## 2012-07-27 ENCOUNTER — Ambulatory Visit
Admission: RE | Admit: 2012-07-27 | Discharge: 2012-07-27 | Disposition: A | Discharge status: Home / Self Care | Payer: Medicare Other | Source: Ambulatory Visit | Attending: Gastroenterology | Admitting: Gastroenterology

## 2012-07-27 DIAGNOSIS — R109 Unspecified abdominal pain: Secondary | ICD-10-CM

## 2013-06-20 ENCOUNTER — Other Ambulatory Visit: Payer: Self-pay

## 2013-06-20 DIAGNOSIS — Z1231 Encounter for screening mammogram for malignant neoplasm of breast: Secondary | ICD-10-CM

## 2013-07-06 ENCOUNTER — Ambulatory Visit: Payer: Medicare Other

## 2013-07-14 ENCOUNTER — Ambulatory Visit: Payer: Self-pay

## 2013-08-18 ENCOUNTER — Ambulatory Visit: Payer: Medicare Other

## 2013-09-13 ENCOUNTER — Ambulatory Visit
Admission: RE | Admit: 2013-09-13 | Discharge: 2013-09-13 | Disposition: A | Discharge status: Home / Self Care | Payer: Medicare Other | Source: Ambulatory Visit

## 2013-09-13 DIAGNOSIS — Z1231 Encounter for screening mammogram for malignant neoplasm of breast: Secondary | ICD-10-CM

## 2013-09-19 ENCOUNTER — Other Ambulatory Visit: Payer: Self-pay | Admitting: *Deleted

## 2013-09-19 DIAGNOSIS — R928 Other abnormal and inconclusive findings on diagnostic imaging of breast: Secondary | ICD-10-CM

## 2013-09-22 ENCOUNTER — Ambulatory Visit
Admission: RE | Admit: 2013-09-22 | Discharge: 2013-09-22 | Disposition: A | Discharge status: Home / Self Care | Payer: Medicare Other | Source: Ambulatory Visit | Attending: *Deleted | Admitting: *Deleted

## 2013-09-22 DIAGNOSIS — R928 Other abnormal and inconclusive findings on diagnostic imaging of breast: Secondary | ICD-10-CM

## 2014-01-13 IMAGING — CR DG ABDOMEN 2V
2 series · 2 of 2 positions shown · non-contrast
Comparison: Abdomen films of [DATE]

CLINICAL DATA: Abdominal pain, constipation, history of small bowel
obstruction

ABDOMEN - 2 VIEW

[w abdomen upright *]
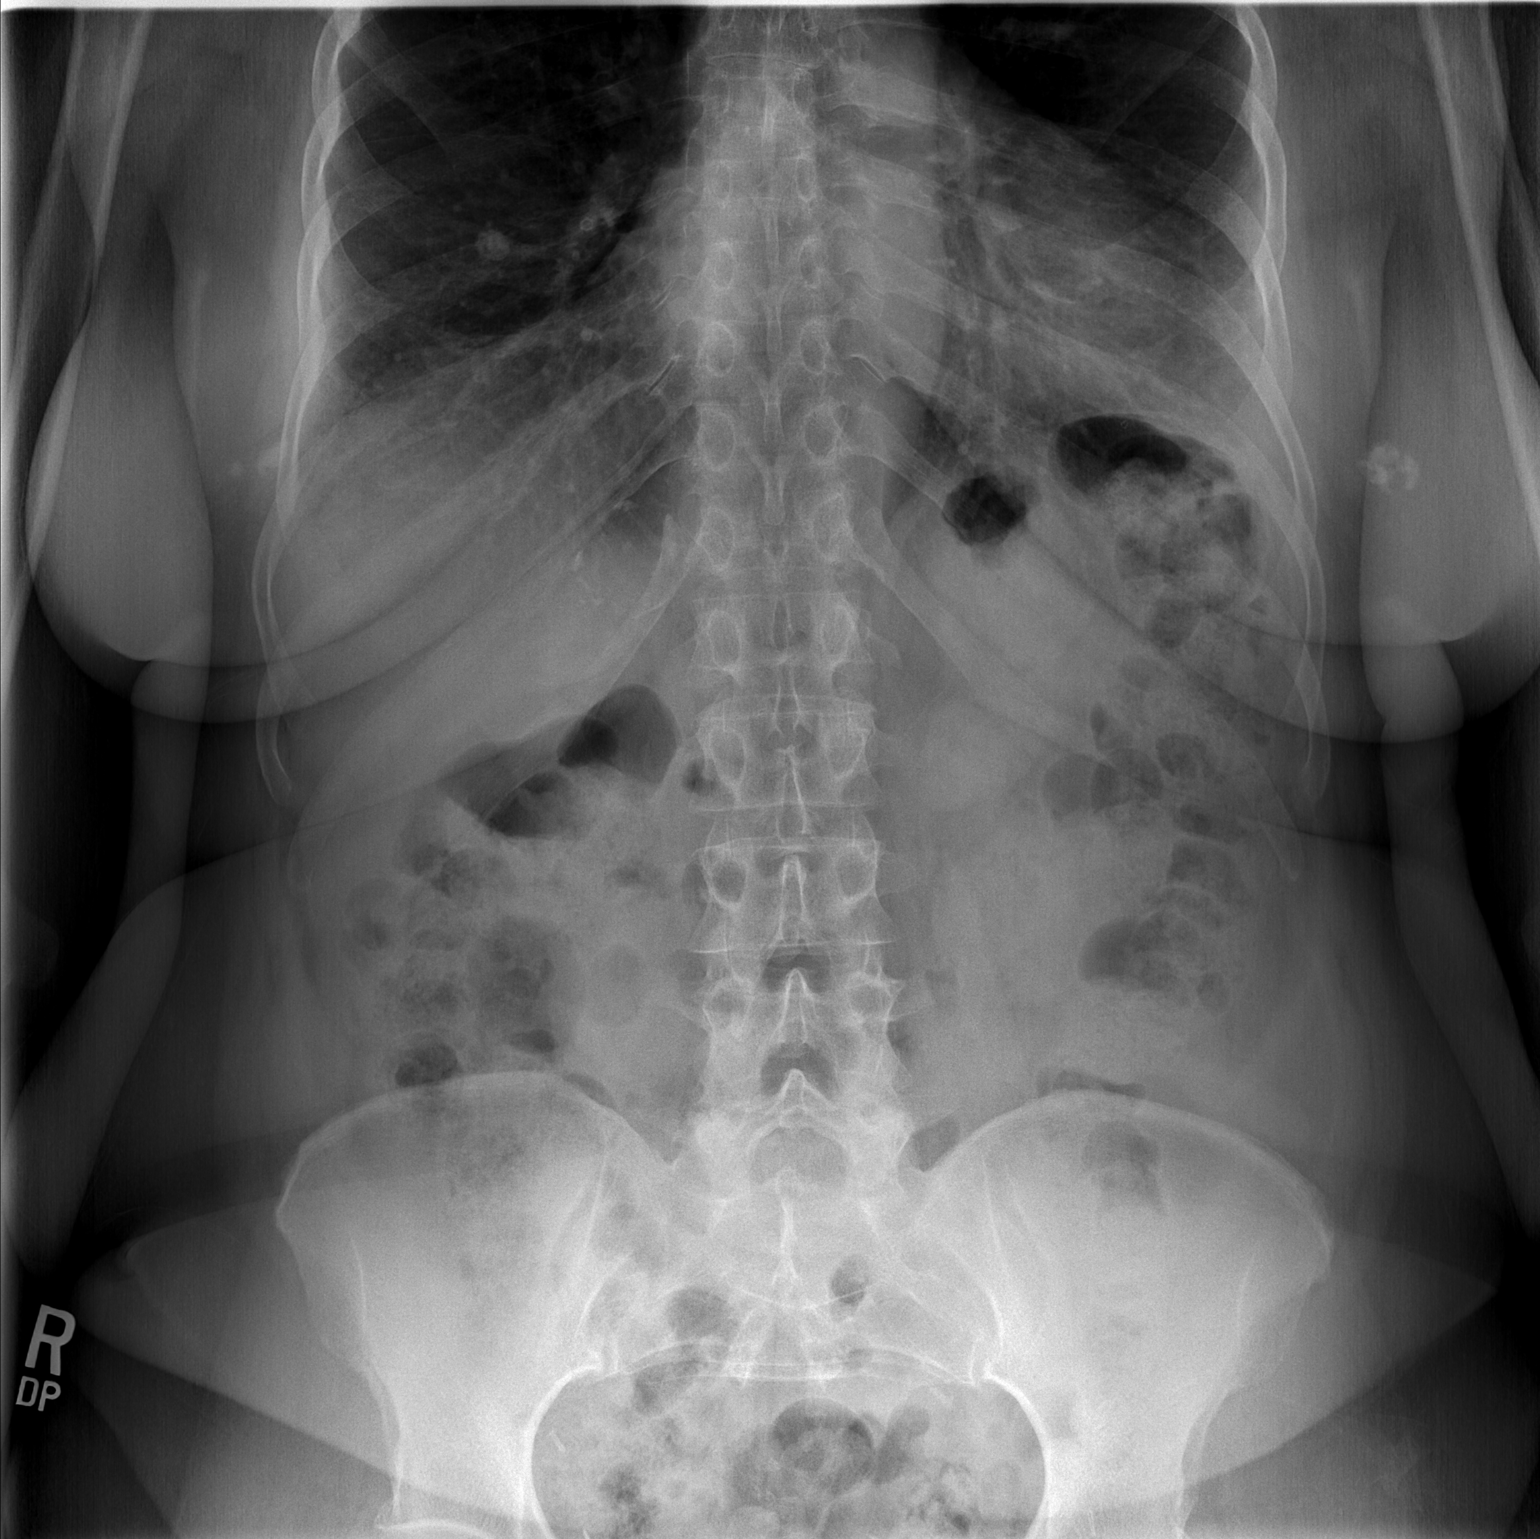

[t abdomen supine]
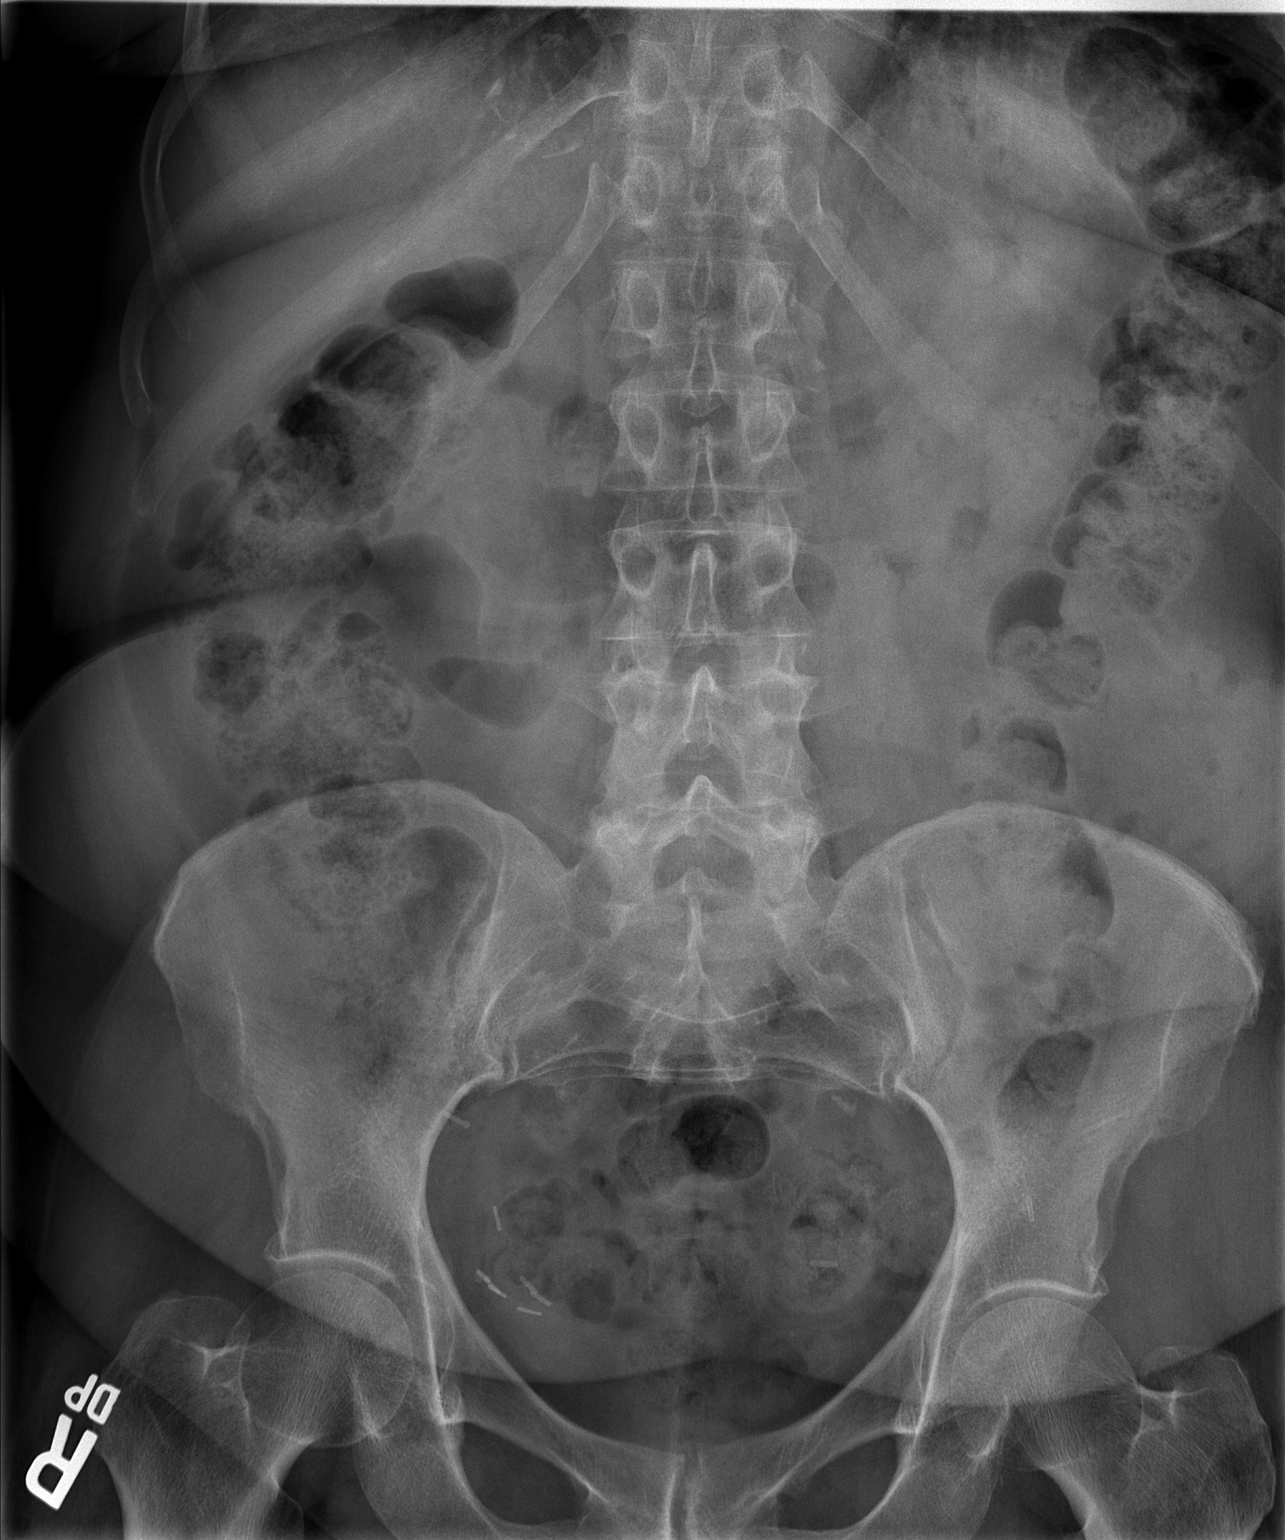

[2 of 2 positions shown; findings below may reference images not displayed]

FINDINGS: Supine and erect views the abdomen show both large and
small bowel gas to be present.  No distention is seen to indicate
current obstruction.  No free air is noted.  Surgical clips present
in the right pelvis.
IMPRESSION: No present bowel obstruction.  No free air.]

## 2014-03-14 ENCOUNTER — Other Ambulatory Visit: Payer: Self-pay | Admitting: *Deleted

## 2014-03-14 DIAGNOSIS — N63 Unspecified lump in unspecified breast: Secondary | ICD-10-CM

## 2014-03-23 ENCOUNTER — Other Ambulatory Visit: Payer: Self-pay | Admitting: *Deleted

## 2014-03-23 DIAGNOSIS — Q782 Osteopetrosis: Secondary | ICD-10-CM

## 2014-03-28 ENCOUNTER — Ambulatory Visit
Admission: RE | Admit: 2014-03-28 | Discharge: 2014-03-28 | Disposition: A | Discharge status: Home / Self Care | Payer: Medicare Other | Source: Ambulatory Visit | Attending: *Deleted | Admitting: *Deleted

## 2014-03-28 ENCOUNTER — Other Ambulatory Visit: Payer: Self-pay | Admitting: *Deleted

## 2014-03-28 DIAGNOSIS — N63 Unspecified lump in unspecified breast: Secondary | ICD-10-CM

## 2014-03-29 ENCOUNTER — Ambulatory Visit
Admission: RE | Admit: 2014-03-29 | Discharge: 2014-03-29 | Disposition: A | Discharge status: Home / Self Care | Payer: Medicare Other | Source: Ambulatory Visit | Attending: *Deleted | Admitting: *Deleted

## 2014-03-29 DIAGNOSIS — Q782 Osteopetrosis: Secondary | ICD-10-CM

## 2014-12-21 ENCOUNTER — Other Ambulatory Visit: Payer: Self-pay | Admitting: Gastroenterology

## 2014-12-21 DIAGNOSIS — R112 Nausea with vomiting, unspecified: Secondary | ICD-10-CM

## 2014-12-21 DIAGNOSIS — R1012 Left upper quadrant pain: Secondary | ICD-10-CM

## 2015-01-17 ENCOUNTER — Encounter (HOSPITAL_COMMUNITY): Payer: Medicare Other

## 2015-01-17 ENCOUNTER — Ambulatory Visit (HOSPITAL_COMMUNITY)
Admission: RE | Admit: 2015-01-17 | Discharge: 2015-01-17 | Disposition: A | Discharge status: Home / Self Care | Payer: Medicare Other | Source: Ambulatory Visit | Attending: Gastroenterology | Admitting: Gastroenterology

## 2015-01-17 DIAGNOSIS — R109 Unspecified abdominal pain: Secondary | ICD-10-CM | POA: Diagnosis present

## 2015-01-17 DIAGNOSIS — R112 Nausea with vomiting, unspecified: Secondary | ICD-10-CM | POA: Insufficient documentation

## 2015-01-17 DIAGNOSIS — R1012 Left upper quadrant pain: Secondary | ICD-10-CM

## 2015-01-17 IMAGING — US US ABDOMEN COMPLETE
1 series · 14 of 25 positions shown · non-contrast
Comparison: CT scan from [DATE].

CLINICAL DATA: Initial encounter for abdominal pain with nausea and
vomiting.

EXAM:
ULTRASOUND ABDOMEN COMPLETE

[Series 1: us abdomen complete · 0.15mm/px · 14 of 117 slices shown]
[im 1/117]
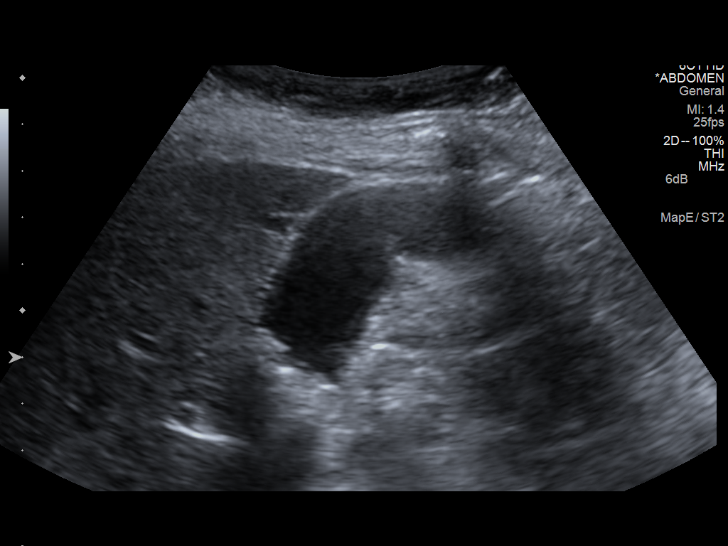
[im 10/117]
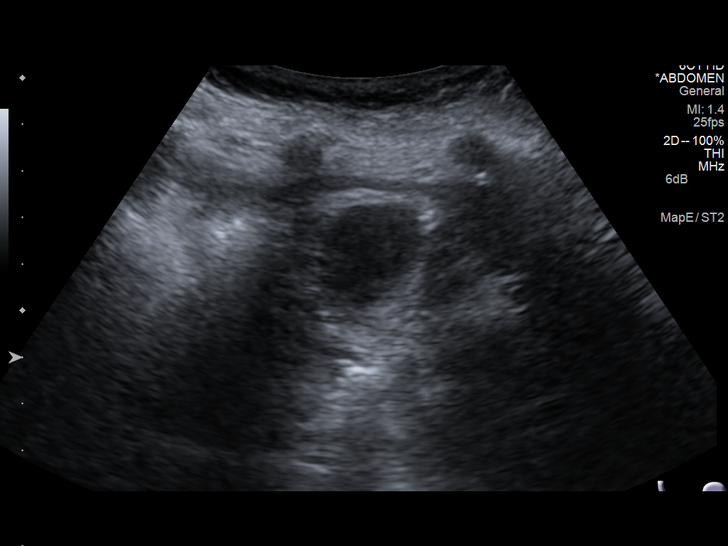
[im 20/117]
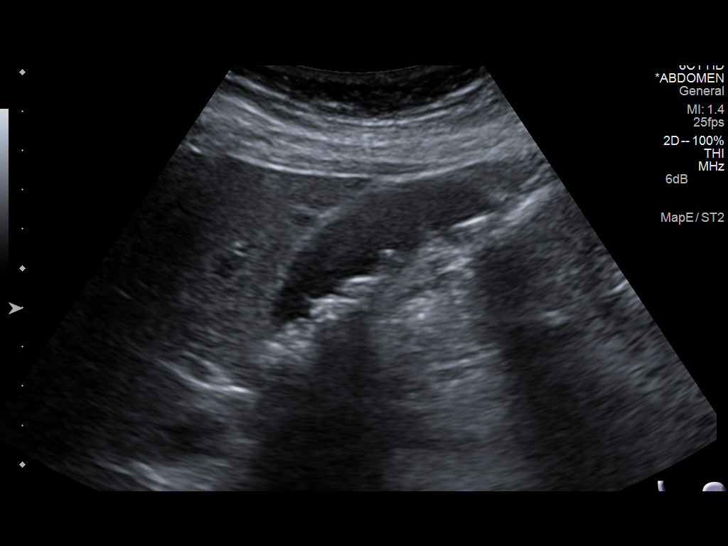
[im 30/117]
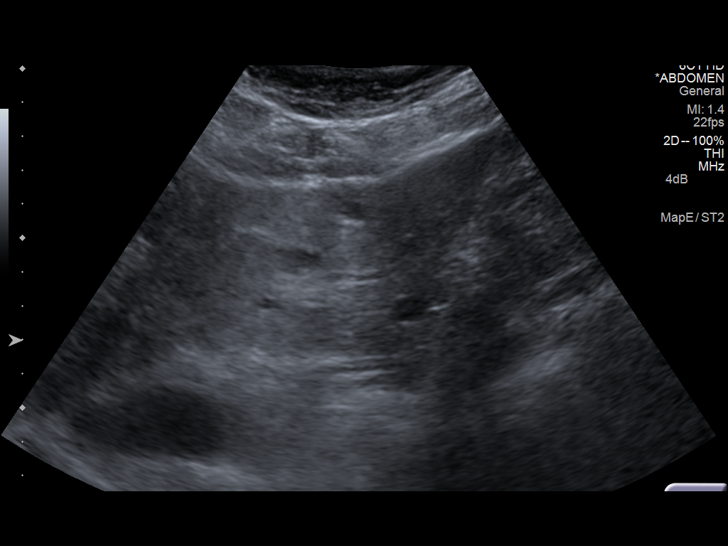
[im 39/117]
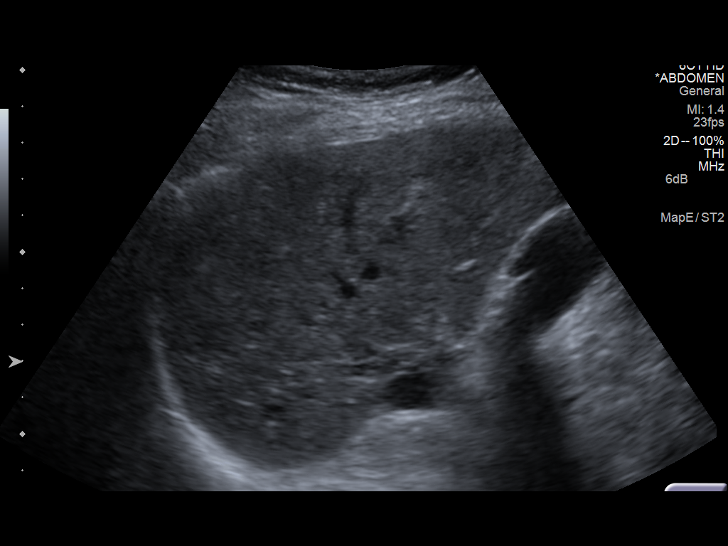
[im 44/117]
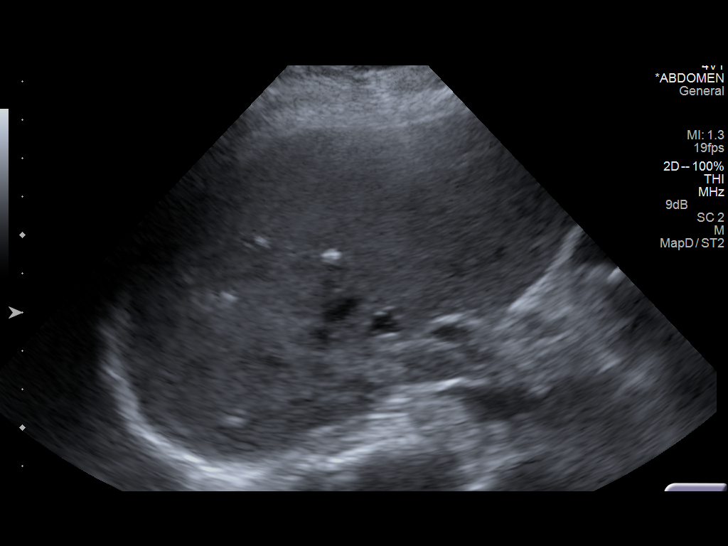
[im 54/117]
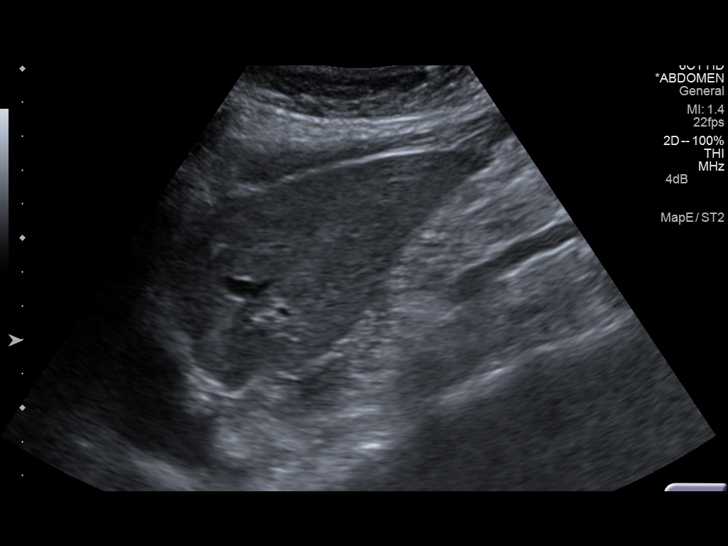
[im 63/117]
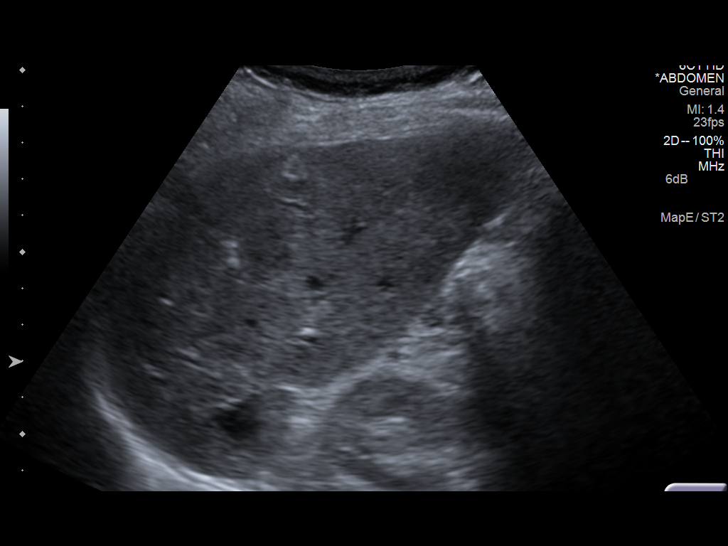
[im 73/117]
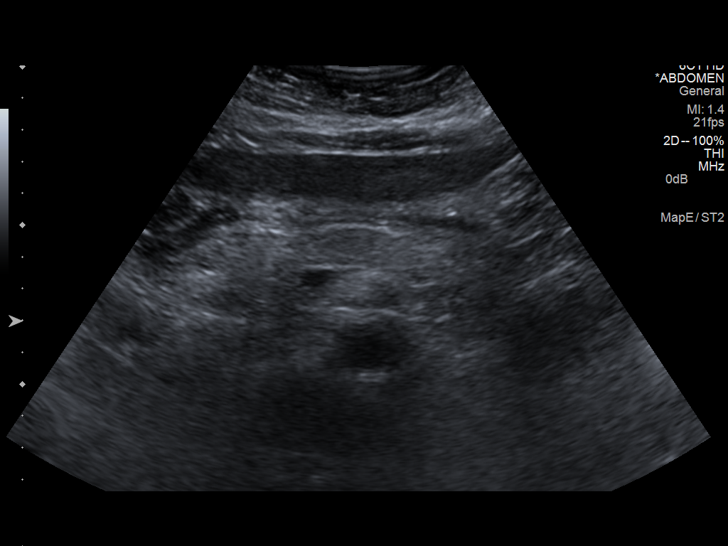
[im 78/117]
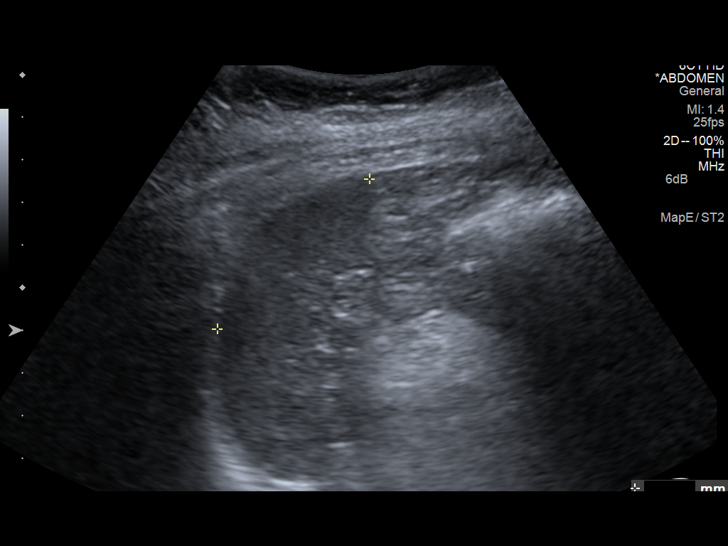
[im 88/117]
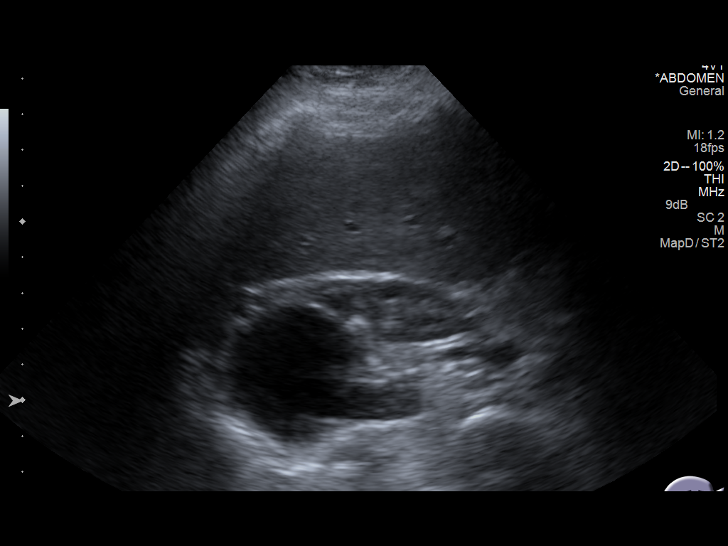
[im 97/117]
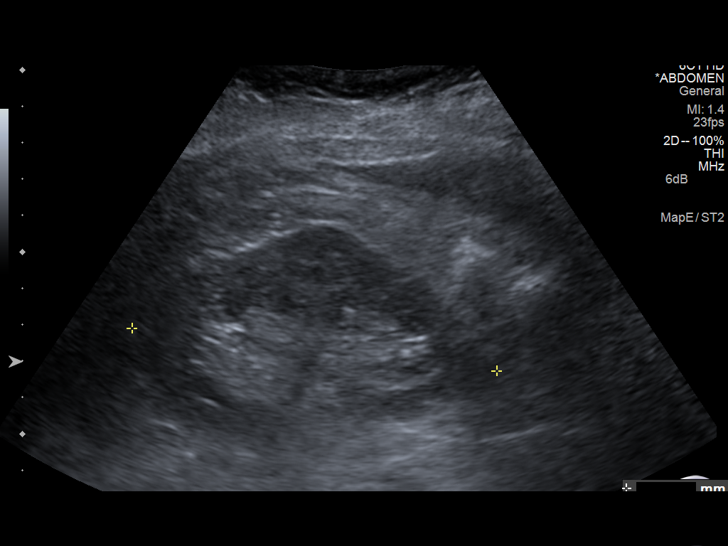
[im 107/117]
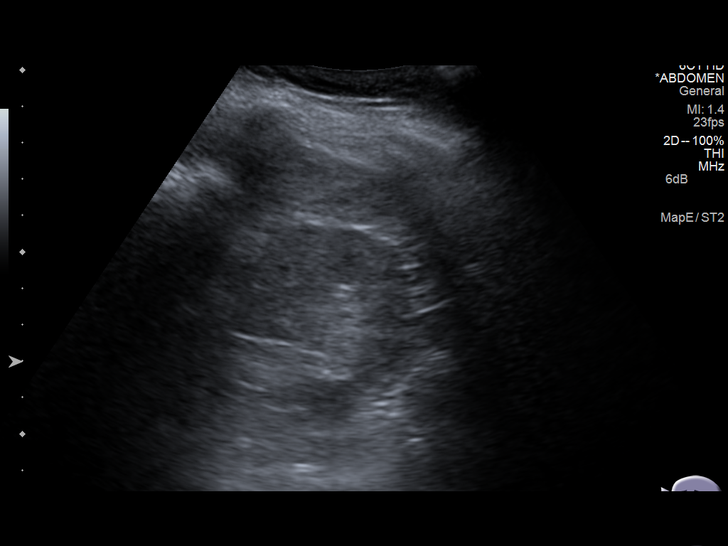
[im 117/117]
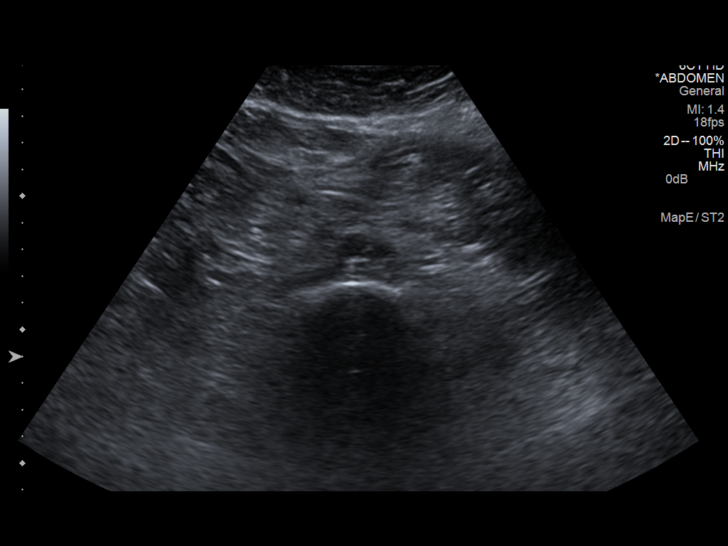

[14 of 25 positions shown; findings below may reference images not displayed]

FINDINGS: Gallbladder: Multiple stones are evident. No gallbladder wall
thickening or pericholecystic fluid. The sonographer reports no
sonographic Murphy sign.

Common bile duct: Diameter: Nondilated at 4 mm diameter.

Liver: Multiple hepatic cysts are evident, including some of which
having peripheral rim calcification. Multiple hepatic cysts, some
with calcification were seen on the previous CT scan.

IVC: No abnormality visualized.

Pancreas: Visualized portion unremarkable.

Spleen: Spleen has normal sonographic appearance.

Right Kidney: Length: 9.1 cm. Simple appearing cyst in the upper
pole the right kidney measures 3.7 cm in coronal diameter. This same
cyst was 3.6 cm in the same dimension on the prior CT scan.

Left Kidney: Length: 10.1 cm. 14 mm cyst identified in the
interpolar left kidney. No hydronephrosis.

Abdominal aorta: No aneurysm visualized.

Other findings: None.
IMPRESSION: Cholelithiasis without sonographic signs acute cholecystitis. No
biliary dilatation.

Hepatic and renal cysts.

## 2015-01-29 ENCOUNTER — Other Ambulatory Visit (INDEPENDENT_AMBULATORY_CARE_PROVIDER_SITE_OTHER): Payer: Self-pay | Admitting: Surgery

## 2015-03-21 ENCOUNTER — Encounter (HOSPITAL_BASED_OUTPATIENT_CLINIC_OR_DEPARTMENT_OTHER): Payer: Self-pay | Admitting: *Deleted

## 2015-03-21 NOTE — H&P (Addendum)
Jennifer Calhoun  Location: [facility] Patient #: [id number] DOB: May 13, 1941 Divorced / Language: [place] / Race: Black or African American Female  History of Present Illness (Jasmine Maceachern A. Magnus Ivan MD; Patient words: gallstones.  The patient is a 74 year old female who presents with symptomatic choledocholithiasis. This is a very pleasant female who is known to our group in the past for multiple admissions for partial small bowel obstructions. She has not had an obstruction for several years. Recently, she had an attack of epigastric abdominal pain related to the back with nausea and vomiting. This was very different from her previous bowel obstructions. It occurred after fatty meals. The attack lasted several hours and then subsided. She says had an ultrasound showing cholelithiasis with a normal bile duct and no evidence of cholecystitis. Since that attack, she has been doing well and has no further pain. She is currently being worked up by cardiology for recent diagnosis of a murmur.   Other Problems  Cholelithiasis Diverticulosis Hypercholesterolemia Oophorectomy  Past Surgical History Cataract Surgery Bilateral. Resection of Small Bowel  Diagnostic Studies History  Colonoscopy 1-5 years ago Mammogram 1-3 years ago Pap Smear >5 years ago  Allergies Gilmer Mor, CMA; 01/29/2015 2:24 PM) Penicillamine *ASSORTED CLASSES*  Medication History (Sonya Bynum Atorvastatin Calcium (  Tablet, Oral) Active. Linzess ( Capsule, Oral) Active. Medications Reconciled  Social History Lamar Laundry Bynum, CMA;  Alcohol use Moderate alcohol use. Caffeine use Tea. No drug use Tobacco use Former smoker.  Pregnancy / Birth History Gilmer Mor, CMA;  Age at menarche 13 years. Gravida 1 Maternal age 62-20  Review of Systems 73 Bynum CM General Not Present- Appetite Loss, Chills, Fatigue, Fever, Night Sweats, Weight Gain and Weight Loss. Skin  Not Present- Change in Wart/Mole, Dryness, Hives, Jaundice, New Lesions, Non-Healing Wounds, Rash and Ulcer. HEENT Not Present- Earache, Hearing Loss, Hoarseness, Nose Bleed, Oral Ulcers, Ringing in the Ears, Seasonal Allergies, Sinus Pain, Sore Throat, Visual Disturbances, Wears glasses/contact lenses and Yellow Eyes. Gastrointestinal Not Present- Abdominal Pain, Bloating, Bloody Stool, Change in Bowel Habits, Chronic diarrhea, Constipation, Difficulty Swallowing, Excessive gas, Gets full quickly at meals, Hemorrhoids, Indigestion, Nausea, Rectal Pain and Vomiting.   Vitals (Sonya Bynum CMA;  Weight: 175 lb Height: 63in Body Surface Area: 1.88 m Body Mass Index: 31 kg/m Temp.: 97.66F(Temporal)  Pulse: 77 (Regular)  BP: 126/78 (Sitting, Left Arm, Standard)    Physical Exam (Yuval Nolet A. Magnus Ivan MD;  General Mental Status-Alert. General Appearance-Consistent with stated age. Hydration-Well hydrated. Voice-Normal.  Head and Neck Head-normocephalic, atraumatic with no lesions or palpable masses.  Eye Eyeball - Bilateral-Extraocular movements intact. Sclera/Conjunctiva - Bilateral-No scleral icterus.  Chest and Lung Exam Chest and lung exam reveals -quiet, even and easy respiratory effort with no use of accessory muscles and on auscultation, normal breath sounds, no adventitious sounds and normal vocal resonance. Inspection Chest Wall - Normal. Back - normal.  Cardiovascular Cardiovascular examination reveals -on palpation PMI is normal in location and amplitude, no palpable S3 or S4. Normal cardiac borders., normal heart sounds, regular rate and rhythm with no murmurs, carotid auscultation reveals no bruits and normal pedal pulses bilaterally.  Abdomen Inspection Inspection of the abdomen reveals - No Hernias. Skin - Scar - Note: She has a lower midline laparotomy scar with no evidence of hernia. Palpation/Percussion Palpation and Percussion of the  abdomen reveal - Soft, Non Tender, No Rebound tenderness, No Rigidity (guarding) and No hepatosplenomegaly. Auscultation Auscultation of the abdomen reveals - Bowel sounds normal.  Neurologic  Neurologic evaluation reveals -alert and oriented x 3 with no impairment of recent or remote memory. Mental Status-Normal.  Musculoskeletal Normal Exam - Left-Upper Extremity Strength Normal and Lower Extremity Strength Normal. Normal Exam - Right-Upper Extremity Strength Normal, Lower Extremity Weakness.    Assessment & Plan  SYMPTOMATIC CHOLELITHIASIS (574.20  K80.20)  Impression: I do suspect this was symptomatic cholelithiasis and not related to her previous partial small bowel obstructions. I recommend laparoscopic cholecystectomy. I could at the same time at least evaluate her adhesions in the pelvis. I gave her literature regarding the surgery. I discussed the risks of surgery which includes but is not limited to bleeding, infection, bile duct injury, bile leak, the need to convert to an open procedure, need for cholangiogram, injury to surrounding structures, postoperative recovery, cardiopulmonary issues, etc. Surgery will be scheduled.

## 2015-03-21 NOTE — Progress Notes (Signed)
No labs per anesthesia-hx SBO-

## 2015-03-22 ENCOUNTER — Ambulatory Visit (HOSPITAL_BASED_OUTPATIENT_CLINIC_OR_DEPARTMENT_OTHER): Payer: Medicare Other | Admitting: Certified Registered"

## 2015-03-22 ENCOUNTER — Encounter (HOSPITAL_BASED_OUTPATIENT_CLINIC_OR_DEPARTMENT_OTHER): Payer: Self-pay | Admitting: Certified Registered"

## 2015-03-22 ENCOUNTER — Ambulatory Visit (HOSPITAL_BASED_OUTPATIENT_CLINIC_OR_DEPARTMENT_OTHER)
Admission: RE | Admit: 2015-03-22 | Discharge: 2015-03-22 | Disposition: A | Discharge status: Home / Self Care | Payer: Medicare Other | Source: Ambulatory Visit | Attending: Surgery | Admitting: Surgery

## 2015-03-22 ENCOUNTER — Encounter (HOSPITAL_BASED_OUTPATIENT_CLINIC_OR_DEPARTMENT_OTHER)
Admission: RE | Disposition: A | Discharge status: Home / Self Care | Payer: Self-pay | Source: Ambulatory Visit | Attending: Surgery

## 2015-03-22 DIAGNOSIS — I951 Orthostatic hypotension: Secondary | ICD-10-CM | POA: Diagnosis not present

## 2015-03-22 DIAGNOSIS — I252 Old myocardial infarction: Secondary | ICD-10-CM | POA: Insufficient documentation

## 2015-03-22 DIAGNOSIS — E780 Pure hypercholesterolemia: Secondary | ICD-10-CM | POA: Insufficient documentation

## 2015-03-22 DIAGNOSIS — K8020 Calculus of gallbladder without cholecystitis without obstruction: Secondary | ICD-10-CM | POA: Diagnosis not present

## 2015-03-22 DIAGNOSIS — Z87891 Personal history of nicotine dependence: Secondary | ICD-10-CM | POA: Diagnosis not present

## 2015-03-22 DIAGNOSIS — I509 Heart failure, unspecified: Secondary | ICD-10-CM | POA: Insufficient documentation

## 2015-03-22 DIAGNOSIS — K5790 Diverticulosis of intestine, part unspecified, without perforation or abscess without bleeding: Secondary | ICD-10-CM | POA: Insufficient documentation

## 2015-03-22 DIAGNOSIS — Z01818 Encounter for other preprocedural examination: Secondary | ICD-10-CM | POA: Diagnosis not present

## 2015-03-22 HISTORY — DX: Hyperlipidemia, unspecified: E78.5

## 2015-03-22 HISTORY — DX: Presence of spectacles and contact lenses: Z97.3

## 2015-03-22 HISTORY — DX: Personal history of other diseases of the digestive system: Z87.19

## 2015-03-22 HISTORY — PX: CHOLECYSTECTOMY: SHX55

## 2015-03-22 HISTORY — DX: Other seasonal allergic rhinitis: J30.2

## 2015-03-22 HISTORY — DX: Personal history of transient ischemic attack (TIA), and cerebral infarction without residual deficits: Z86.73

## 2015-03-22 SURGERY — LAPAROSCOPIC CHOLECYSTECTOMY
Anesthesia: General | Site: Abdomen

## 2015-03-22 MED ORDER — HYDROCODONE-ACETAMINOPHEN 5-325 MG PO TABS: 1.0000 | ORAL_TABLET | ORAL | Status: DC | PRN

## 2015-03-22 MED ORDER — OXYCODONE HCL 5 MG/5ML PO SOLN: 5.0000 mg | Freq: Once | ORAL | Status: DC | PRN

## 2015-03-22 MED ORDER — DEXAMETHASONE SODIUM PHOSPHATE 4 MG/ML IJ SOLN
INTRAMUSCULAR | Status: DC | PRN
  Administered 2015-03-22: 10 mg via INTRAVENOUS

## 2015-03-22 MED ORDER — ROCURONIUM BROMIDE 50 MG/5ML IV SOLN
INTRAVENOUS | Status: AC
  Filled 2015-03-22: qty 1

## 2015-03-22 MED ORDER — SODIUM CHLORIDE 0.9 % IR SOLN
Status: DC | PRN
  Administered 2015-03-22: 1

## 2015-03-22 MED ORDER — SUCCINYLCHOLINE CHLORIDE 20 MG/ML IJ SOLN
INTRAMUSCULAR | Status: AC
  Filled 2015-03-22: qty 1

## 2015-03-22 MED ORDER — CEFAZOLIN SODIUM-DEXTROSE 2-3 GM-% IV SOLR: 2.0000 g | INTRAVENOUS | Status: DC

## 2015-03-22 MED ORDER — CEFAZOLIN SODIUM-DEXTROSE 2-3 GM-% IV SOLR
INTRAVENOUS | Status: AC
  Filled 2015-03-22: qty 50

## 2015-03-22 MED ORDER — ONDANSETRON HCL 4 MG/2ML IJ SOLN
INTRAMUSCULAR | Status: DC | PRN
  Administered 2015-03-22: 4 mg via INTRAVENOUS

## 2015-03-22 MED ORDER — FENTANYL CITRATE (PF) 100 MCG/2ML IJ SOLN
INTRAMUSCULAR | Status: AC
  Filled 2015-03-22: qty 2

## 2015-03-22 MED ORDER — PROPOFOL 500 MG/50ML IV EMUL
INTRAVENOUS | Status: AC
  Filled 2015-03-22: qty 50

## 2015-03-22 MED ORDER — FENTANYL CITRATE (PF) 100 MCG/2ML IJ SOLN
INTRAMUSCULAR | Status: DC | PRN
  Administered 2015-03-22: 25 ug via INTRAVENOUS
  Administered 2015-03-22: 50 ug via INTRAVENOUS
  Administered 2015-03-22: 25 ug via INTRAVENOUS

## 2015-03-22 MED ORDER — SCOPOLAMINE 1 MG/3DAYS TD PT72
MEDICATED_PATCH | TRANSDERMAL | Status: AC
  Filled 2015-03-22: qty 1

## 2015-03-22 MED ORDER — CIPROFLOXACIN IN D5W 400 MG/200ML IV SOLN
INTRAVENOUS | Status: AC
  Filled 2015-03-22: qty 200

## 2015-03-22 MED ORDER — LACTATED RINGERS IV SOLN
INTRAVENOUS | Status: DC
  Administered 2015-03-22 (×2): via INTRAVENOUS

## 2015-03-22 MED ORDER — EPHEDRINE SULFATE 50 MG/ML IJ SOLN
INTRAMUSCULAR | Status: DC | PRN
  Administered 2015-03-22: 10 mg via INTRAVENOUS

## 2015-03-22 MED ORDER — MIDAZOLAM HCL 2 MG/2ML IJ SOLN: 1.0000 mg | INTRAMUSCULAR | Status: DC | PRN

## 2015-03-22 MED ORDER — FENTANYL CITRATE (PF) 100 MCG/2ML IJ SOLN
50.0000 ug | INTRAMUSCULAR | Status: DC | PRN
  Administered 2015-03-22 (×2): 50 ug via INTRAVENOUS

## 2015-03-22 MED ORDER — GLYCOPYRROLATE 0.2 MG/ML IJ SOLN
0.2000 mg | Freq: Once | INTRAMUSCULAR | Status: AC | PRN
  Administered 2015-03-22: 0.2 mg via INTRAVENOUS

## 2015-03-22 MED ORDER — BUPIVACAINE-EPINEPHRINE (PF) 0.5% -1:200000 IJ SOLN
INTRAMUSCULAR | Status: AC
  Filled 2015-03-22: qty 30

## 2015-03-22 MED ORDER — CIPROFLOXACIN IN D5W 400 MG/200ML IV SOLN: 400.0000 mg | Freq: Once | INTRAVENOUS | Status: DC

## 2015-03-22 MED ORDER — LIDOCAINE HCL (CARDIAC) 20 MG/ML IV SOLN
INTRAVENOUS | Status: DC | PRN
  Administered 2015-03-22: 60 mg via INTRAVENOUS

## 2015-03-22 MED ORDER — FENTANYL CITRATE (PF) 100 MCG/2ML IJ SOLN
INTRAMUSCULAR | Status: AC
  Filled 2015-03-22: qty 6

## 2015-03-22 MED ORDER — FENTANYL CITRATE (PF) 100 MCG/2ML IJ SOLN: 25.0000 ug | INTRAMUSCULAR | Status: DC | PRN

## 2015-03-22 MED ORDER — OXYCODONE HCL 5 MG PO TABS: 5.0000 mg | ORAL_TABLET | Freq: Once | ORAL | Status: DC | PRN

## 2015-03-22 MED ORDER — PROPOFOL 10 MG/ML IV BOLUS
INTRAVENOUS | Status: DC | PRN
  Administered 2015-03-22: 150 mg via INTRAVENOUS

## 2015-03-22 MED ORDER — BUPIVACAINE-EPINEPHRINE (PF) 0.5% -1:200000 IJ SOLN
INTRAMUSCULAR | Status: DC | PRN
  Administered 2015-03-22: 20 mL

## 2015-03-22 MED ORDER — SUCCINYLCHOLINE CHLORIDE 20 MG/ML IJ SOLN
INTRAMUSCULAR | Status: DC | PRN
  Administered 2015-03-22: 100 mg via INTRAVENOUS

## 2015-03-22 MED ORDER — SCOPOLAMINE 1 MG/3DAYS TD PT72
1.0000 | MEDICATED_PATCH | TRANSDERMAL | Status: DC
  Administered 2015-03-22: 1.5 mg via TRANSDERMAL

## 2015-03-22 SURGICAL SUPPLY — 42 items
APPLIER CLIP 5 13 M/L LIGAMAX5 (MISCELLANEOUS) ×3
BLADE CLIPPER SURG (BLADE) IMPLANT
CHLORAPREP W/TINT 26ML (MISCELLANEOUS) ×3 IMPLANT
CLIP APPLIE 5 13 M/L LIGAMAX5 (MISCELLANEOUS) ×2 IMPLANT
COVER MAYO STAND STRL (DRAPES) IMPLANT
DECANTER SPIKE VIAL GLASS SM (MISCELLANEOUS) IMPLANT
DRAPE C-ARM 42X72 X-RAY (DRAPES) IMPLANT
DRAPE LAPAROSCOPIC ABDOMINAL (DRAPES) IMPLANT
ELECT REM PT RETURN 9FT ADLT (ELECTROSURGICAL) ×3
ELECTRODE REM PT RTRN 9FT ADLT (ELECTROSURGICAL) ×2 IMPLANT
FILTER SMOKE EVAC LAPAROSHD (FILTER) IMPLANT
GLOVE BIOGEL PI IND STRL 7.0 (GLOVE) ×4 IMPLANT
GLOVE BIOGEL PI IND STRL 7.5 (GLOVE) ×2 IMPLANT
GLOVE BIOGEL PI INDICATOR 7.0 (GLOVE) ×2
GLOVE BIOGEL PI INDICATOR 7.5 (GLOVE) ×1
GLOVE EXAM NITRILE LRG STRL (GLOVE) ×6 IMPLANT
GLOVE SURG SIGNA 7.5 PF LTX (GLOVE) ×3 IMPLANT
GLOVE SURG SS PI 6.5 STRL IVOR (GLOVE) ×6 IMPLANT
GLOVE SURG SS PI 7.0 STRL IVOR (GLOVE) ×6 IMPLANT
GLOVE SURG SS PI 7.5 STRL IVOR (GLOVE) ×3 IMPLANT
GOWN STRL REUS W/ TWL LRG LVL3 (GOWN DISPOSABLE) ×6 IMPLANT
GOWN STRL REUS W/ TWL XL LVL3 (GOWN DISPOSABLE) ×2 IMPLANT
GOWN STRL REUS W/TWL LRG LVL3 (GOWN DISPOSABLE) ×3
GOWN STRL REUS W/TWL XL LVL3 (GOWN DISPOSABLE) ×1
HEMOSTAT SNOW SURGICEL 2X4 (HEMOSTASIS) IMPLANT
LIQUID BAND (GAUZE/BANDAGES/DRESSINGS) ×3 IMPLANT
PACK BASIN DAY SURGERY FS (CUSTOM PROCEDURE TRAY) ×3 IMPLANT
POUCH SPECIMEN RETRIEVAL 10MM (ENDOMECHANICALS) ×3 IMPLANT
SCISSORS LAP 5X35 DISP (ENDOMECHANICALS) ×3 IMPLANT
SET CHOLANGIOGRAPH 5 50 .035 (SET/KITS/TRAYS/PACK) IMPLANT
SET IRRIG TUBING LAPAROSCOPIC (IRRIGATION / IRRIGATOR) ×3 IMPLANT
SLEEVE ENDOPATH XCEL 5M (ENDOMECHANICALS) ×6 IMPLANT
SLEEVE SCD COMPRESS KNEE MED (MISCELLANEOUS) ×3 IMPLANT
SPECIMEN JAR SMALL (MISCELLANEOUS) ×3 IMPLANT
SUT MON AB 4-0 PC3 18 (SUTURE) ×3 IMPLANT
SUT VICRYL 0 UR6 27IN ABS (SUTURE) IMPLANT
TOWEL OR 17X24 6PK STRL BLUE (TOWEL DISPOSABLE) ×3 IMPLANT
TRAY LAPAROSCOPIC (CUSTOM PROCEDURE TRAY) ×3 IMPLANT
TROCAR XCEL BLUNT TIP 100MML (ENDOMECHANICALS) ×3 IMPLANT
TROCAR XCEL NON-BLD 5MMX100MML (ENDOMECHANICALS) ×3 IMPLANT
TUBE CONNECTING 20X1/4 (TUBING) IMPLANT
TUBING INSUFFLATION (TUBING) ×3 IMPLANT

## 2015-03-22 NOTE — Discharge Instructions (Signed)
[facility] ______[facility] °LAPAROSCOPIC SURGERY: POST OP INSTRUCTIONS °Always review your discharge instruction sheet given to you by the facility where your surgery was performed. °IF YOU HAVE DISABILITY OR FAMILY LEAVE FORMS, YOU MUST BRING THEM TO THE OFFICE FOR PROCESSING.   °DO NOT GIVE THEM TO YOUR DOCTOR. ° °1. A prescription for pain medication may be given to you upon discharge.  Take your pain medication as prescribed, if needed.  If narcotic pain medicine is not needed, then you may take acetaminophen (Tylenol) or ibuprofen (Advil) as needed. °2. Take your usually prescribed medications unless otherwise directed. °3. If you need a refill on your pain medication, please contact your pharmacy.  They will contact our office to request authorization. Prescriptions will not be filled after 5pm or on week-ends. °4. You should follow a light diet the first few days after arrival home, such as soup and crackers, etc.  Be sure to include lots of fluids daily. °5. Most patients will experience some swelling and bruising in the area of the incisions.  Ice packs will help.  Swelling and bruising can take several days to resolve.  °6. It is common to experience some constipation if taking pain medication after surgery.  Increasing fluid intake and taking a stool softener (such as Colace) will usually help or prevent this problem from occurring.  A mild laxative (Milk of Magnesia or Miralax) should be taken according to package instructions if there are no bowel movements after 48 hours. °7. Unless discharge instructions indicate otherwise, you may remove your bandages 24-48 hours after surgery, and you may shower at that time.  You may have steri-strips (small skin tapes) in place directly over the incision.  These strips should be left on the skin for 7-10 days.  If your surgeon used skin glue on the incision, you may shower in 24 hours.  The glue will flake off over the next 2-3 weeks.  Any sutures or  staples will be removed at the office during your follow-up visit. °8. ACTIVITIES:  You may resume regular (light) daily activities beginning the next day--such as daily self-care, walking, climbing stairs--gradually increasing activities as tolerated.  You may have sexual intercourse when it is comfortable.  Refrain from any heavy lifting or straining until approved by your doctor. °a. You may drive when you are no longer taking prescription pain medication, you can comfortably wear a seatbelt, and you can safely maneuver your car and apply brakes. °b. RETURN TO WORK:  __________________________________________________________ °9. You should see your doctor in the office for a follow-up appointment approximately 2-3 weeks after your surgery.  Make sure that you call for this appointment within a day or two after you arrive home to insure a convenient appointment time. °10. OTHER INSTRUCTIONS: __________________________________________________________________________________________________________________________ __________________________________________________________________________________________________________________________ °WHEN TO CALL YOUR DOCTOR: °1. Fever over 101.0 °2. Inability to urinate °3. Continued bleeding from incision. °4. Increased pain, redness, or drainage from the incision. °5. Increasing abdominal pain ° °The clinic staff is available to answer your questions during regular business hours.  Please don’t hesitate to call and ask to speak to one of the nurses for clinical concerns.  If you have a medical emergency, go to the nearest emergency room or call 911.  A surgeon from [facility] is always on call at the hospital. °[address], [address], [place], [place]  [address] ? [address] [address], [place], [place]   [address] °[phone] ? [phone] ? FAX [phone] °Web site:   [email or web address] ° °Post Anesthesia Home Care Instructions ° °Activity: °Get  plenty of rest for the remainder of the day. A responsible adult should stay with you for 24 hours following the procedure.  °For the next 24 hours, DO NOT: °-Drive a car °-Operate machinery °-Drink alcoholic beverages °-Take any medication unless instructed by your physician °-Make any legal decisions or sign important papers. ° °Meals: °Start with liquid foods such as gelatin or soup. Progress to regular foods as tolerated. Avoid greasy, spicy, heavy foods. If nausea and/or vomiting occur, drink only clear liquids until the nausea and/or vomiting subsides. Call your physician if vomiting continues. ° °Special Instructions/Symptoms: °Your throat may feel dry or sore from the anesthesia or the breathing tube placed in your throat during surgery. If this causes discomfort, gargle with warm salt water. The discomfort should disappear within 24 hours. ° °If you had a scopolamine patch placed behind your ear for the management of post- operative nausea and/or vomiting: ° °1. The medication in the patch is effective for 72 hours, after which it should be removed.  Wrap patch in a tissue and discard in the trash. Wash hands thoroughly with soap and water. °2. You may remove the patch earlier than 72 hours if you experience unpleasant side effects which may include dry mouth, dizziness or visual disturbances. °3. Avoid touching the patch. Wash your hands with soap and water after contact with the patch. °  ° °

## 2015-03-22 NOTE — Op Note (Signed)
Laparoscopic Cholecystectomy Procedure Note  Indications: This patient presents with symptomatic gallbladder disease and will undergo laparoscopic cholecystectomy.  Pre-operative Diagnosis: Calculus of gallbladder without mention of cholecystitis or obstruction  Post-operative Diagnosis: Same  Surgeon: Abigail MiyamotoBLACKMAN,Seidy Labreck A   Assistants: 0  Anesthesia: General endotracheal anesthesia  ASA Class: 2  Procedure Details  The patient was seen again in the Holding Room. The risks, benefits, complications, treatment options, and expected outcomes were discussed with the patient. The possibilities of reaction to medication, pulmonary aspiration, perforation of viscus, bleeding, recurrent infection, finding a normal gallbladder, the need for additional procedures, failure to diagnose a condition, the possible need to convert to an open procedure, and creating a complication requiring transfusion or operation were discussed with the patient. The likelihood of improving the patient's symptoms with return to their baseline status is good.  The patient and/or family concurred with the proposed plan, giving informed consent. The site of surgery properly noted. The patient was taken to Operating Room, identified as Donne HazelKathleen S Zhang and the procedure verified as Laparoscopic Cholecystectomy with Intraoperative Cholangiogram. A Time Out was held and the above information confirmed.  Prior to the induction of general anesthesia, antibiotic prophylaxis was administered. General endotracheal anesthesia was then administered and tolerated well. After the induction, the abdomen was prepped with Chloraprep and draped in sterile fashion. The patient was positioned in the supine position.  Local anesthetic agent was injected into the skin near the umbilicus and an incision made. We dissected down to the abdominal fascia with blunt dissection.  The fascia was incised vertically and we entered the peritoneal cavity bluntly.   A pursestring suture of 0-Vicryl was placed around the fascial opening.  The Hasson cannula was inserted and secured with the stay suture.  Pneumoperitoneum was then created with CO2 and tolerated well without any adverse changes in the patient's vital signs. An 11-mm port was placed in the subxiphoid position.  Two 5-mm ports were placed in the right upper quadrant. All skin incisions were infiltrated with a local anesthetic agent before making the incision and placing the trocars.   We positioned the patient in reverse Trendelenburg, tilted slightly to the patient's left.  The gallbladder was identified, the fundus grasped and retracted cephalad. Adhesions were lysed bluntly and with the electrocautery where indicated, taking care not to injure any adjacent organs or viscus. There were a large amount of adhesions from the lower midline down to the pelvis which I left in place.  The infundibulum was grasped and retracted laterally, exposing the peritoneum overlying the triangle of Calot. This was then divided and exposed in a blunt fashion. The cystic duct was clearly identified and bluntly dissected circumferentially. A critical view of the cystic duct and cystic artery was obtained.  The cystic duct was then ligated with clips and divided. The cystic artery was, dissected free, ligated with clips and divided as well.   The gallbladder was dissected from the liver bed in retrograde fashion with the electrocautery. The gallbladder was removed and placed in an Endocatch sac. The liver bed was irrigated and inspected. Hemostasis was achieved with the electrocautery. Copious irrigation was utilized and was repeatedly aspirated until clear.  The gallbladder and Endocatch sac were then removed through the umbilical port site.  The pursestring suture was used to close the umbilical fascia.    We again inspected the right upper quadrant for hemostasis.  Pneumoperitoneum was released as we removed the trocars.  4-0  Monocryl was used to close the  skin.   Skin glue was then applied. The patient was then extubated and brought to the recovery room in stable condition. Instrument, sponge, and needle counts were correct at closure and at the conclusion of the case.   Findings: Cholecystitis with Cholelithiasis. There was a large amount of omentum fixed to the lower midline and pelvis so I was not able to do any lysis of adhesions  Estimated Blood Loss: Minimal         Drains: 0         Specimens: Gallbladder           Complications: None; patient tolerated the procedure well.         Disposition: PACU - hemodynamically stable.         Condition: stable

## 2015-03-22 NOTE — Interval H&P Note (Signed)
History and Physical Interval Note: no change in H and P  03/22/2015 7:01 AM  Jennifer Calhoun  has presented today for surgery, with the diagnosis of Cholelithiasis  The various methods of treatment have been discussed with the patient and family. After consideration of risks, benefits and other options for treatment, the patient has consented to  Procedure(s): LAPAROSCOPIC CHOLECYSTECTOMY WITH POSSIBLE INTRAOPERATIVE CHOLANGIOGRAM (N/A) as a surgical intervention .  The patient's history has been reviewed, patient examined, no change in status, stable for surgery.  I have reviewed the patient's chart and labs.  Questions were answered to the patient's satisfaction.     Lilie Vezina A

## 2015-03-22 NOTE — Transfer of Care (Signed)
Immediate Anesthesia Transfer of Care Note  Patient: Jennifer Calhoun  Procedure(s) Performed: Procedure(s): LAPAROSCOPIC CHOLECYSTECTOMY  (N/A)  Patient Location: PACU  Anesthesia Type:General  Level of Consciousness: awake, alert , oriented and patient cooperative  Airway & Oxygen Therapy: Patient Spontanous Breathing and Patient connected to face mask oxygen  Post-op Assessment: Report given to RN and Post -op Vital signs reviewed and stable  Post vital signs: Reviewed and stable  Last Vitals:  Filed Vitals:   03/22/15 0644  BP: 147/66  Pulse: 76  Temp: 36.8 C  Resp: 16    Complications: No apparent anesthesia complications

## 2015-03-22 NOTE — Anesthesia Postprocedure Evaluation (Signed)
  Anesthesia Post-op Note  Patient: Jennifer Calhoun  Procedure(s) Performed: Procedure(s): LAPAROSCOPIC CHOLECYSTECTOMY  (N/A)  Patient Location: PACU  Anesthesia Type:General  Level of Consciousness: awake  Airway and Oxygen Therapy: Patient Spontanous Breathing  Post-op Pain: mild  Post-op Assessment: Post-op Vital signs reviewed, Patient's Cardiovascular Status Stable, Respiratory Function Stable, Patent Airway, No signs of Nausea or vomiting and Pain level controlled  Post-op Vital Signs: Reviewed and stable  Last Vitals:  Filed Vitals:   03/22/15 0941  BP: 111/57  Pulse: 84  Temp: 36.4 C  Resp: 18    Complications: No apparent anesthesia complications

## 2015-03-22 NOTE — Anesthesia Preprocedure Evaluation (Addendum)
Anesthesia Evaluation  Patient identified by MRN, date of birth, ID band Patient awake    Reviewed: Allergy & Precautions, NPO status , Patient's Chart, lab work & pertinent test results  History of Anesthesia Complications Negative for: history of anesthetic complications  Airway Mallampati: II  TM Distance: >3 FB Neck ROM: Full    Dental  (+) Teeth Intact   Pulmonary neg shortness of breath, neg sleep apnea, neg COPDneg recent URI, former smoker,  breath sounds clear to auscultation        Cardiovascular - angina- Past MI and - CHF Rhythm:Regular  Orthostatic hypotension   Neuro/Psych TIAnegative psych ROS   GI/Hepatic negative GI ROS, Neg liver ROS,   Endo/Other  negative endocrine ROS  Renal/GU negative Renal ROS     Musculoskeletal   Abdominal   Peds  Hematology negative hematology ROS (+)   Anesthesia Other Findings   Reproductive/Obstetrics                            Anesthesia Physical Anesthesia Plan  ASA: II  Anesthesia Plan: General   Post-op Pain Management:    Induction: Intravenous  Airway Management Planned: Oral ETT  Additional Equipment: None  Intra-op Plan:   Post-operative Plan: Extubation in OR  Informed Consent: I have reviewed the patients History and Physical, chart, labs and discussed the procedure including the risks, benefits and alternatives for the proposed anesthesia with the patient or authorized representative who has indicated his/her understanding and acceptance.   Dental advisory given  Plan Discussed with: CRNA and Surgeon  Anesthesia Plan Comments:         Anesthesia Quick Evaluation

## 2015-03-22 NOTE — Anesthesia Procedure Notes (Signed)
Procedure Name: Intubation Date/Time: 03/22/2015 7:49 AM Performed by: Nakayla Rorabaugh D Pre-anesthesia Checklist: Patient identified, Emergency Drugs available, Suction available and Patient being monitored Patient Re-evaluated:Patient Re-evaluated prior to inductionOxygen Delivery Method: Circle System Utilized Preoxygenation: Pre-oxygenation with 100% oxygen Intubation Type: IV induction Ventilation: Mask ventilation without difficulty Laryngoscope Size: Mac and 3 Grade View: Grade II Tube type: Oral Tube size: 7.0 mm Number of attempts: 1 Airway Equipment and Method: Stylet and Oral airway Placement Confirmation: ETT inserted through vocal cords under direct vision,  positive ETCO2 and breath sounds checked- equal and bilateral Secured at: 21 cm Tube secured with: Tape Dental Injury: Teeth and Oropharynx as per pre-operative assessment

## 2015-03-23 ENCOUNTER — Encounter (HOSPITAL_BASED_OUTPATIENT_CLINIC_OR_DEPARTMENT_OTHER): Payer: Self-pay | Admitting: Surgery

## 2015-08-10 ENCOUNTER — Encounter (INDEPENDENT_AMBULATORY_CARE_PROVIDER_SITE_OTHER): Payer: Medicare Other | Admitting: Ophthalmology

## 2015-08-10 DIAGNOSIS — H43813 Vitreous degeneration, bilateral: Secondary | ICD-10-CM | POA: Diagnosis not present

## 2015-08-10 DIAGNOSIS — H3521 Other non-diabetic proliferative retinopathy, right eye: Secondary | ICD-10-CM

## 2015-08-10 DIAGNOSIS — H3531 Nonexudative age-related macular degeneration: Secondary | ICD-10-CM

## 2015-09-10 ENCOUNTER — Encounter (INDEPENDENT_AMBULATORY_CARE_PROVIDER_SITE_OTHER): Payer: Medicare Other | Admitting: Ophthalmology

## 2015-09-26 ENCOUNTER — Encounter (INDEPENDENT_AMBULATORY_CARE_PROVIDER_SITE_OTHER): Payer: Medicare Other | Admitting: Ophthalmology

## 2015-10-01 ENCOUNTER — Encounter (INDEPENDENT_AMBULATORY_CARE_PROVIDER_SITE_OTHER): Payer: Medicare Other | Admitting: Ophthalmology

## 2015-10-01 DIAGNOSIS — H43813 Vitreous degeneration, bilateral: Secondary | ICD-10-CM | POA: Diagnosis not present

## 2015-10-01 DIAGNOSIS — H353132 Nonexudative age-related macular degeneration, bilateral, intermediate dry stage: Secondary | ICD-10-CM | POA: Diagnosis not present

## 2016-01-29 ENCOUNTER — Ambulatory Visit (INDEPENDENT_AMBULATORY_CARE_PROVIDER_SITE_OTHER): Payer: Medicare HMO | Admitting: Ophthalmology

## 2016-01-29 DIAGNOSIS — H2703 Aphakia, bilateral: Secondary | ICD-10-CM

## 2016-01-29 DIAGNOSIS — H3561 Retinal hemorrhage, right eye: Secondary | ICD-10-CM | POA: Diagnosis not present

## 2016-01-29 DIAGNOSIS — H43813 Vitreous degeneration, bilateral: Secondary | ICD-10-CM

## 2016-01-29 DIAGNOSIS — H353132 Nonexudative age-related macular degeneration, bilateral, intermediate dry stage: Secondary | ICD-10-CM

## 2016-06-25 ENCOUNTER — Emergency Department (HOSPITAL_COMMUNITY): Payer: Medicare HMO

## 2016-06-25 ENCOUNTER — Emergency Department (HOSPITAL_COMMUNITY)
Admission: EM | Admit: 2016-06-25 | Discharge: 2016-06-25 | Disposition: A | Discharge status: Home / Self Care | Payer: Medicare HMO | Attending: Emergency Medicine | Admitting: Emergency Medicine

## 2016-06-25 ENCOUNTER — Encounter (HOSPITAL_COMMUNITY): Payer: Self-pay

## 2016-06-25 DIAGNOSIS — Z7982 Long term (current) use of aspirin: Secondary | ICD-10-CM | POA: Diagnosis not present

## 2016-06-25 DIAGNOSIS — Z8673 Personal history of transient ischemic attack (TIA), and cerebral infarction without residual deficits: Secondary | ICD-10-CM | POA: Diagnosis not present

## 2016-06-25 DIAGNOSIS — Z87891 Personal history of nicotine dependence: Secondary | ICD-10-CM | POA: Diagnosis not present

## 2016-06-25 DIAGNOSIS — M546 Pain in thoracic spine: Secondary | ICD-10-CM | POA: Insufficient documentation

## 2016-06-25 DIAGNOSIS — M549 Dorsalgia, unspecified: Secondary | ICD-10-CM

## 2016-06-25 DIAGNOSIS — Z91040 Latex allergy status: Secondary | ICD-10-CM | POA: Diagnosis not present

## 2016-06-25 DIAGNOSIS — M25512 Pain in left shoulder: Secondary | ICD-10-CM | POA: Diagnosis present

## 2016-06-25 LAB — CBC
HCT: 39 % (ref 36.0–46.0)
Hemoglobin: 12.6 g/dL (ref 12.0–15.0)
MCH: 29 pg (ref 26.0–34.0)
MCHC: 32.3 g/dL (ref 30.0–36.0)
MCV: 89.7 fL (ref 78.0–100.0)
Platelets: 185 K/uL (ref 150–400)
RBC: 4.35 MIL/uL (ref 3.87–5.11)
RDW: 14 % (ref 11.5–15.5)
WBC: 5 K/uL (ref 4.0–10.5)

## 2016-06-25 LAB — BASIC METABOLIC PANEL WITH GFR
Anion gap: 9 (ref 5–15)
BUN: 13 mg/dL (ref 6–20)
CO2: 23 mmol/L (ref 22–32)
Calcium: 9.3 mg/dL (ref 8.9–10.3)
Chloride: 101 mmol/L (ref 101–111)
Creatinine, Ser: 0.89 mg/dL (ref 0.44–1.00)
GFR calc Af Amer: >60 mL/min
GFR calc non Af Amer: >60 mL/min
Glucose, Bld: 90 mg/dL (ref 65–99)
Potassium: 4.4 mmol/L (ref 3.5–5.1)
Sodium: 133 mmol/L — ABNORMAL LOW (ref 135–145)

## 2016-06-25 LAB — I-STAT TROPONIN, ED: Troponin i, poc: 0 ng/mL (ref 0.00–0.08)

## 2016-06-25 IMAGING — DX DG CHEST 2V
2 series · 2 of 2 positions shown · non-contrast
Comparison: [DATE]

CLINICAL DATA: Anterior left side chest and left shoulder pain for
1 day

EXAM:
CHEST  2 VIEW

[chest pa]
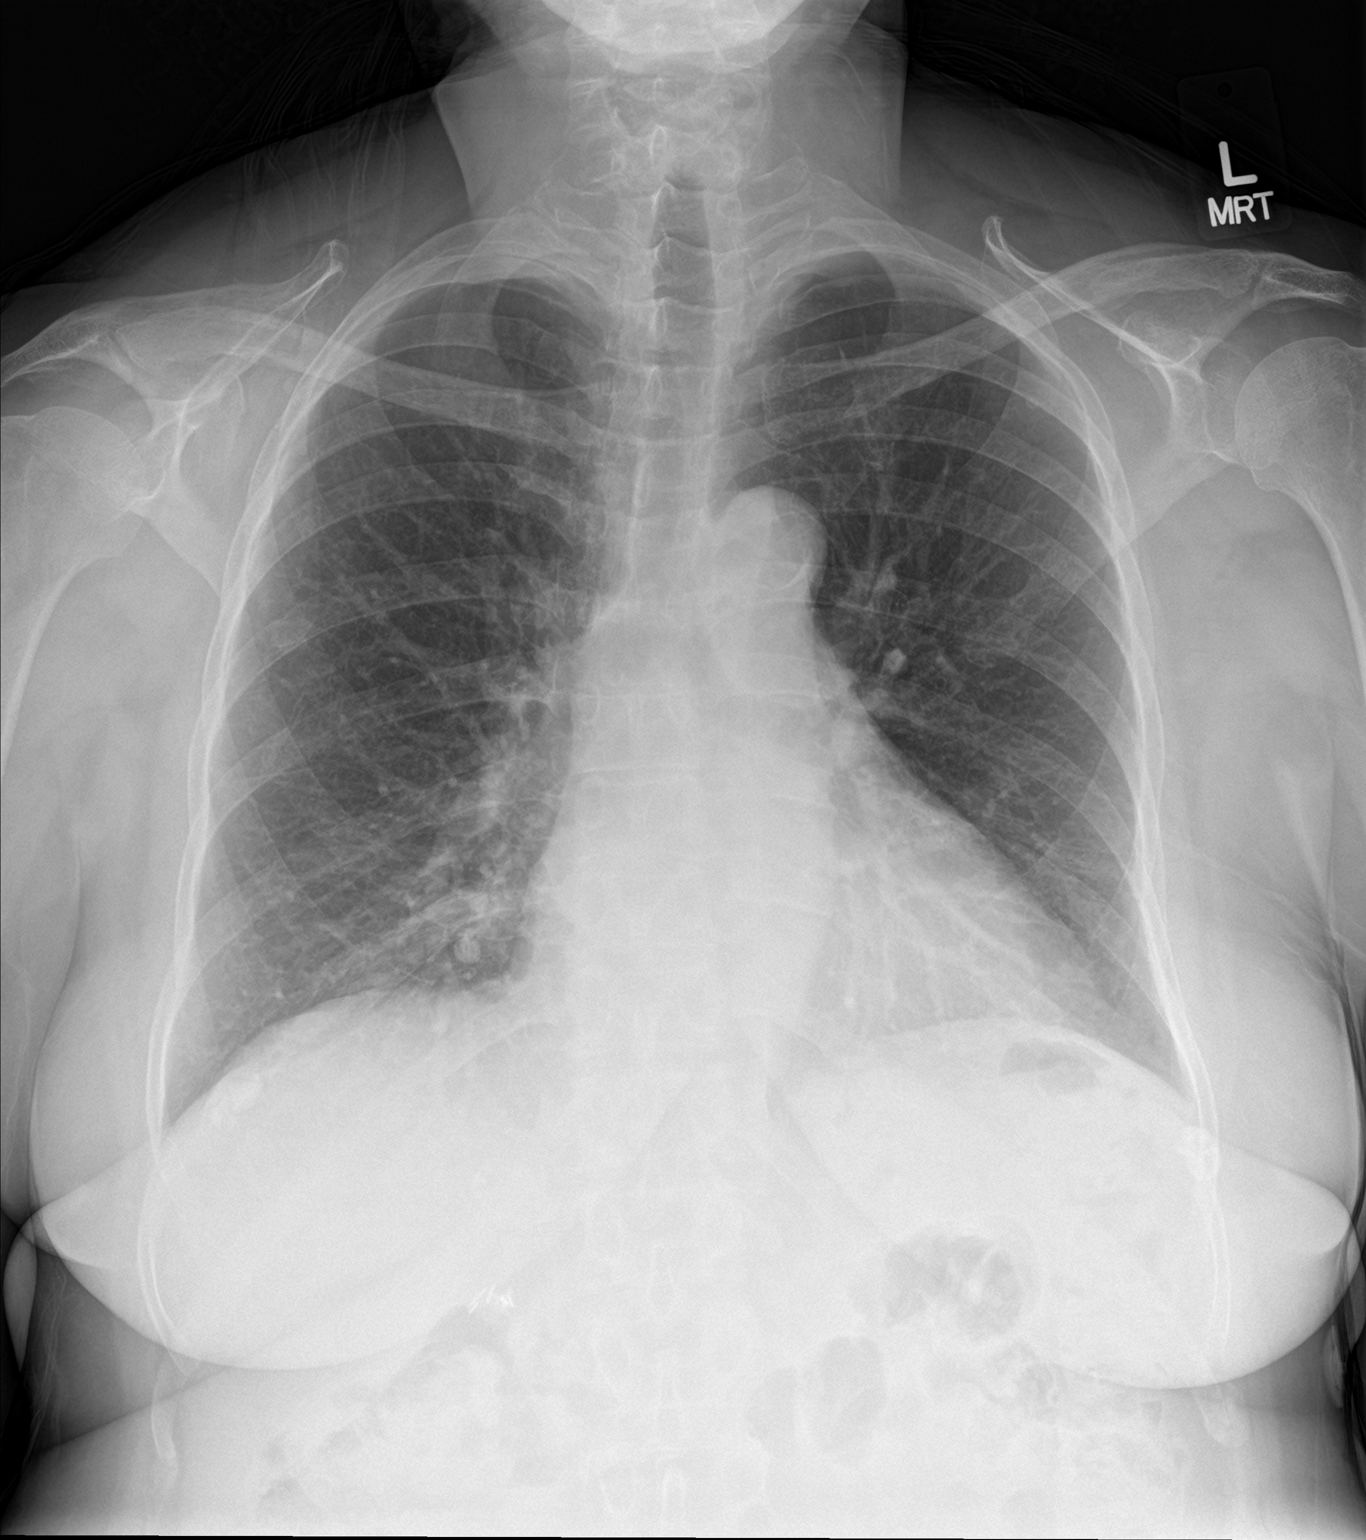

[chest lat]
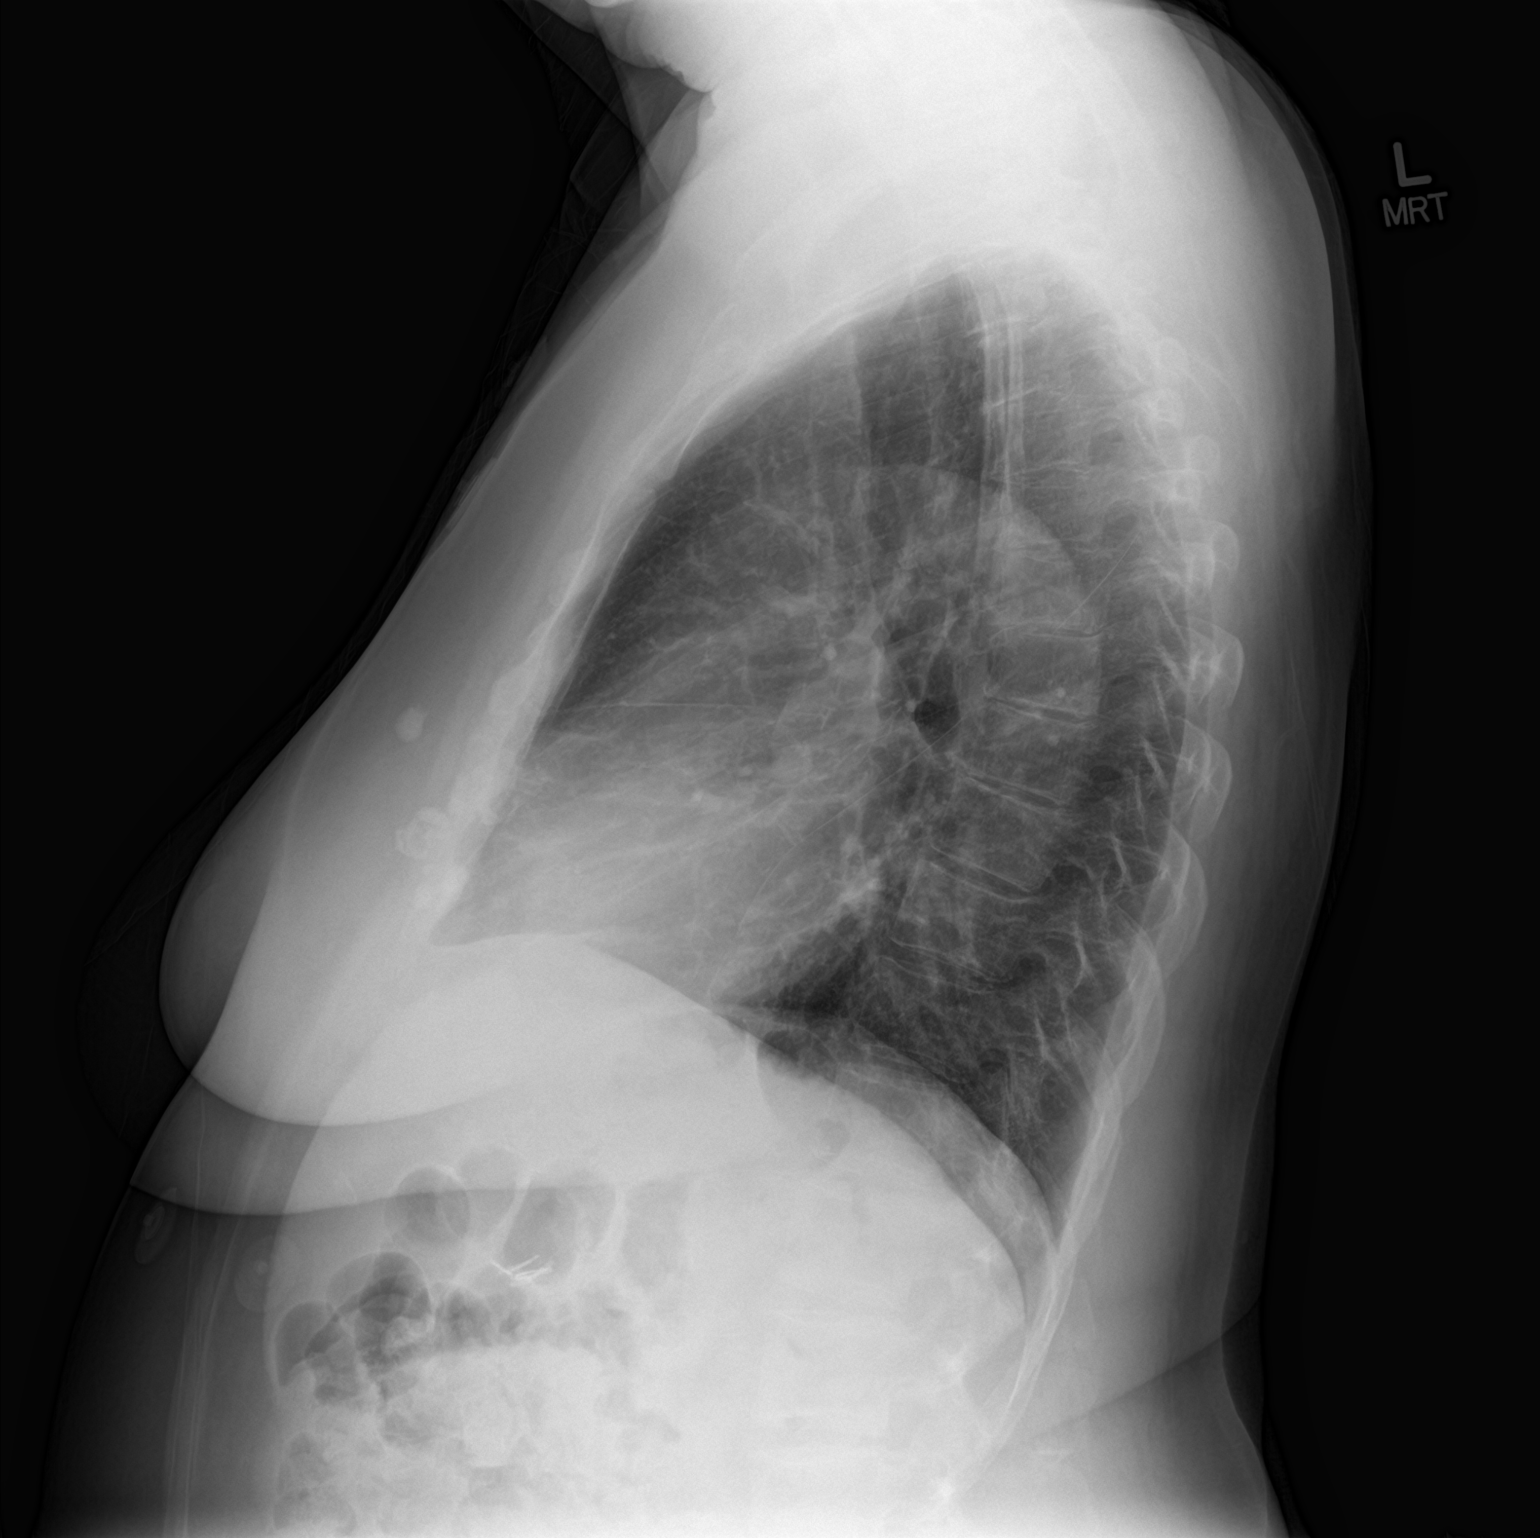

[2 of 2 positions shown; findings below may reference images not displayed]

FINDINGS: Heart is upper limits normal in size. Lungs are clear. No effusions.
No acute bony abnormality.
IMPRESSION: No active cardiopulmonary disease.

## 2016-06-25 IMAGING — CT CT ANGIO CHEST
2 of 6 series · 18 of 36 positions shown · IV contrast (Omni 300)
Comparison: CT scan of [DATE]. Ultrasound [DATE].

CLINICAL DATA: Chest pain.

EXAM:
CT ANGIOGRAPHY CHEST WITH CONTRAST
TECHNIQUE: Multidetector CT imaging of the chest was performed using the
standard protocol during bolus administration of intravenous
contrast. Multiplanar CT image reconstructions and MIPs were
obtained to evaluate the vascular anatomy.
CONTRAST:  100 mL of Isovue 370 intravenously.

[Series 6: pe thins · axial · 0.60mm/px · z∈[-484,-256]mm · 17 of 257 slices shown]
[im 15/257  lung]
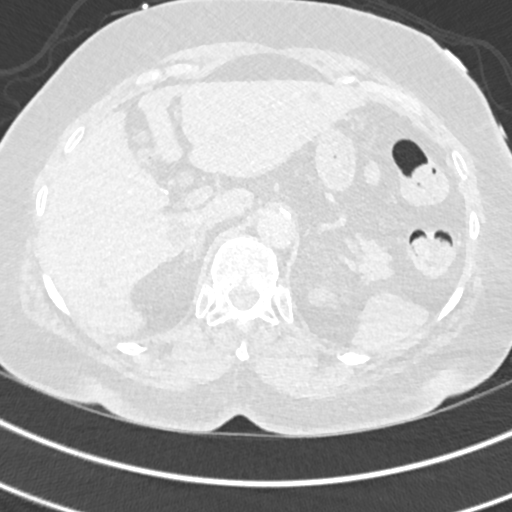
[im 29/257  mediastinal]
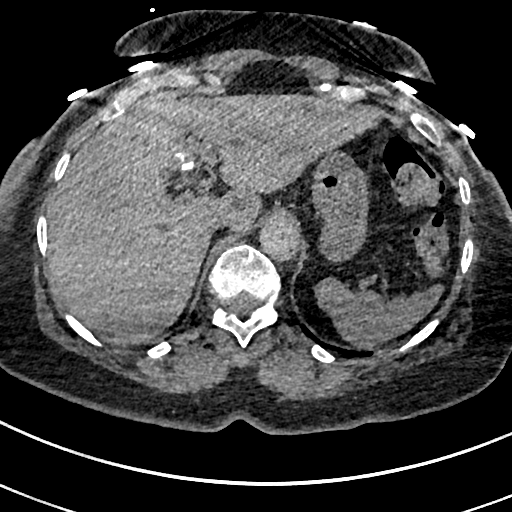
[im 43/257  lung]
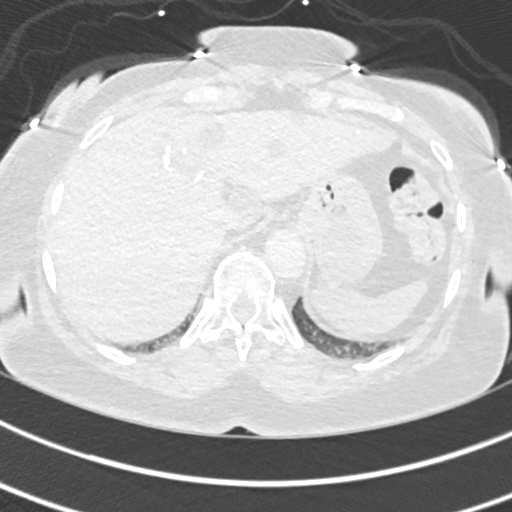
[im 57/257  mediastinal]
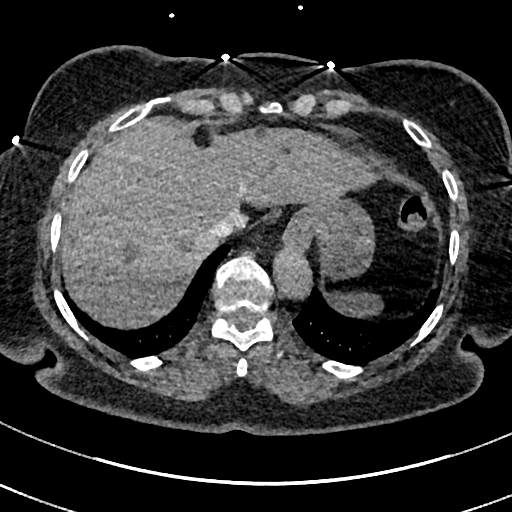
[im 72/257  lung]
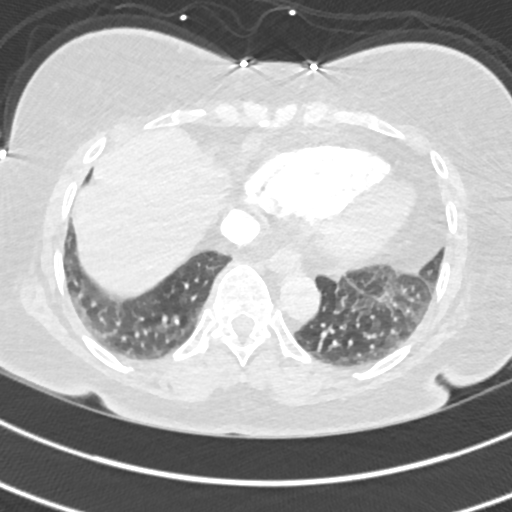
[im 86/257  mediastinal]
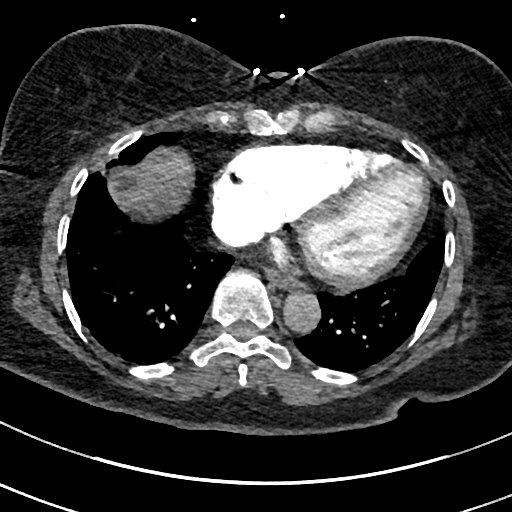
[im 100/257  lung]
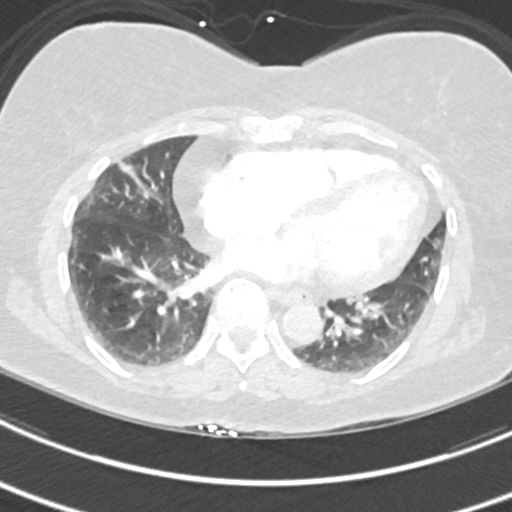
[im 114/257  mediastinal]
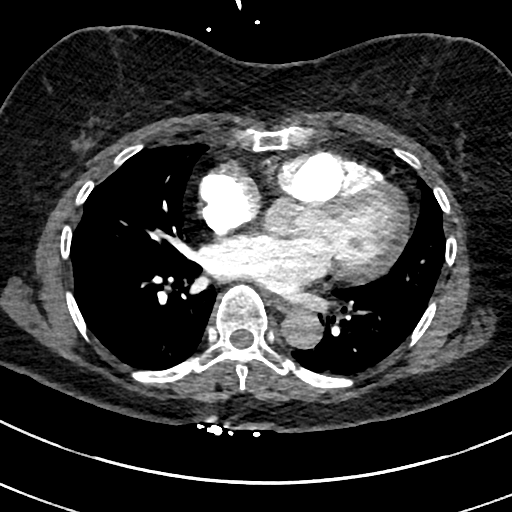
[im 129/257  lung]
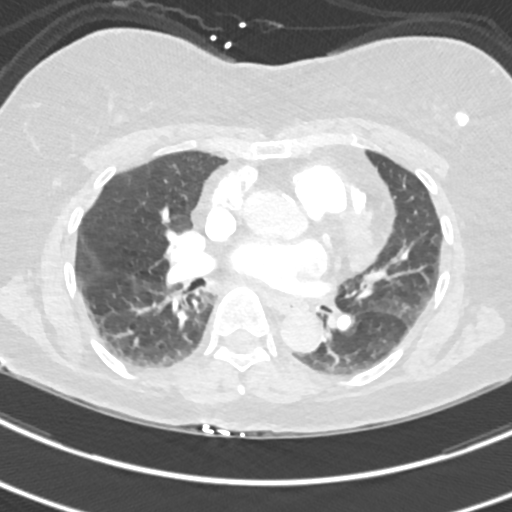
[im 143/257  mediastinal]
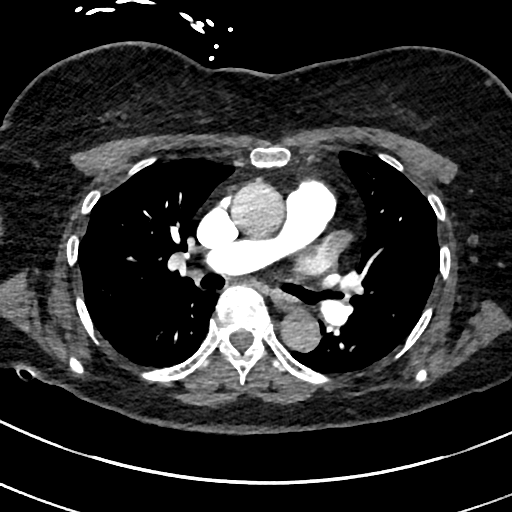
[im 157/257  lung]
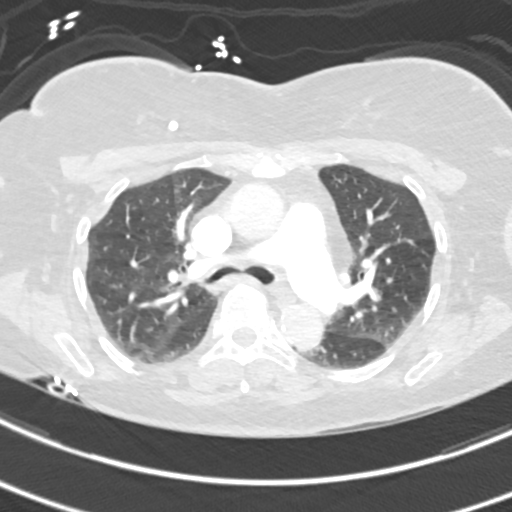
[im 171/257  mediastinal]
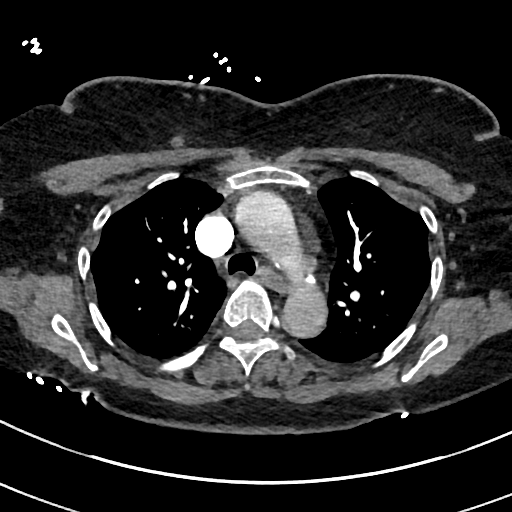
[im 185/257  lung]
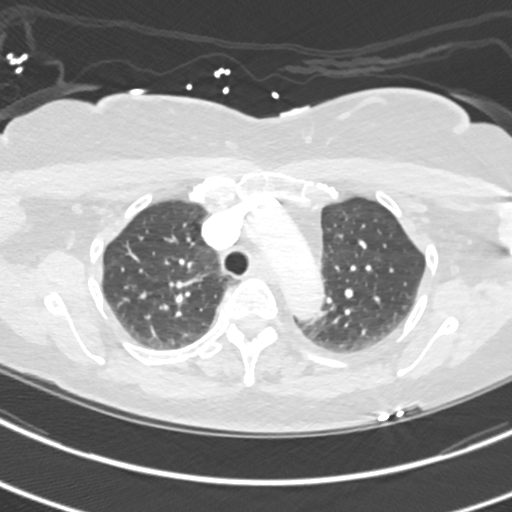
[im 200/257  mediastinal]
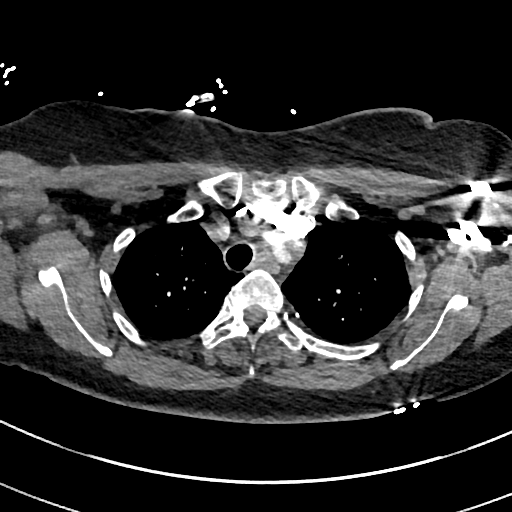
[im 214/257  lung]
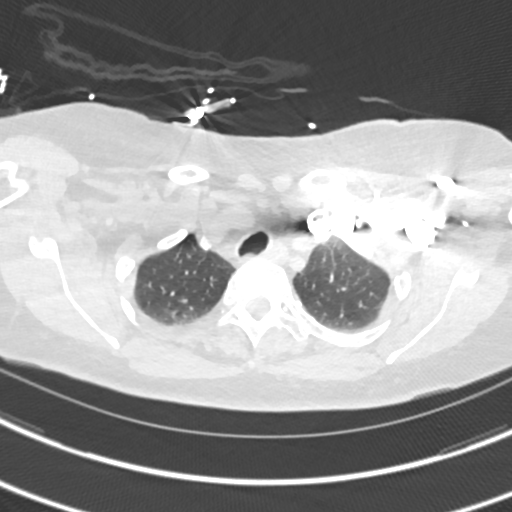
[im 228/257  mediastinal]
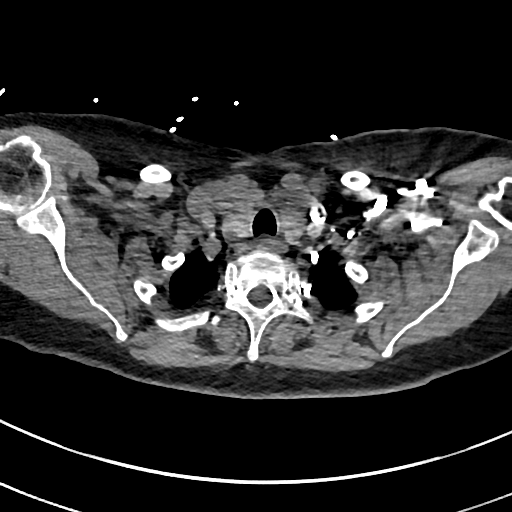
[im 242/257  lung]
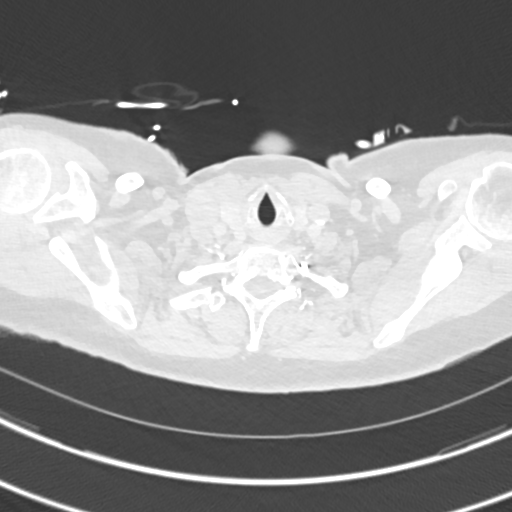

[Series 7: pe 2mm cor · coronal · 0.59mm/px · 1 of 114 slices shown]
[im 57/114  mediastinal]
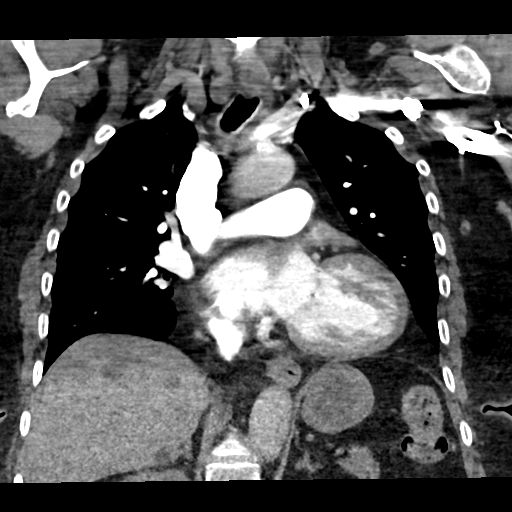

[18 of 36 positions shown; findings below may reference images not displayed]

FINDINGS: No pneumothorax or pleural effusion is noted. No acute pulmonary
disease is noted. There is no evidence of pulmonary embolus.
Atherosclerosis of thoracic aorta is noted without aneurysm. Stable
hepatic cysts are noted. Probable right upper pole renal cyst is
noted. No significant osseous abnormality is noted. No mediastinal
mass or adenopathy is noted.

Review of the MIP images confirms the above findings.
IMPRESSION: Aortic atherosclerosis. No evidence of pulmonary embolus. No acute
abnormality seen in the chest.

## 2016-06-25 MED ORDER — IOPAMIDOL (ISOVUE-370) INJECTION 76%
INTRAVENOUS | Status: AC
  Administered 2016-06-25: 100 mL
  Filled 2016-06-25: qty 100

## 2016-06-25 MED ORDER — PREDNISONE 10 MG PO TABS: 20.0000 mg | ORAL_TABLET | Freq: Every day | ORAL | 0 refills | Status: DC

## 2016-06-25 MED ORDER — IBUPROFEN 800 MG PO TABS: 800.0000 mg | ORAL_TABLET | Freq: Three times a day (TID) | ORAL | 0 refills | Status: DC

## 2016-06-25 MED ORDER — OXYCODONE-ACETAMINOPHEN 5-325 MG PO TABS
2.0000 | ORAL_TABLET | Freq: Once | ORAL | Status: AC
  Administered 2016-06-25: 2 via ORAL
  Filled 2016-06-25: qty 2

## 2016-06-25 MED ORDER — DIPHENHYDRAMINE HCL 50 MG/ML IJ SOLN
25.0000 mg | Freq: Once | INTRAMUSCULAR | Status: AC
  Administered 2016-06-25: 25 mg via INTRAVENOUS
  Filled 2016-06-25: qty 1

## 2016-06-25 NOTE — ED Triage Notes (Signed)
Pt reports left shoulder pain X3 days. Seen at [facility] and treated with muscle relaxer without improvement. Sent here by [facility] provider for cardiac workup.

## 2016-06-25 NOTE — ED Provider Notes (Signed)
[facility]-EMERGENCY DEPT Provider Note   CSN: [id number] Arrival date & time: 06/25/16  1045  First Provider Contact:  None       History   Chief Complaint Chief Complaint  Patient presents with  . Shoulder Pain    HPI Jennifer Calhoun is a 75 y.o. female.  HPI  75 yo female complaining of left shoulder pain began 3 days ago gradual onset.  Now mostly constant- waxes and wanes. No decreasing factors.  Worsens with nothing.  Seen at urgent care and given muscle relaxers which arent't helping.  States only able to sleep last night after ativan and brandy,  No chest pain, dyspnea, no wounds, no known neck problems.   Past Medical History:  Diagnosis Date  . History of small bowel obstruction    x3  . History of TIA (transient ischemic attack) 2003  . Hyperlipemia   . Seasonal allergies   . Wears glasses     There are no active problems to display for this patient.   Past Surgical History:  Procedure Laterality Date  . CATARACT EXTRACTION     both eyes  . CHOLECYSTECTOMY N/A 03/22/2015   Procedure: LAPAROSCOPIC CHOLECYSTECTOMY ;  Surgeon: Abigail Miyamoto, MD;  Location: [facility];  Service: General;  Laterality: N/A;  . COLONOSCOPY    . FEMORAL HERNIA REPAIR  2000   small bowel resection-abstruction  . TOTAL ABDOMINAL HYSTERECTOMY      OB History    No data available       Home Medications    Prior to Admission medications   Medication Sig Start Date End Date Taking? Authorizing Provider  aspirin EC 81 MG tablet Take 81 mg by mouth daily.    Historical Provider, MD  atorvastatin (LIPITOR) 20 MG tablet Take 20 mg by mouth daily.    Historical Provider, MD  HYDROcodone-acetaminophen (NORCO) 5-325 MG per tablet Take 1-2 tablets by mouth every 4 (four) hours as needed. 03/22/15   Abigail Miyamoto, MD  loratadine (CLARITIN) 10 MG tablet Take 10 mg by mouth daily.    Historical Provider, MD  Probiotic Product (PROBIOTIC & ACIDOPHILUS EX ST PO) Take by  mouth.    Historical Provider, MD    Family History History reviewed. No pertinent family history.  Social History Social History  Substance Use Topics  . Smoking status: Former Smoker    Quit date: 03/20/1972  . Smokeless tobacco: Never Used  . Alcohol use Yes     Comment: glass wine nightly     Allergies   Penicillins and Latex   Review of Systems Review of Systems  All other systems reviewed and are negative.    Physical Exam Updated Vital Signs BP 165/79 (BP Location: Right Arm)   Pulse 66   Temp 98.1 F (36.7 C) (Oral)   Resp 16   SpO2 98%   Physical Exam  Constitutional: She appears well-developed and well-nourished. No distress.  HENT:  Head: Normocephalic and atraumatic.  Eyes: EOM are normal. Pupils are equal, round, and reactive to light.  Neck: Normal range of motion. Neck supple.  Cardiovascular: Normal rate, regular rhythm, normal heart sounds and intact distal pulses.   Pulmonary/Chest: Effort normal and breath sounds normal.      ttp right upper back in area of pain but not definitively reproducing pain  Abdominal: Soft. Bowel sounds are normal.  Musculoskeletal: Normal range of motion.  Neurological: She is alert. She has normal reflexes.  Skin: Skin is warm.  Psychiatric: She has a normal mood and affect.  Nursing note and vitals reviewed.    ED Treatments / Results  Labs (all labs ordered are listed, but only abnormal results are displayed) Labs Reviewed  BASIC METABOLIC PANEL - Abnormal; Notable for the following:       Result Value   Sodium 133 (*)    All other components within normal limits  CBC  I-STAT TROPOININ, ED    EKG  EKG Interpretation  Date/Time:  Wednesday June 25 2016 11:26:15 EDT Ventricular Rate:  71 PR Interval:  166 QRS Duration: 76 QT Interval:  398 QTC Calculation: 432 R Axis:   56 Text Interpretation:  Normal sinus rhythm Normal ECG Confirmed by Jose Alleyne MD, Chiara Coltrin ([id number]) on 06/25/2016 3:50:01 PM        Radiology No results found.  Procedures Procedures (including critical care time)  Medications Ordered in ED Medications - No data to display   Initial Impression / Assessment and Plan / ED Course  I have reviewed the triage vital signs and the nursing notes.  Pertinent labs & imaging results that were available during my care of the patient were reviewed by me and considered in my medical decision making (see chart for details).  Clinical Course    75 year old female who presents today with report of left shoulder pain. On my exam, it became clear that the area she is referring to is actually her left upper back. Due to concern for dissection CT obtained which revealed no signs of aortic dissection or other intrathoracic abnormalities. EKG is normal sinus rhythm with no evidence of ischemia abnormal troponins. Patient remained hemodynamically stable here in emergency department and is advised regarding return precautions and need for follow-up and voices understanding. Final Clinical Impressions(s) / ED Diagnoses   Final diagnoses:  Upper back pain on left side    New Prescriptions New Prescriptions   No medications on file     Margarita Grizzle, MD 06/28/16 [phone]

## 2016-06-25 NOTE — ED Notes (Signed)
Pt has hives on chest, denies CP, MD made aware.

## 2016-06-25 NOTE — ED Notes (Addendum)
Attempted IV x1.

## 2016-06-25 NOTE — Plan of Care (Cosign Needed)
Angiocath insertion  Performed by: Maretta Bees  Consent: Verbal consent obtained.  Risks and benefits: risks, benefits and alternatives were discussed  Preparation: Patient was prepped in the usual sterile fashion.  Vein Location: L forearm  Ultrasound Guided  Gauge: 20g  Normal blood return and flush without difficulty  Patient tolerance: Patient tolerated the procedure well with no immediate complications.

## 2016-07-09 ENCOUNTER — Ambulatory Visit: Payer: Medicare HMO | Attending: Orthopaedic Surgery | Admitting: Physical Therapy

## 2016-07-09 DIAGNOSIS — M62838 Other muscle spasm: Secondary | ICD-10-CM | POA: Diagnosis present

## 2016-07-09 DIAGNOSIS — M25512 Pain in left shoulder: Secondary | ICD-10-CM | POA: Diagnosis not present

## 2016-07-09 NOTE — Patient Instructions (Signed)
Levator Stretch   Grasp seat or sit on hand on side to be stretched. Turn head toward other side and look down. Use hand on head to gently stretch neck in that position. Hold __30__ seconds. Repeat on other side. Repeat __2-3  times. Do __2__ sessions per day.  http://gt2.exer.us/30   Copyright  VHI. All rights reserved.  Side-Bending   One hand on opposite side of head, pull head to side as far as is comfortable. Stop if there is pain. Hold __30__ seconds. Repeat with other hand to other side. Repeat ___3_ times. Do __2__ sessions per day.   Copyright  VHI. All rights reserved.  Scapular Retraction (Standing)   With arms at sides, pinch shoulder blades together. Repeat __10-20__ times per set. Do ___2_ sets per session. Do _2__ sessions per day.  http://orth.exer.us/944

## 2016-07-09 NOTE — Therapy (Signed)
[facility] [address] [place], [place], [address] Phone: [phone]   Fax:  [phone]  Physical Therapy Evaluation  Patient Details  Name: Jennifer MYRICK MRN: [id number] Date of Birth: 02/09/1941 Referring Provider: Dr. Doneen Poisson  Encounter Date: 07/09/2016      PT End of Session - 07/09/16 1500    Visit Number 1   Number of Visits 16   Date for PT Re-Evaluation 09/03/16   PT Start Time 1420   PT Stop Time 1505   PT Time Calculation (min) 45 min   Activity Tolerance Patient tolerated treatment well  pain increased    Behavior During Therapy [facility] for tasks assessed/performed      Past Medical History:  Diagnosis Date  . History of small bowel obstruction    x3  . History of TIA (transient ischemic attack) 2003  . Hyperlipemia   . Seasonal allergies   . Wears glasses     Past Surgical History:  Procedure Laterality Date  . CATARACT EXTRACTION     both eyes  . CHOLECYSTECTOMY N/A 03/22/2015   Procedure: LAPAROSCOPIC CHOLECYSTECTOMY ;  Surgeon: Abigail Miyamoto, MD;  Location: [facility];  Service: General;  Laterality: N/A;  . COLONOSCOPY    . FEMORAL HERNIA REPAIR  2000   small bowel resection-abstruction  . TOTAL ABDOMINAL HYSTERECTOMY      There were no vitals filed for this visit.       Subjective Assessment - 07/09/16 1428    Subjective Patient with gradual onset of L sided neck, scapular pain, end of July.  She went to ED.  She was on prednisone last dose today.  She has difficulty with use of LE, light housekeeping,.  She has some mild weakness, has mild vague numbness and tingling in L arm.     Limitations Sitting;Lifting;House hold activities;Other (comment)  sleep improvement    Diagnostic tests Calhoun for spine    Patient Stated Goals Pt would like to be able to exercise more and have less pain.     Currently in Pain? Yes   Pain Score 7   can be 0/10 at times today  is a bad day    Pain Location Scapula   Pain Orientation Left   Pain Descriptors / Indicators Aching;Discomfort   Pain Type Acute pain   Pain Onset 1 to 4 weeks ago   Pain Frequency Intermittent   Aggravating Factors  using her arm, overactivity   Pain Relieving Factors biofreeze, meds, heat             [facility] PT Assessment - 07/09/16 1500      Assessment   Medical Diagnosis Trapezius strain    Referring Provider Dr. Doneen Poisson   Onset Date/Surgical Date 06/23/16   Hand Dominance Right   Next MD Visit unknown    Prior Therapy no      Precautions   Precautions Calhoun     Restrictions   Weight Bearing Restrictions No     Balance Screen   Has the patient fallen in the past 6 months No     Home Environment   Living Environment Private residence     Observation/Other Assessments   Focus on Therapeutic Outcomes (FOTO)  61%     Sensation   Light Touch Impaired by gross assessment   Additional Comments mild numbness ant L forearm     Posture/Postural Control   Posture Comments Lt shoulder IR and elevated  AROM   Left Shoulder Flexion 150 Degrees   Left Shoulder ABduction 100 Degrees  pain    Cervical - Right Side Bend 34   Cervical - Left Side Bend 20   Cervical - Right Rotation 55   Cervical - Left Rotation 50     Strength   Left Shoulder Flexion 4/5   Left Shoulder ABduction 4/5   Left Shoulder Internal Rotation 4-/5   Left Shoulder External Rotation 3+/5     Palpation   Palpation comment pain med scapular border and down into thoracic paraspinals, post cuff, teres minor and ant shoulder discomfort as well      Empty Can test   Findings Positive   Side Left            [facility] Adult PT Treatment/Exercise - 07/09/16 1500      Modalities   Modalities Cryotherapy     Cryotherapy   Number Minutes Cryotherapy 10 Minutes   Cryotherapy Location Shoulder   Type of Cryotherapy Ice pack                PT Education - 07/09/16 1506     Education provided Yes   Education Details PT, Eval findings, POC and HEP    Person(s) Educated Patient   Methods Explanation;Demonstration;Handout   Comprehension Returned demonstration;Need further instruction;Verbalized understanding          PT Short Term Goals - 07/09/16 1513      PT SHORT TERM GOAL #1   Title Pt will be I with initial HEP for posture    Time 4   Period Weeks   Status New     PT SHORT TERM GOAL #2   Title Pt will notice less referral of symptoms into L UE with functional activities.    Time 4   Period Weeks   Status New           PT Long Term Goals - 07/09/16 1517      PT LONG TERM GOAL #1   Title Pt will score less than 40% limited on FOTO to demo functional improvement.    Time 8   Period Weeks   Status New     PT LONG TERM GOAL #2   Title Pt will able to perform normal work and home tasks without limitation of pain in LT UE   Time 8   Period Weeks   Status New     PT LONG TERM GOAL #3   Title Pt will be able to use bands for UE strengthening for HEP without increase in pain.   Time 8   Status New     PT LONG TERM GOAL #4   Title Pt will be able to understand and demo proper posture and modify home, work environment if needed to faciliate good alignment.    Time 8   Period Weeks   Status New               Plan - 07/09/16 1509    Clinical Impression Statement Patient with low complexity eval of L sided shoulder, scapular pain for about 2-3 weeks.  She has improved but now that her prednisone has run out, she is noticing a return of the pain.  Symptoms interfere with all aspect of UE mobility but symptoms are intermitttent.  Signs and symptoms are consistent with impingement with a more widespread referral pattern.  Pain radiates into L UE at times.  Pain increased after examination but was improved  with cold pack.     Rehab Potential Excellent   PT Frequency 2x / week   PT Duration 8 weeks   PT Treatment/Interventions  ADLs/Self Care Home Management;Moist Heat;Therapeutic activities;Dry needling;Taping;Therapeutic exercise;Balance training;Ultrasound;Neuromuscular re-education;Passive range of motion;Electrical Stimulation;Cryotherapy;Patient/family education;Iontophoresis 4mg /ml Dexamethasone;Manual techniques   PT Next Visit Plan check HEP, manual to L Upper trap, rhomboid and post cuff, reinforce posture cold pack   PT Home Exercise Plan neck tension stretches   Consulted and Agree with Plan of Care Patient      Patient will benefit from skilled therapeutic intervention in order to improve the following deficits and impairments:  Increased fascial restricitons, Impaired UE functional use, Pain, Decreased mobility, Decreased strength, Impaired sensation, Postural dysfunction, Impaired flexibility  Visit Diagnosis: Pain in left shoulder  Other muscle spasm      G-Codes - 07/09/16 1521    Functional Assessment Tool Used FOTO   Functional Limitation Carrying, moving and handling objects   Carrying, Moving and Handling Objects Current Status [phone](G8984) At least 60 percent but less than 80 percent impaired, limited or restricted   Carrying, Moving and Handling Objects Goal Status ([id number](G8985) At least 20 percent but less than 40 percent impaired, limited or restricted       Problem List There are no active problems to display for this patient.   Michille Mcelrath 07/09/2016, 3:22 PM  [facility] [address] [place], [place], [address] Phone: [phone]   Fax:  [phone]  Name: Donne HazelKathleen S Brandvold MRN: [id number] Date of Birth: 02/14/1941  Karie MainlandJennifer Cathey Fredenburg, PT 07/09/16 3:23 PM Phone: [phone] Fax: [phone]

## 2016-07-15 ENCOUNTER — Encounter: Payer: Self-pay | Admitting: Physical Therapy

## 2016-07-15 ENCOUNTER — Ambulatory Visit: Payer: Medicare HMO | Admitting: Physical Therapy

## 2016-07-15 DIAGNOSIS — M62838 Other muscle spasm: Secondary | ICD-10-CM

## 2016-07-15 DIAGNOSIS — M25512 Pain in left shoulder: Secondary | ICD-10-CM

## 2016-07-15 NOTE — Therapy (Signed)
[facility] [address] [place], [place], [address] Phone: [phone]   Fax:  [phone]  Physical Therapy Treatment  Patient Details  Name: Jennifer Calhoun MRN: [id number] Date of Birth: 07/04/1941 Referring Provider: Dr. Doneen PoissonBlackman, Christopher  Encounter Date: 07/15/2016      PT End of Session - 07/15/16 1251    Visit Number 2   Number of Visits 16   Date for PT Re-Evaluation 09/03/16   PT Start Time 1015   PT Stop Time 1100   PT Time Calculation (min) 45 min   Activity Tolerance Patient tolerated treatment well   Behavior [facility] Therapy [facility] for tasks assessed/performed      Past Medical History:  Diagnosis Date  . History of small bowel obstruction    x3  . History of TIA (transient ischemic attack) 2003  . Hyperlipemia   . Seasonal allergies   . Wears glasses     Past Surgical History:  Procedure Laterality Date  . CATARACT EXTRACTION     both eyes  . CHOLECYSTECTOMY N/A 03/22/2015   Procedure: LAPAROSCOPIC CHOLECYSTECTOMY ;  Surgeon: Abigail Miyamotoouglas Blackman, MD;  Location: [facility];  Service: General;  Laterality: N/A;  . COLONOSCOPY    . FEMORAL HERNIA REPAIR  2000   small bowel resection-abstruction  . TOTAL ABDOMINAL HYSTERECTOMY      There were no vitals filed for this visit.      Subjective Assessment - 07/15/16 1022    Subjective Pt arriving to therapy with questions about her HEP. Pt reports she has run out of her muscle relaxors and is contacting her PCP. Pt reporting 8/10 pain on L shoulder.    Limitations Sitting;Lifting;House hold activities;Other (comment)   Diagnostic tests none for spine    Patient Stated Goals Pt would like to be able to exercise more and have less pain.     Currently in Pain? Yes   Pain Score 8    Pain Location Shoulder   Pain Orientation Left   Pain Type Acute pain   Pain Onset 1 to 4 weeks ago   Pain Frequency Intermittent   Aggravating Factors   reaching, UE activities, household chores   Pain Relieving Factors biofreeze, meds, heat   Multiple Pain Sites No            [facility] PT Assessment - 07/15/16 0001      AROM   Left Shoulder Flexion 150 Degrees   Left Shoulder ABduction 100 Degrees   Cervical - Right Rotation 55   Cervical - Left Rotation 50     Palpation   Palpation comment pain med scapular border and down into thoracic paraspinals, teres minor and ant shoulder.                     [facility] Adult PT Treatment/Exercise - 07/15/16 0001      Posture/Postural Control   Posture Comments Lt shoulder IR and elevated     Exercises   Exercises Neck;Shoulder     Neck Exercises: Theraband   Scapula Retraction 10 reps   Rows 10 reps;Red     Neck Exercises: Seated   Neck Retraction --   Shoulder Rolls 10 reps  concentrating on shoulder depression     Neck Exercises: Supine   Cervical Isometrics Extension;5 secs   Neck Retraction 5 reps;5 secs   Other Supine Exercise Supine, towel roll under spine, shoulders and elbows at 90 degrees (open book)  Modalities   Modalities Cryotherapy     Cryotherapy   Number Minutes Cryotherapy 10 Minutes   Cryotherapy Location Shoulder   Type of Cryotherapy Ice pack     Manual Therapy   Manual Therapy Joint mobilization;Soft tissue mobilization   Joint Mobilization scapular mobilization: ant/sup, med/lat.    Soft tissue mobilization Soft tissue mobilization to upper trap, posterior rotator cuff, occipital region, paraspinals     Neck Exercises: Stretches   Upper Trapezius Stretch 3 reps;30 seconds   Levator Stretch 3 reps;30 seconds                PT Education - 07/15/16 1249    Education provided Yes   Education Details Reviewed all HEP and Dry Needling   Person(s) Educated Patient   Methods Explanation;Demonstration;Verbal cues   Comprehension Verbalized understanding;Returned demonstration;Verbal cues required          PT Short Term Goals  - 07/09/16 1513      PT SHORT TERM GOAL #1   Title Pt will be I with initial HEP for posture    Time 4   Period Weeks   Status New     PT SHORT TERM GOAL #2   Title Pt will notice less referral of symptoms into L UE with functional activities.    Time 4   Period Weeks   Status New           PT Long Term Goals - 07/09/16 1517      PT LONG TERM GOAL #1   Title Pt will score less than 40% limited on FOTO to demo functional improvement.    Time 8   Period Weeks   Status New     PT LONG TERM GOAL #2   Title Pt will able to perform normal work and home tasks without limitation of pain in LT UE   Time 8   Period Weeks   Status New     PT LONG TERM GOAL #3   Title Pt will be able to use bands for UE strengthening for HEP without increase in pain.   Time 8   Status New     PT LONG TERM GOAL #4   Title Pt will be able to understand and demo proper posture and modify home, work environment if needed to faciliate good alignment.    Time 8   Period Weeks   Status New               Plan - 07/15/16 1252    Clinical Impression Statement Pt arriving to therapy with L sided shoulder pain of 8/10 at beginning of session. Performed manual therapy techniques and stretching of shoulder and upper trap. At end of session pt reporting pain decreasd to 3/10. Continue to progress stretching, ROM, strengthening. Possibly Dry Needling at next visit.    Rehab Potential Excellent   PT Frequency 2x / week   PT Duration 8 weeks   PT Treatment/Interventions ADLs/Self Care Home Management;Moist Heat;Therapeutic activities;Dry needling;Taping;Therapeutic exercise;Balance training;Ultrasound;Neuromuscular re-education;Passive range of motion;Electrical Stimulation;Cryotherapy;Patient/family education;Iontophoresis 4mg /ml Dexamethasone;Manual techniques   PT Next Visit Plan check HEP, manual to L Upper trap, rhomboid and post cuff, reinforce posture cold pack, discuss Dry Needling   PT Home  Exercise Plan neck tension stretches   Consulted and Agree with Plan of Care Patient      Patient will benefit from skilled therapeutic intervention in order to improve the following deficits and impairments:  Increased fascial restricitons, Impaired UE functional use,  Pain, Decreased mobility, Decreased strength, Impaired sensation, Postural dysfunction, Impaired flexibility  Visit Diagnosis: No diagnosis found.     Problem List There are no active problems to display for this patient.   Sharmon Leyden 07/15/2016, 12:58 PM  [facility] [address] [place], [place], [address] Phone: [phone]   Fax:  [phone]  Name: Jennifer Calhoun MRN: [id number] Date of Birth: 04/22/1941  Narda Amber, PT 07/15/16 12:59 PM

## 2016-07-18 ENCOUNTER — Ambulatory Visit: Payer: Medicare HMO | Admitting: Physical Therapy

## 2016-07-18 DIAGNOSIS — M25512 Pain in left shoulder: Secondary | ICD-10-CM | POA: Diagnosis not present

## 2016-07-18 DIAGNOSIS — M62838 Other muscle spasm: Secondary | ICD-10-CM

## 2016-07-18 NOTE — Therapy (Signed)
[facility] [address] [place], [place], [address] Phone: [phone]   Fax:  [phone]  Physical Therapy Treatment  Patient Details  Name: Jennifer Calhoun MRN: [id number] Date of Birth: 01/10/1941 Referring Provider: Dr. Doneen PoissonBlackman, Christopher  Encounter Date: 07/18/2016      PT End of Session - 07/18/16 0944    Visit Number 3   Number of Visits 16   Date for PT Re-Evaluation 09/03/16   PT Start Time 0855  pt arrived 10 min late today   PT Stop Time 0948   PT Time Calculation (min) 53 min   Activity Tolerance Patient tolerated treatment well   Behavior During Therapy [facility] for tasks assessed/performed      Past Medical History:  Diagnosis Date  . History of small bowel obstruction    x3  . History of TIA (transient ischemic attack) 2003  . Hyperlipemia   . Seasonal allergies   . Wears glasses     Past Surgical History:  Procedure Laterality Date  . CATARACT EXTRACTION     both eyes  . CHOLECYSTECTOMY N/A 03/22/2015   Procedure: LAPAROSCOPIC CHOLECYSTECTOMY ;  Surgeon: Abigail Miyamotoouglas Blackman, MD;  Location: [facility];  Service: General;  Laterality: N/A;  . COLONOSCOPY    . FEMORAL HERNIA REPAIR  2000   small bowel resection-abstruction  . TOTAL ABDOMINAL HYSTERECTOMY      There were no vitals filed for this visit.      Subjective Assessment - 07/18/16 0853    Subjective "doing better, still having soreness mostly in the L especially after waking up"    Currently in Pain? Yes   Pain Score 4   took naproxen this AM   Pain Location Shoulder   Pain Orientation Left   Pain Descriptors / Indicators Aching;Discomfort  This AM it was sharp    Pain Type Acute pain   Pain Onset 1 to 4 weeks ago   Pain Frequency Intermittent                         [facility] Adult PT Treatment/Exercise - 07/18/16 0001      Modalities   Modalities Moist Heat     Moist Heat Therapy   Number Minutes  Moist Heat 10 Minutes   Moist Heat Location Shoulder;Cervical  pt reported no hx of cancer     Manual Therapy   Manual Therapy Myofascial release   Joint Mobilization scapular mobilization: ant/sup, med/lat.    Soft tissue mobilization IASTM along L upper trap, Rhombiods and distal teres minor/ major and posterior deltoid   Myofascial Release fascial rolling/ and gentle stretching over the L upper trap, rhomboid region     Neck Exercises: Stretches   Upper Trapezius Stretch 2 reps;30 seconds          Trigger Point Dry Needling - 07/18/16 0858    Consent Given? Yes   Education Handout Provided Yes   Muscles Treated Upper Body Upper trapezius;Rhomboids  L teres major/ minor x 3   Upper Trapezius Response Twitch reponse elicited;Palpable increased muscle length  L upper trap x 3 with pistoning/ twisting technique   Rhomboids Response Twitch response elicited;Palpable increased muscle length  x 1               PT Education - 07/18/16 0942    Education provided Yes   Education Details anatomy of muscles in regard to formation of trigger points. Benefits of dry  needling, what to expect and after care. updated HEP for Rows.    Person(s) Educated Patient   Methods Explanation;Verbal cues;Handout   Comprehension Verbalized understanding;Verbal cues required          PT Short Term Goals - 07/09/16 1513      PT SHORT TERM GOAL #1   Title Pt will be I with initial HEP for posture    Time 4   Period Weeks   Status New     PT SHORT TERM GOAL #2   Title Pt will notice less referral of symptoms into L UE with functional activities.    Time 4   Period Weeks   Status New           PT Long Term Goals - 07/09/16 1517      PT LONG TERM GOAL #1   Title Pt will score less than 40% limited on FOTO to demo functional improvement.    Time 8   Period Weeks   Status New     PT LONG TERM GOAL #2   Title Pt will able to perform normal work and home tasks without limitation  of pain in LT UE   Time 8   Period Weeks   Status New     PT LONG TERM GOAL #3   Title Pt will be able to use bands for UE strengthening for HEP without increase in pain.   Time 8   Status New     PT LONG TERM GOAL #4   Title Pt will be able to understand and demo proper posture and modify home, work environment if needed to faciliate good alignment.    Time 8   Period Weeks   Status New               Plan - 07/18/16 0945    Clinical Impression Statement pt arrived 10 min late today. DN was performed on L upper trap, Rhomboid, and Teres major/minor. Following DN and soft tissue work pain reported no referral of pain in to the L hand with only soreness in the upper trap. MHP to calm down soreness post session.    PT Next Visit Plan manual to L Upper trap, rhomboid and post cuff, reinforce posture cold pack,    PT Home Exercise Plan Rows   Consulted and Agree with Plan of Care Patient      Patient will benefit from skilled therapeutic intervention in order to improve the following deficits and impairments:  Increased fascial restricitons, Impaired UE functional use, Pain, Decreased mobility, Decreased strength, Impaired sensation, Postural dysfunction, Impaired flexibility  Visit Diagnosis: Pain in left shoulder  Other muscle spasm     Problem List There are no active problems to display for this patient.  Lulu Riding PT, DPT, LAT, ATC  07/18/16  9:57 AM      [facility] [address] [place], [place], [address] Phone: [phone]   Fax:  [phone]  Name: Jennifer Calhoun MRN: [id number] Date of Birth: 04-Feb-1941

## 2016-07-21 ENCOUNTER — Encounter: Payer: Self-pay | Admitting: Physical Therapy

## 2016-07-21 ENCOUNTER — Ambulatory Visit: Payer: Medicare HMO | Admitting: Physical Therapy

## 2016-07-21 DIAGNOSIS — M62838 Other muscle spasm: Secondary | ICD-10-CM

## 2016-07-21 DIAGNOSIS — M25512 Pain in left shoulder: Secondary | ICD-10-CM | POA: Diagnosis not present

## 2016-07-21 NOTE — Therapy (Signed)
[facility] [address] [place], [place], [address] Phone: [phone]   Fax:  [phone]  Physical Therapy Treatment  Patient Details  Name: Jennifer Calhoun MRN: [id number] Date of Birth: July 29, 1941 Referring Provider: Dr. Doneen Poisson  Encounter Date: 07/21/2016      PT End of Session - 07/21/16 1425    Visit Number 4   Number of Visits 16   Date for PT Re-Evaluation 09/03/16   PT Start Time 1340   PT Stop Time 1415   PT Time Calculation (min) 35 min   Activity Tolerance Patient tolerated treatment well   Behavior During Therapy [facility] for tasks assessed/performed      Past Medical History:  Diagnosis Date  . History of small bowel obstruction    x3  . History of TIA (transient ischemic attack) 2003  . Hyperlipemia   . Seasonal allergies   . Wears glasses     Past Surgical History:  Procedure Laterality Date  . CATARACT EXTRACTION     both eyes  . CHOLECYSTECTOMY N/A 03/22/2015   Procedure: LAPAROSCOPIC CHOLECYSTECTOMY ;  Surgeon: Abigail Miyamoto, MD;  Location: [facility];  Service: General;  Laterality: N/A;  . COLONOSCOPY    . FEMORAL HERNIA REPAIR  2000   small bowel resection-abstruction  . TOTAL ABDOMINAL HYSTERECTOMY      There were no vitals filed for this visit.      Subjective Assessment - 07/21/16 1340    Subjective Pt reporting doing better after the dry needling which was performed last Friday afternoon, but pt reported that the pain increased beginning on Saturday night to 10/10. Pt still having difficutly sleeping and working makes it worse.    Limitations Sitting;Lifting;House hold activities;Other (comment)   Diagnostic tests none for spine    Patient Stated Goals Pt would like to be able to exercise more and have less pain.     Currently in Pain? Yes   Pain Score 4    Pain Location Shoulder   Pain Orientation Left   Pain Descriptors / Indicators Aching   Pain  Type Acute pain   Pain Onset 1 to 4 weeks ago   Pain Frequency Constant   Aggravating Factors  working, reaching, using her L UE, household chores   Pain Relieving Factors biofreeze, meds, heat   Multiple Pain Sites No            [facility] PT Assessment - 07/21/16 0001      AROM   Cervical - Right Side Bend 30   Cervical - Left Side Bend 15   Cervical - Right Rotation 55   Cervical - Left Rotation 45                     [facility] Adult PT Treatment/Exercise - 07/21/16 0001      Exercises   Exercises Neck;Shoulder     Neck Exercises: Theraband   Scapula Retraction 10 reps   Scapula Retraction Limitations Pt reporting increased pain   Rows 10 reps;Red  2 sets   Rows Limitations pt reporting increased pain      Manual Therapy   Manual Therapy Myofascial release   Joint Mobilization scapular mobilization: ant/sup, med/lat.    Soft tissue mobilization L upper trap, Rhombiods and distal teres minor/ major and posterior deltoid     Neck Exercises: Stretches   Upper Trapezius Stretch 2 reps;30 seconds   Levator Stretch 3 reps;30 seconds  PT Education - 07/21/16 1432    Education provided Yes   Education Details Instructed pt in median nerve neuro-tension stretch   Person(s) Educated Patient   Methods Explanation;Demonstration;Verbal cues   Comprehension Verbalized understanding;Returned demonstration;Verbal cues required;Tactile cues required          PT Short Term Goals - 07/21/16 1432      PT SHORT TERM GOAL #1   Title Pt will be I with initial HEP for posture    Time 4   Period Weeks   Status On-going     PT SHORT TERM GOAL #2   Title Pt will notice less referral of symptoms into L UE with functional activities.    Time 4   Period Weeks   Status New           PT Long Term Goals - 07/09/16 1517      PT LONG TERM GOAL #1   Title Pt will score less than 40% limited on FOTO to demo functional improvement.    Time 8    Period Weeks   Status New     PT LONG TERM GOAL #2   Title Pt will able to perform normal work and home tasks without limitation of pain in LT UE   Time 8   Period Weeks   Status New     PT LONG TERM GOAL #3   Title Pt will be able to use bands for UE strengthening for HEP without increase in pain.   Time 8   Status New     PT LONG TERM GOAL #4   Title Pt will be able to understand and demo proper posture and modify home, work environment if needed to faciliate good alignment.    Time 8   Period Weeks   Status New               Plan - 07/21/16 1426    Clinical Impression Statement Pt arriving a few minutes late today. Pt reporting Dry Needling helped for a little over 24 hours but her pain returned the following night. Pt reporting 10/10 pain today. HEP reviewed and pt reporting increased pain with all her exercises .Scapula Mobilizations performed and soft tissue and trigger point release performed to L shoulder (Upper Trap, Rhomboid, Teres Major/Minor, and Occipitals). Pt reporting pain decreased to 3/10 at end of session.    Rehab Potential Excellent   PT Frequency 2x / week   PT Duration 8 weeks   PT Next Visit Plan manual to L Upper trap, rhomboid and post cuff, reinforce posture cold pack, discussed inhibition taping of the L upper trap with Gaylyn RongKris for next visit.    PT Home Exercise Plan Instructed in Median Nerve Neuro Tension stretching   Consulted and Agree with Plan of Care Patient      Patient will benefit from skilled therapeutic intervention in order to improve the following deficits and impairments:  Increased fascial restricitons, Impaired UE functional use, Pain, Decreased mobility, Decreased strength, Impaired sensation, Postural dysfunction, Impaired flexibility  Visit Diagnosis: Pain in left shoulder  Other muscle spasm     Problem List There are no active problems to display for this patient.   Jennifer Calhoun 07/21/2016, 2:36 PM  [facility] [address] [place], [place], [address] Phone: [phone]   Fax:  [phone]  Name: Jennifer Calhoun MRN: [id number] Date of Birth: 12/23/1940   Narda AmberJennifer Calhoun, PT 07/21/16 2:36 PM

## 2016-07-22 ENCOUNTER — Encounter: Payer: Medicare Other | Admitting: Physical Therapy

## 2016-07-25 ENCOUNTER — Ambulatory Visit: Payer: Medicare HMO | Attending: Orthopaedic Surgery | Admitting: Physical Therapy

## 2016-07-25 DIAGNOSIS — M25512 Pain in left shoulder: Secondary | ICD-10-CM

## 2016-07-25 DIAGNOSIS — M62838 Other muscle spasm: Secondary | ICD-10-CM

## 2016-07-25 NOTE — Therapy (Addendum)
[facility] [place], [place], [address] Phone: [phone]   Fax:  [phone]  Physical Therapy Treatment / Discharge note  Patient Details  Name: Jennifer Calhoun MRN: [id number] Date of Birth: June 21, 1941 Referring Provider: Dr. Jean Rosenthal  Encounter Date: 07/25/2016      PT End of Session - 07/25/16 0936    Visit Number 5   Number of Visits 16   Date for PT Re-Evaluation 09/03/16   PT Start Time 0934   PT Stop Time 1019   PT Time Calculation (min) 45 min   Activity Tolerance Patient tolerated treatment well   Behavior During Therapy [facility] for tasks assessed/performed      Past Medical History:  Diagnosis Date  . History of small bowel obstruction    x3  . History of TIA (transient ischemic attack) 2003  . Hyperlipemia   . Seasonal allergies   . Wears glasses     Past Surgical History:  Procedure Laterality Date  . CATARACT EXTRACTION     both eyes  . CHOLECYSTECTOMY N/A 03/22/2015   Procedure: LAPAROSCOPIC CHOLECYSTECTOMY ;  Surgeon: Coralie Keens, MD;  Location: [place];  Service: General;  Laterality: N/A;  . COLONOSCOPY    . FEMORAL HERNIA REPAIR  2000   small bowel resection-abstruction  . TOTAL ABDOMINAL HYSTERECTOMY      There were no vitals filed for this visit.      Subjective Assessment - 07/25/16 0937    Subjective "nothings been working, so I went a Art therapist and he said it was my ribs" since he adjusted me I haven't had any pain.    Currently in Pain? Yes   Pain Score 1    Pain Location Shoulder   Pain Orientation Left   Pain Descriptors / Indicators Aching   Pain Onset 1 to 4 weeks ago                         [facility] Adult PT Treatment/Exercise - 07/25/16 0001      Moist Heat Therapy   Number Minutes Moist Heat 5 Minutes   Moist Heat Location Shoulder;Cervical  with pt in sitting     Manual Therapy   Manual Therapy Taping    Joint Mobilization scapular mobilization: ant/sup, med/lat.    Soft tissue mobilization L upper trap, Rhombiods and distal teres minor/ major and posterior deltoid   Myofascial Release fascial rolling/ and gentle stretching over the L upper trap, rhomboid region     Neck Exercises: Stretches   Upper Trapezius Stretch 2 reps;30 seconds   Levator Stretch 3 reps;30 seconds          Trigger Point Dry Needling - 07/25/16 0945    Consent Given? Yes   Education Handout Provided Yes  given previously   Upper Trapezius Response Twitch reponse elicited;Palpable increased muscle length  x 3 with pistoning / twisting technique              PT Education - 07/25/16 1018    Education provided Yes   Education Details benefits of improving thoracic mobility to assist with better sitting posture and decrease tightness in the pain L upper trap.    Person(s) Educated Patient   Methods Explanation;Verbal cues   Comprehension Verbalized understanding;Verbal cues required          PT Short Term Goals - 07/21/16 1432      PT SHORT TERM GOAL #1  Title Pt will be I with initial HEP for posture    Time 4   Period Weeks   Status On-going     PT SHORT TERM GOAL #2   Title Pt will notice less referral of symptoms into L UE with functional activities.    Time 4   Period Weeks   Status New           PT Long Term Goals - 07/09/16 1517      PT LONG TERM GOAL #1   Title Pt will score less than 40% limited on FOTO to demo functional improvement.    Time 8   Period Weeks   Status New     PT LONG TERM GOAL #2   Title Pt will able to perform normal work and home tasks without limitation of pain in LT UE   Time 8   Period Weeks   Status New     PT LONG TERM GOAL #3   Title Pt will be able to use bands for UE strengthening for HEP without increase in pain.   Time 8   Status New     PT LONG TERM GOAL #4   Title Pt will be able to understand and demo proper posture and modify  home, work environment if needed to faciliate good alignment.    Time 8   Period Weeks   Status New               Plan - 07/25/16 1049    Clinical Impression Statement pt reports going to her chiropractor and was manipulated with a due to rib immobility which she reported relief of pain. focused today on DN of the L upper trap followed up with soft tissue work to calm down tightness, and inhibition taping to prevent upper trap activation which she rpeorted significant relief of pain.  plan to see pt for a couple visits if pain remains low.    PT Next Visit Plan assess response to inhibition taping, manual to L Upper trap, rhomboid and post cuff, reinforce posture cold pack, thoracic mobility,    Consulted and Agree with Plan of Care Patient      Patient will benefit from skilled therapeutic intervention in order to improve the following deficits and impairments:  Increased fascial restricitons, Impaired UE functional use, Pain, Decreased mobility, Decreased strength, Impaired sensation, Postural dysfunction, Impaired flexibility  Visit Diagnosis: Pain in left shoulder  Other muscle spasm     Problem List There are no active problems to display for this patient.  Starr Lake PT, DPT, LAT, ATC  07/25/16  10:54 AM      [facility] [address] [place], [place], [address] Phone: [phone]   Fax:  [phone]  Name: Jennifer Calhoun MRN: [id number] Date of Birth: [birth year]   PHYSICAL THERAPY DISCHARGE SUMMARY  Visits from Start of Care: 5  Current functional level related to goals / functional outcomes: See goals   Remaining deficits: Tightness in the L upper trap with intermittent pain.    Education / Equipment: HEP, posture education, Dry needling, theraband for strengthening.  Plan: Patient agrees to discharge.  Patient goals were partially met. Patient is being discharged due to being pleased  with the current functional level.  ?????    Kristoffer Leamon PT, DPT, LAT, ATC  07/29/16  10:04 AM

## 2016-07-29 ENCOUNTER — Ambulatory Visit: Payer: Medicare HMO | Admitting: Physical Therapy

## 2016-08-21 ENCOUNTER — Encounter (INDEPENDENT_AMBULATORY_CARE_PROVIDER_SITE_OTHER): Payer: Medicare HMO | Admitting: Ophthalmology

## 2016-08-21 DIAGNOSIS — H43813 Vitreous degeneration, bilateral: Secondary | ICD-10-CM | POA: Diagnosis not present

## 2016-08-21 DIAGNOSIS — H353131 Nonexudative age-related macular degeneration, bilateral, early dry stage: Secondary | ICD-10-CM

## 2016-08-21 DIAGNOSIS — H35051 Retinal neovascularization, unspecified, right eye: Secondary | ICD-10-CM

## 2017-01-02 ENCOUNTER — Ambulatory Visit (INDEPENDENT_AMBULATORY_CARE_PROVIDER_SITE_OTHER): Payer: Medicare HMO | Admitting: Ophthalmology

## 2017-03-25 ENCOUNTER — Other Ambulatory Visit: Payer: Self-pay | Admitting: *Deleted

## 2017-03-25 DIAGNOSIS — M8580 Other specified disorders of bone density and structure, unspecified site: Secondary | ICD-10-CM

## 2017-06-24 ENCOUNTER — Ambulatory Visit
Admission: RE | Admit: 2017-06-24 | Discharge: 2017-06-24 | Disposition: A | Discharge status: Home / Self Care | Payer: Medicare HMO | Source: Ambulatory Visit | Attending: *Deleted | Admitting: *Deleted

## 2017-06-24 DIAGNOSIS — M8580 Other specified disorders of bone density and structure, unspecified site: Secondary | ICD-10-CM

## 2018-12-29 ENCOUNTER — Other Ambulatory Visit: Payer: Self-pay | Admitting: Cardiology

## 2018-12-29 DIAGNOSIS — I872 Venous insufficiency (chronic) (peripheral): Secondary | ICD-10-CM

## 2020-06-07 ENCOUNTER — Other Ambulatory Visit: Payer: Self-pay

## 2020-06-07 DIAGNOSIS — M7989 Other specified soft tissue disorders: Secondary | ICD-10-CM

## 2020-06-14 ENCOUNTER — Ambulatory Visit (INDEPENDENT_AMBULATORY_CARE_PROVIDER_SITE_OTHER): Payer: Medicare HMO | Admitting: Vascular Surgery

## 2020-06-14 ENCOUNTER — Ambulatory Visit (HOSPITAL_COMMUNITY)
Admission: RE | Admit: 2020-06-14 | Discharge: 2020-06-14 | Disposition: A | Discharge status: Home / Self Care | Payer: Medicare HMO | Source: Ambulatory Visit | Attending: Vascular Surgery | Admitting: Vascular Surgery

## 2020-06-14 ENCOUNTER — Other Ambulatory Visit: Payer: Self-pay

## 2020-06-14 ENCOUNTER — Encounter: Payer: Self-pay | Admitting: Vascular Surgery

## 2020-06-14 VITALS — BP 141/79 | HR 65 | Temp 98.2°F | Resp 20 | Ht 63.0 in | Wt 178.0 lb

## 2020-06-14 DIAGNOSIS — M7989 Other specified soft tissue disorders: Secondary | ICD-10-CM

## 2020-06-14 NOTE — Progress Notes (Signed)
Patient name: CECILLIA Calhoun MRN: [id number] DOB: 1941-09-29 Sex: female   HPI: Jennifer Calhoun is a 79 y.o. female, with chronic leg swelling.  She states the symptoms have worsened over the last year.  Last several months have been worse.  She was put on Lasix with minimal improvement.  The legs become more swollen as the day progresses.  She has no history of DVT.  She has developed some spider type varicosities on the surface.  She really has no family history of varicose veins.   Past Medical History:  Diagnosis Date  . History of small bowel obstruction    x3  . History of TIA (transient ischemic attack) 2003  . Hyperlipemia   . Seasonal allergies   . Wears glasses    Past Surgical History:  Procedure Laterality Date  . CATARACT EXTRACTION     both eyes  . CHOLECYSTECTOMY N/A 03/22/2015   Procedure: LAPAROSCOPIC CHOLECYSTECTOMY ;  Surgeon: Abigail Miyamoto, MD;  Location: [facility];  Service: General;  Laterality: N/A;  . COLONOSCOPY    . FEMORAL HERNIA REPAIR  2000   small bowel resection-abstruction  . TOTAL ABDOMINAL HYSTERECTOMY      History reviewed. No pertinent family history.  SOCIAL HISTORY: Social History   Socioeconomic History  . Marital status: Single    Spouse name: Not on file  . Number of children: Not on file  . Years of education: Not on file  . Highest education level: Not on file  Occupational History  . Not on file  Tobacco Use  . Smoking status: Former Smoker    Quit date: 03/20/1972    Years since quitting: 48.2  . Smokeless tobacco: Never Used  Vaping Use  . Vaping Use: Never used  Substance and Sexual Activity  . Alcohol use: Yes    Comment: glass wine nightly  . Drug use: No  . Sexual activity: Not on file  Other Topics Concern  . Not on file  Social History Narrative  . Not on file   Social Determinants of Health   Financial Resource Strain:   . Difficulty of Paying Living Expenses:   Food Insecurity:     . Worried About Programme researcher, broadcasting/film/video in the Last Year:   . Barista in the Last Year:   Transportation Needs:   . Freight forwarder (Medical):   Marland Kitchen Lack of Transportation (Non-Medical):   Physical Activity:   . Days of Exercise per Week:   . Minutes of Exercise per Session:   Stress:   . Feeling of Stress :   Social Connections:   . Frequency of Communication with Friends and Family:   . Frequency of Social Gatherings with Friends and Family:   . Attends Religious Services:   . Active Member of Clubs or Organizations:   . Attends Banker Meetings:   Marland Kitchen Marital Status:   Intimate Partner Violence:   . Fear of Current or Ex-Partner:   . Emotionally Abused:   Marland Kitchen Physically Abused:   . Sexually Abused:     Allergies  Allergen Reactions  . Penicillins     Has patient had a PCN reaction causing immediate rash, facial/tongue/throat swelling, SOB or lightheadedness with hypotension: NO Has patient had a PCN reaction causing severe rash involving mucus membranes or skin necrosis: NO Has patient had a PCN reaction that required hospitalizationNO Has patient had a PCN reaction occurring within the last 10 years: NO  If all of the above answers are "NO", then may proceed with Cephalosporin use.  Dorena Dew Allergy Skin Test   . Cat Hair Extract   . Gramineae Pollens   . Latex Itching  . Shellfish Allergy     Current Outpatient Medications  Medication Sig Dispense Refill  . alendronate (FOSAMAX) 10 MG tablet Take 10 mg by mouth daily.    Marland Kitchen atorvastatin (LIPITOR) 20 MG tablet Take 20 mg by mouth daily.    . cyclobenzaprine (FLEXERIL) 5 MG tablet cyclobenzaprine 5 mg tablet  Take 1 tablet(s) 3 times a day by oral route as needed for 10 days.    . furosemide (LASIX) 20 MG tablet Take 20 mg by mouth daily as needed.    Marland Kitchen ibuprofen (ADVIL,MOTRIN) 800 MG tablet Take 1 tablet (800 mg total) by mouth 3 (three) times daily. 21 tablet 0  . loratadine (CLARITIN) 10  MG tablet Take 10 mg by mouth daily.    . naphazoline-pheniramine (NAPHCON-A) 0.025-0.3 % ophthalmic solution Place 1 drop into both eyes daily.    . naproxen (NAPROSYN) 500 MG tablet Take 500 mg by mouth 2 (two) times daily as needed.    . predniSONE (DELTASONE) 10 MG tablet Take 2 tablets (20 mg total) by mouth daily. 5 tablet 0  . Probiotic Product (PROBIOTIC & ACIDOPHILUS EX ST PO) Take by mouth.     No current facility-administered medications for this visit.    ROS:   General:  No weight loss, Fever, chills  HEENT: No recent headaches, no nasal bleeding, no visual changes, no sore throat  Neurologic: No dizziness, blackouts, seizures. No recent symptoms of stroke or mini- stroke. No recent episodes of slurred speech, or temporary blindness.  Cardiac: No recent episodes of chest pain/pressure, no shortness of breath at rest.  No shortness of breath with exertion.  Denies history of atrial fibrillation or irregular heartbeat  Vascular: No history of rest pain in feet.  No history of claudication.  No history of non-healing ulcer, No history of DVT   Pulmonary: No home oxygen, no productive cough, no hemoptysis,  No asthma or wheezing  Musculoskeletal:  [ ]  Arthritis, [ ]  Low back pain,  [ ]  Joint pain  Hematologic:No history of hypercoagulable state.  No history of easy bleeding.  No history of anemia  Gastrointestinal: No hematochezia or melena,  No gastroesophageal reflux, no trouble swallowing  Urinary: [ ]  chronic Kidney disease, [ ]  on HD - [ ]  MWF or [ ]  TTHS, [ ]  Burning with urination, [ ]  Frequent urination, [ ]  Difficulty urinating;   Skin: No rashes  Psychological: No history of anxiety,  No history of depression   Physical Examination  Vitals:   06/14/20 1418  BP: (!) 141/79  Pulse: 65  Resp: 20  Temp: 98.2 F (36.8 C)  SpO2: 97%  Weight: 178 lb (80.7 kg)  Height: 5\' 3"  (1.6 m)    Body mass index is 31.53 kg/m.  General:  Alert and oriented, no  acute distress HEENT: Normal Neck: No JVD Cardiac: Regular Rate and Rhythm Abdomen: Soft, non-tender, non-distended, no mass Skin: No rash, scattered spider type varicosities medial anterior thigh bilaterally Extremity Pulses:  2+ radial, brachial, femoral, dorsalis pedis, posterior tibial pulses bilaterally Musculoskeletal: No deformity trace pretibial and ankle edema bilaterally  Neurologic: Upper and lower extremity motor 5/5 and symmetric  DATA:  Patient had a venous reflux exam which I reviewed and interpreted.  This showed absent left greater  saphenous vein there was no reflux in the right leg.  There was mild reflux in the left common femoral vein.  ASSESSMENT: Patient with chronic leg swelling no real significant venous disease to explain this.  Patient is overweight and sedentary.   PLAN: I discussed with the patient today weight loss for improvement of her leg swelling symptoms and several dietary changes she could make as well as an exercise program of walking 30 minutes daily.  I also believe she would benefit from wearing compression stockings 20 to 30 mmHg.  We also discussed possibility of sclerotherapy for her spider veins but did discuss that this would not change her leg swelling and would be cosmetic only.  She will consider whether or not she wishes to proceed with this.  Otherwise she will follow up on an as-needed basis.  Fabienne Bruns, MD [facility] Office: [phone]

## 2021-02-15 DIAGNOSIS — H16223 Keratoconjunctivitis sicca, not specified as Sjogren's, bilateral: Secondary | ICD-10-CM | POA: Diagnosis not present

## 2021-02-15 DIAGNOSIS — H04123 Dry eye syndrome of bilateral lacrimal glands: Secondary | ICD-10-CM | POA: Diagnosis not present

## 2021-03-29 DIAGNOSIS — M79672 Pain in left foot: Secondary | ICD-10-CM | POA: Diagnosis not present

## 2021-03-29 DIAGNOSIS — J209 Acute bronchitis, unspecified: Secondary | ICD-10-CM | POA: Diagnosis not present

## 2021-03-29 DIAGNOSIS — M542 Cervicalgia: Secondary | ICD-10-CM | POA: Diagnosis not present

## 2021-04-07 ENCOUNTER — Encounter (HOSPITAL_BASED_OUTPATIENT_CLINIC_OR_DEPARTMENT_OTHER): Payer: Self-pay | Admitting: Emergency Medicine

## 2021-04-07 ENCOUNTER — Emergency Department (HOSPITAL_BASED_OUTPATIENT_CLINIC_OR_DEPARTMENT_OTHER)
Admission: EM | Admit: 2021-04-07 | Discharge: 2021-04-07 | Disposition: A | Discharge status: Home / Self Care | Payer: Medicare HMO | Attending: Emergency Medicine | Admitting: Emergency Medicine

## 2021-04-07 ENCOUNTER — Other Ambulatory Visit: Payer: Self-pay

## 2021-04-07 DIAGNOSIS — Z91040 Latex allergy status: Secondary | ICD-10-CM | POA: Diagnosis not present

## 2021-04-07 DIAGNOSIS — R42 Dizziness and giddiness: Secondary | ICD-10-CM | POA: Insufficient documentation

## 2021-04-07 DIAGNOSIS — R531 Weakness: Secondary | ICD-10-CM | POA: Insufficient documentation

## 2021-04-07 DIAGNOSIS — R202 Paresthesia of skin: Secondary | ICD-10-CM | POA: Diagnosis not present

## 2021-04-07 DIAGNOSIS — Z79899 Other long term (current) drug therapy: Secondary | ICD-10-CM | POA: Insufficient documentation

## 2021-04-07 DIAGNOSIS — K625 Hemorrhage of anus and rectum: Secondary | ICD-10-CM | POA: Diagnosis not present

## 2021-04-07 DIAGNOSIS — R479 Unspecified speech disturbances: Secondary | ICD-10-CM | POA: Diagnosis not present

## 2021-04-07 DIAGNOSIS — Z87891 Personal history of nicotine dependence: Secondary | ICD-10-CM | POA: Diagnosis not present

## 2021-04-07 LAB — PROTIME-INR
INR: 0.9 (ref 0.8–1.2)
Prothrombin Time: 12.1 seconds (ref 11.4–15.2)

## 2021-04-07 LAB — CBC WITH DIFFERENTIAL/PLATELET
Abs Immature Granulocytes: 0.02 10*3/uL (ref 0.00–0.07)
Basophils Absolute: 0 10*3/uL (ref 0.0–0.1)
Basophils Relative: 0 %
Eosinophils Absolute: 0.3 10*3/uL (ref 0.0–0.5)
Eosinophils Relative: 5 %
HCT: 34.7 % — ABNORMAL LOW (ref 36.0–46.0)
Hemoglobin: 11.3 g/dL — ABNORMAL LOW (ref 12.0–15.0)
Immature Granulocytes: 0 %
Lymphocytes Relative: 36 %
Lymphs Abs: 2.6 10*3/uL (ref 0.7–4.0)
MCH: 30.1 pg (ref 26.0–34.0)
MCHC: 32.6 g/dL (ref 30.0–36.0)
MCV: 92.3 fL (ref 80.0–100.0)
Monocytes Absolute: 0.8 10*3/uL (ref 0.1–1.0)
Monocytes Relative: 11 %
Neutro Abs: 3.5 10*3/uL (ref 1.7–7.7)
Neutrophils Relative %: 48 %
Platelets: 186 10*3/uL (ref 150–400)
RBC: 3.76 MIL/uL — ABNORMAL LOW (ref 3.87–5.11)
RDW: 14 % (ref 11.5–15.5)
WBC: 7.2 10*3/uL (ref 4.0–10.5)
nRBC: 0 % (ref 0.0–0.2)

## 2021-04-07 LAB — COMPREHENSIVE METABOLIC PANEL
ALT: 17 U/L (ref 0–44)
AST: 19 U/L (ref 15–41)
Albumin: 3.5 g/dL (ref 3.5–5.0)
Alkaline Phosphatase: 69 U/L (ref 38–126)
Anion gap: 7 (ref 5–15)
BUN: 12 mg/dL (ref 8–23)
CO2: 28 mmol/L (ref 22–32)
Calcium: 8.3 mg/dL — ABNORMAL LOW (ref 8.9–10.3)
Chloride: 91 mmol/L — ABNORMAL LOW (ref 98–111)
Creatinine, Ser: 0.96 mg/dL (ref 0.44–1.00)
GFR, Estimated: >60 mL/min (ref >60)
Glucose, Bld: 81 mg/dL (ref 70–99)
Potassium: 3.9 mmol/L (ref 3.5–5.1)
Sodium: 126 mmol/L — ABNORMAL LOW (ref 135–145)
Total Bilirubin: 0.5 mg/dL (ref 0.3–1.2)
Total Protein: 6.6 g/dL (ref 6.5–8.1)

## 2021-04-07 LAB — OCCULT BLOOD X 1 CARD TO LAB, STOOL: Fecal Occult Bld: POSITIVE — AB

## 2021-04-07 MED ORDER — SODIUM CHLORIDE 0.9 % IV BOLUS: 1000.0000 mL | Freq: Once | INTRAVENOUS | Status: DC

## 2021-04-07 NOTE — ED Provider Notes (Signed)
[facility] EMERGENCY DEPARTMENT Provider Note  CSN: [id number] Arrival date & time: 04/07/21 1226    History Chief Complaint  Patient presents with  . Rectal Bleeding    HPI  Jennifer Calhoun is a 80 y.o. female reports she noticed some blood in her stool yesterday described as dark red/clots, not mixed with stool. She has not had any vomiting or abdominal pain. She does not take any blood thinners. This morning she reports she woke up feeling generally weak, lightheaded with speech difficulty she reports as having trouble getting her words out. She had been feeling better but the speech disturbance returned as she arrived in the ED. She reports the difference in her speech is that usually she talks faster and now she feels like she is talking slower. She also reports some tingling around her lips.     Past Medical History:  Diagnosis Date  . History of small bowel obstruction    x3  . History of TIA (transient ischemic attack) 2003  . Hyperlipemia   . Seasonal allergies   . Wears glasses     Past Surgical History:  Procedure Laterality Date  . CATARACT EXTRACTION     both eyes  . CHOLECYSTECTOMY N/A 03/22/2015   Procedure: LAPAROSCOPIC CHOLECYSTECTOMY ;  Surgeon: Abigail Miyamoto, MD;  Location:  [facility];  Service: General;  Laterality: N/A;  . COLONOSCOPY    . FEMORAL HERNIA REPAIR  2000   small bowel resection-abstruction  . TOTAL ABDOMINAL HYSTERECTOMY      History reviewed. No pertinent family history.  Social History   Tobacco Use  . Smoking status: Former Smoker    Quit date: 03/20/1972    Years since quitting: 49.0  . Smokeless tobacco: Never Used  Vaping Use  . Vaping Use: Never used  Substance Use Topics  . Alcohol use: Yes    Comment: glass wine nightly  . Drug use: No     Home Medications Prior to Admission medications   Medication Sig Start Date End Date Taking? Authorizing Provider  alendronate (FOSAMAX) 10 MG  tablet Take 10 mg by mouth daily. 05/27/20  Yes [provider]  ibuprofen (ADVIL,MOTRIN) 800 MG tablet Take 1 tablet (800 mg total) by mouth 3 (three) times daily. 06/25/16  Yes Margarita Grizzle, MD  loratadine (CLARITIN) 10 MG tablet Take 10 mg by mouth daily.   Yes [provider]  naproxen (NAPROSYN) 500 MG tablet Take 500 mg by mouth 2 (two) times daily as needed. 06/10/20  Yes [provider]  predniSONE (DELTASONE) 10 MG tablet Take 2 tablets (20 mg total) by mouth daily. 06/25/16  Yes Margarita Grizzle, MD  atorvastatin (LIPITOR) 20 MG tablet Take 20 mg by mouth daily.  04/07/21  [provider]  furosemide (LASIX) 20 MG tablet Take 20 mg by mouth daily as needed. 01/10/20 04/07/21  [provider]     Allergies    Penicillins, [place] cypress allergy skin test, Cat hair extract, Gramineae pollens, Latex, and Shellfish allergy   Review of Systems   Review of Systems A comprehensive review of systems was completed and negative except as noted in HPI.    Physical Exam BP (!) 181/78   Pulse 76   Temp 97.9 F (36.6 C) (Oral)   Resp 20   Ht 5\' 4"  (1.626 m)   Wt 79.4 kg   SpO2 100%   BMI 30.04 kg/m   Physical Exam Vitals and nursing note reviewed.  Constitutional:  Appearance: Normal appearance.  HENT:     Head: Normocephalic and atraumatic.     Nose: Nose normal.     Mouth/Throat:     Mouth: Mucous membranes are moist.  Eyes:     Extraocular Movements: Extraocular movements intact.     Conjunctiva/sclera: Conjunctivae normal.  Cardiovascular:     Rate and Rhythm: Normal rate.  Pulmonary:     Effort: Pulmonary effort is normal.     Breath sounds: Normal breath sounds.  Abdominal:     General: Abdomen is flat.     Palpations: Abdomen is soft.     Tenderness: There is no abdominal tenderness.  Genitourinary:    Comments: Small non-thrombosed internal hemorrhoid, no fissure, stool is dark red Musculoskeletal:        General: No  swelling. Normal range of motion.     Cervical back: Neck supple.  Skin:    General: Skin is warm and dry.  Neurological:     General: No focal deficit present.     Mental Status: She is alert.     Cranial Nerves: No cranial nerve deficit.     Sensory: No sensory deficit.     Motor: No weakness.     Comments: No expressive aphasia or dysarthria.   Psychiatric:     Comments: Anxious      ED Results / Procedures / Treatments   Labs (all labs ordered are listed, but only abnormal results are displayed) Labs Reviewed  COMPREHENSIVE METABOLIC PANEL - Abnormal; Notable for the following components:      Result Value   Sodium 126 (*)    Chloride 91 (*)    Calcium 8.3 (*)    All other components within normal limits  CBC WITH DIFFERENTIAL/PLATELET - Abnormal; Notable for the following components:   RBC 3.76 (*)    Hemoglobin 11.3 (*)    HCT 34.7 (*)    All other components within normal limits  OCCULT BLOOD X 1 CARD TO LAB, STOOL - Abnormal; Notable for the following components:   Fecal Occult Bld POSITIVE (*)    All other components within normal limits  PROTIME-INR    EKG None   Radiology No results found.  Procedures Procedures  Medications Ordered in the ED Medications  sodium chloride 0.9 % bolus 1,000 mL (has no administration in time range)     MDM Rules/Calculators/A&P MDM Patient here with rectal bleeding, not on a blood thinner. Her speech change is not due to stroke, she does not have any focal neuro deficits or speech disturbance during my evaluation. Will check labs, give IVF for general weakness.  ED Course  I have reviewed the triage vital signs and the nursing notes.  Pertinent labs & imaging results that were available during my care of the patient were reviewed by me and considered in my medical decision making (see chart for details).  Clinical Course as of 04/07/21 1529  Sun Apr 07, 2021  1337 Hemoccult confirmed positive.  [CS]  1358 CBC  with mild decrease in Hgb compared to previous.  [CS]  1411 INR is normal.  [CS]  1425 CMP with mild hyponatremia, otherwise unremarkable.  [CS]  1438 Patient has had a small BM with brown stool, no significant melena or hematochezia. Discussed admission for observation vs outpatient follow up. She has seen Dr. Loreta Ave, GI in the past and wants to try to call her to get her advice.  [CS]  1458 Patient was unable to contact Dr. Loreta Ave,  but she talked to some other family/friends who will keep an eye on her and she also has a Health and safety inspector system. She would like to go home. Recommend she follow up with GI as an outpatient, RTED if her bleeding worsens or if she has any other new symptoms.  [CS]    Clinical Course User Index [CS] Pollyann Savoy, MD    Final Clinical Impression(s) / ED Diagnoses Final diagnoses:  Bright red rectal bleeding    Rx / DC Orders ED Discharge Orders    None       Pollyann Savoy, MD 04/07/21 1529

## 2021-04-07 NOTE — ED Triage Notes (Signed)
Pt arrives to ED with c/o of rectal bleeding since yesterday. Pt states that she had x5 episodes of BRBPR yesterday and x1 today. Pt states she woke up this morning at 730am with extreme weakness, lethergy, lightheadedness, and aphasia. Pt states she has had a difficult time getting her words out. States she feels dizzy which comes and goes. Pt denies all pain. No one sided weakness, sensory, or memory issues noted.

## 2021-04-07 NOTE — ED Notes (Signed)
Entered room, patient states she suddenly has hard time speaking,forming her words. MD at bedside

## 2021-04-08 DIAGNOSIS — E871 Hypo-osmolality and hyponatremia: Secondary | ICD-10-CM | POA: Diagnosis not present

## 2021-04-08 DIAGNOSIS — R42 Dizziness and giddiness: Secondary | ICD-10-CM | POA: Diagnosis not present

## 2021-04-08 DIAGNOSIS — K625 Hemorrhage of anus and rectum: Secondary | ICD-10-CM | POA: Diagnosis not present

## 2021-04-08 DIAGNOSIS — K5730 Diverticulosis of large intestine without perforation or abscess without bleeding: Secondary | ICD-10-CM | POA: Diagnosis not present

## 2021-04-18 DIAGNOSIS — R42 Dizziness and giddiness: Secondary | ICD-10-CM | POA: Diagnosis not present

## 2021-04-18 DIAGNOSIS — E871 Hypo-osmolality and hyponatremia: Secondary | ICD-10-CM | POA: Diagnosis not present

## 2021-04-18 DIAGNOSIS — K625 Hemorrhage of anus and rectum: Secondary | ICD-10-CM | POA: Diagnosis not present

## 2021-04-18 DIAGNOSIS — K5730 Diverticulosis of large intestine without perforation or abscess without bleeding: Secondary | ICD-10-CM | POA: Diagnosis not present

## 2021-04-22 ENCOUNTER — Encounter (HOSPITAL_COMMUNITY): Payer: Self-pay | Admitting: *Deleted

## 2021-04-22 ENCOUNTER — Emergency Department (HOSPITAL_COMMUNITY): Payer: Medicare HMO

## 2021-04-22 ENCOUNTER — Inpatient Hospital Stay (HOSPITAL_COMMUNITY)
Admission: EM | Admit: 2021-04-22 | Discharge: 2021-04-25 | DRG: 101 | Disposition: A | Discharge status: Home Health | Payer: Medicare HMO | Attending: Internal Medicine | Admitting: Internal Medicine

## 2021-04-22 ENCOUNTER — Other Ambulatory Visit: Payer: Self-pay

## 2021-04-22 ENCOUNTER — Inpatient Hospital Stay (HOSPITAL_COMMUNITY): Payer: Medicare HMO

## 2021-04-22 DIAGNOSIS — Z91013 Allergy to seafood: Secondary | ICD-10-CM

## 2021-04-22 DIAGNOSIS — I672 Cerebral atherosclerosis: Secondary | ICD-10-CM | POA: Diagnosis not present

## 2021-04-22 DIAGNOSIS — Z9049 Acquired absence of other specified parts of digestive tract: Secondary | ICD-10-CM

## 2021-04-22 DIAGNOSIS — R0600 Dyspnea, unspecified: Secondary | ICD-10-CM

## 2021-04-22 DIAGNOSIS — Z20822 Contact with and (suspected) exposure to covid-19: Secondary | ICD-10-CM | POA: Diagnosis present

## 2021-04-22 DIAGNOSIS — Z79899 Other long term (current) drug therapy: Secondary | ICD-10-CM

## 2021-04-22 DIAGNOSIS — G459 Transient cerebral ischemic attack, unspecified: Secondary | ICD-10-CM | POA: Diagnosis not present

## 2021-04-22 DIAGNOSIS — I6521 Occlusion and stenosis of right carotid artery: Secondary | ICD-10-CM | POA: Diagnosis not present

## 2021-04-22 DIAGNOSIS — Z91040 Latex allergy status: Secondary | ICD-10-CM

## 2021-04-22 DIAGNOSIS — R479 Unspecified speech disturbances: Secondary | ICD-10-CM | POA: Diagnosis not present

## 2021-04-22 DIAGNOSIS — Z880 Allergy status to penicillin: Secondary | ICD-10-CM

## 2021-04-22 DIAGNOSIS — Z888 Allergy status to other drugs, medicaments and biological substances status: Secondary | ICD-10-CM | POA: Diagnosis not present

## 2021-04-22 DIAGNOSIS — I6789 Other cerebrovascular disease: Secondary | ICD-10-CM | POA: Diagnosis not present

## 2021-04-22 DIAGNOSIS — Z7983 Long term (current) use of bisphosphonates: Secondary | ICD-10-CM

## 2021-04-22 DIAGNOSIS — E860 Dehydration: Secondary | ICD-10-CM | POA: Diagnosis present

## 2021-04-22 DIAGNOSIS — Z90710 Acquired absence of both cervix and uterus: Secondary | ICD-10-CM | POA: Diagnosis not present

## 2021-04-22 DIAGNOSIS — Q283 Other malformations of cerebral vessels: Secondary | ICD-10-CM | POA: Diagnosis not present

## 2021-04-22 DIAGNOSIS — R42 Dizziness and giddiness: Secondary | ICD-10-CM | POA: Diagnosis not present

## 2021-04-22 DIAGNOSIS — Z9842 Cataract extraction status, left eye: Secondary | ICD-10-CM

## 2021-04-22 DIAGNOSIS — E785 Hyperlipidemia, unspecified: Secondary | ICD-10-CM | POA: Diagnosis present

## 2021-04-22 DIAGNOSIS — Z87891 Personal history of nicotine dependence: Secondary | ICD-10-CM

## 2021-04-22 DIAGNOSIS — I10 Essential (primary) hypertension: Secondary | ICD-10-CM | POA: Diagnosis not present

## 2021-04-22 DIAGNOSIS — R0602 Shortness of breath: Secondary | ICD-10-CM | POA: Diagnosis not present

## 2021-04-22 DIAGNOSIS — I080 Rheumatic disorders of both mitral and aortic valves: Secondary | ICD-10-CM | POA: Diagnosis present

## 2021-04-22 DIAGNOSIS — I63231 Cerebral infarction due to unspecified occlusion or stenosis of right carotid arteries: Secondary | ICD-10-CM | POA: Diagnosis not present

## 2021-04-22 DIAGNOSIS — Z8673 Personal history of transient ischemic attack (TIA), and cerebral infarction without residual deficits: Secondary | ICD-10-CM

## 2021-04-22 DIAGNOSIS — Z9841 Cataract extraction status, right eye: Secondary | ICD-10-CM | POA: Diagnosis not present

## 2021-04-22 DIAGNOSIS — E871 Hypo-osmolality and hyponatremia: Secondary | ICD-10-CM | POA: Diagnosis present

## 2021-04-22 DIAGNOSIS — R4701 Aphasia: Secondary | ICD-10-CM | POA: Diagnosis present

## 2021-04-22 DIAGNOSIS — D649 Anemia, unspecified: Secondary | ICD-10-CM | POA: Diagnosis not present

## 2021-04-22 DIAGNOSIS — R7989 Other specified abnormal findings of blood chemistry: Secondary | ICD-10-CM | POA: Diagnosis present

## 2021-04-22 DIAGNOSIS — I160 Hypertensive urgency: Secondary | ICD-10-CM | POA: Diagnosis present

## 2021-04-22 DIAGNOSIS — G40909 Epilepsy, unspecified, not intractable, without status epilepticus: Secondary | ICD-10-CM | POA: Diagnosis not present

## 2021-04-22 DIAGNOSIS — R569 Unspecified convulsions: Secondary | ICD-10-CM | POA: Diagnosis not present

## 2021-04-22 LAB — BASIC METABOLIC PANEL
Anion gap: 7 (ref 5–15)
BUN: <5 mg/dL — ABNORMAL LOW (ref 8–23)
CO2: 23 mmol/L (ref 22–32)
Calcium: 9.1 mg/dL (ref 8.9–10.3)
Chloride: 107 mmol/L (ref 98–111)
Creatinine, Ser: 1.13 mg/dL — ABNORMAL HIGH (ref 0.44–1.00)
GFR, Estimated: 50 mL/min — ABNORMAL LOW (ref >60)
Glucose, Bld: 88 mg/dL (ref 70–99)
Potassium: 3.6 mmol/L (ref 3.5–5.1)
Sodium: 137 mmol/L (ref 135–145)

## 2021-04-22 LAB — CBC
HCT: 35.1 % — ABNORMAL LOW (ref 36.0–46.0)
Hemoglobin: 11 g/dL — ABNORMAL LOW (ref 12.0–15.0)
MCH: 30.2 pg (ref 26.0–34.0)
MCHC: 31.3 g/dL (ref 30.0–36.0)
MCV: 96.4 fL (ref 80.0–100.0)
Platelets: 224 10*3/uL (ref 150–400)
RBC: 3.64 MIL/uL — ABNORMAL LOW (ref 3.87–5.11)
RDW: 14.1 % (ref 11.5–15.5)
WBC: 3.5 10*3/uL — ABNORMAL LOW (ref 4.0–10.5)
nRBC: 0 % (ref 0.0–0.2)

## 2021-04-22 LAB — URINALYSIS, ROUTINE W REFLEX MICROSCOPIC
Bilirubin Urine: NEGATIVE
Glucose, UA: NEGATIVE mg/dL
Hgb urine dipstick: NEGATIVE
Ketones, ur: NEGATIVE mg/dL
Nitrite: NEGATIVE
Protein, ur: NEGATIVE mg/dL
Specific Gravity, Urine: 1.005 (ref 1.005–1.030)
pH: 6 (ref 5.0–8.0)

## 2021-04-22 LAB — RESP PANEL BY RT-PCR (FLU A&B, COVID) ARPGX2
Influenza A by PCR: NEGATIVE
Influenza B by PCR: NEGATIVE
SARS Coronavirus 2 by RT PCR: NEGATIVE

## 2021-04-22 LAB — BRAIN NATRIURETIC PEPTIDE: B Natriuretic Peptide: 171.3 pg/mL — ABNORMAL HIGH (ref 0.0–100.0)

## 2021-04-22 LAB — CBG MONITORING, ED: Glucose-Capillary: 91 mg/dL (ref 70–99)

## 2021-04-22 IMAGING — CT CT HEAD W/O CM
3 series · 16 of 47 positions shown, 19 images · non-contrast
Comparison: [DATE]

CLINICAL DATA: Difficulty forming words and dizziness.

EXAM:
CT HEAD WITHOUT CONTRAST
TECHNIQUE: Contiguous axial images were obtained from the base of the skull
through the vertex without intravenous contrast.

[Series 2: head wo · axial · 0.46mm/px · z∈[+1444,+1574]mm · 10 of 32 slices shown, 13 images]
[im 3/32  brain]
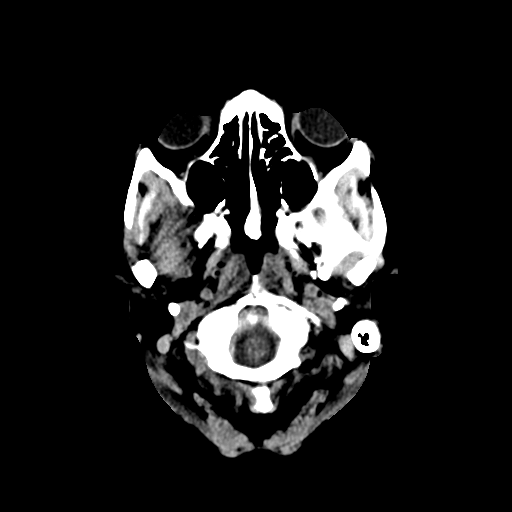
[im 3/32  bone]
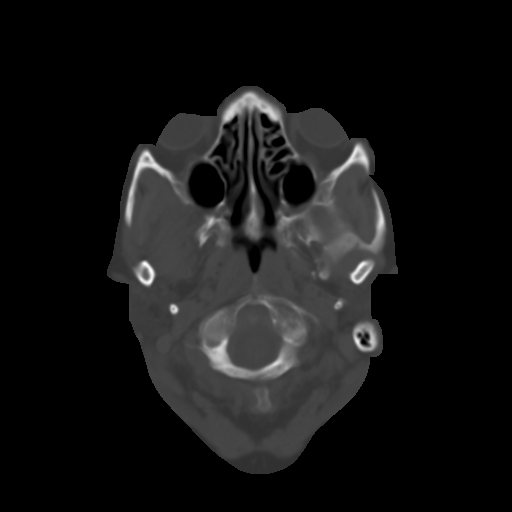
[im 6/32  brain]
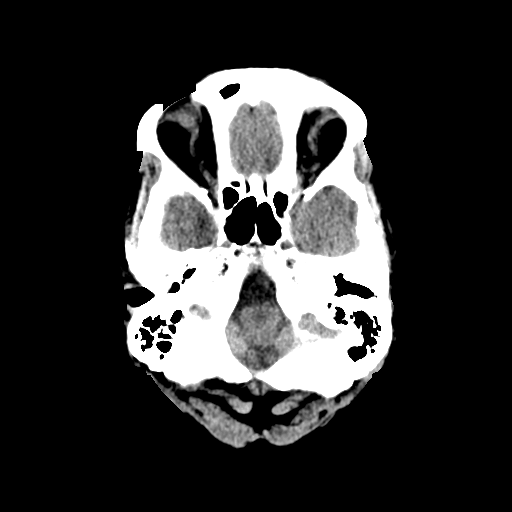
[im 9/32  brain]
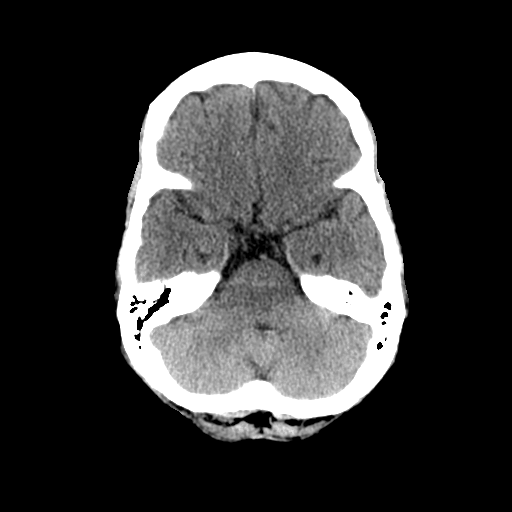
[im 11/32  brain]
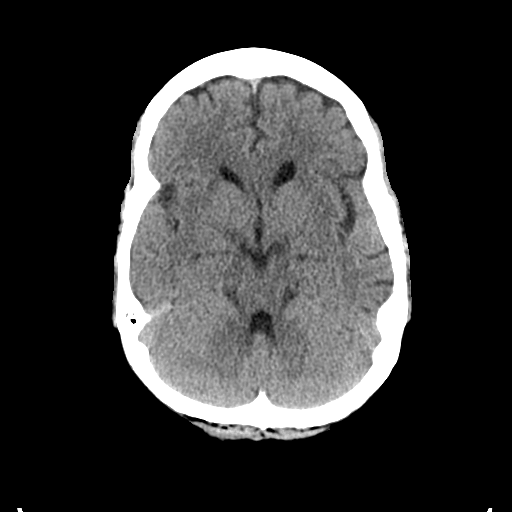
[im 14/32  brain]
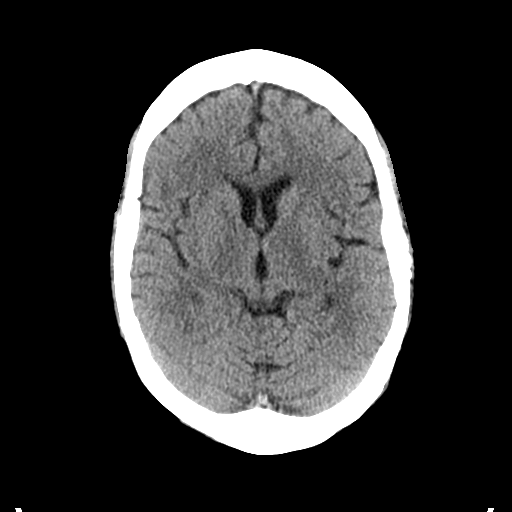
[im 14/32  bone]
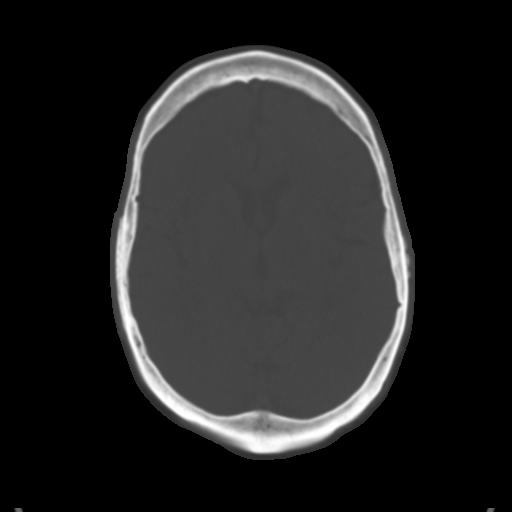
[im 18/32  brain]
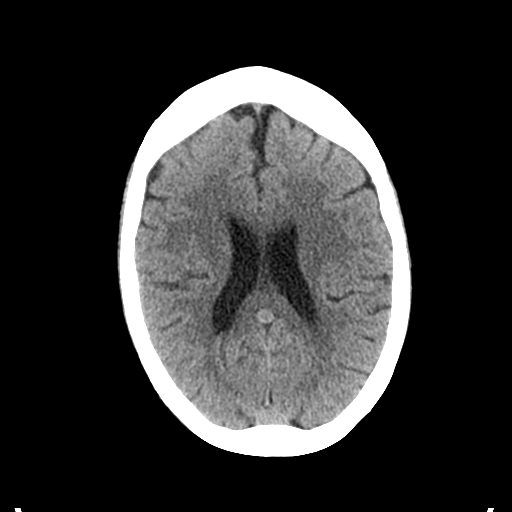
[im 21/32  brain]
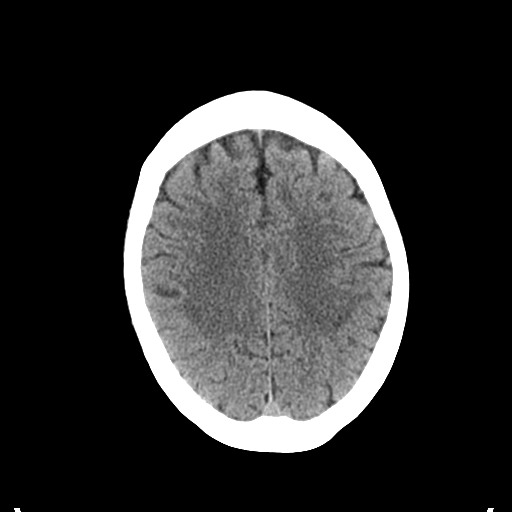
[im 24/32  brain]
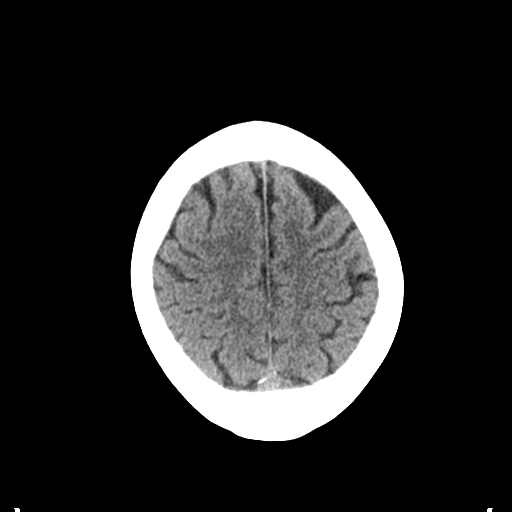
[im 26/32  brain]
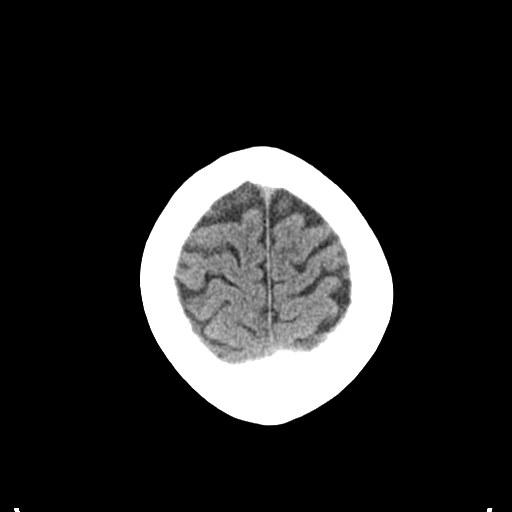
[im 26/32  bone]
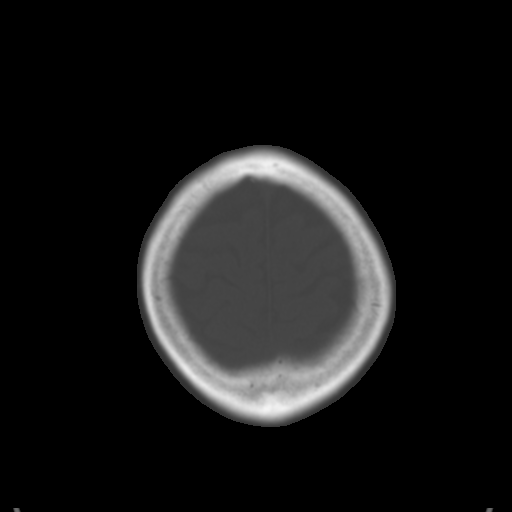
[im 29/32  brain]
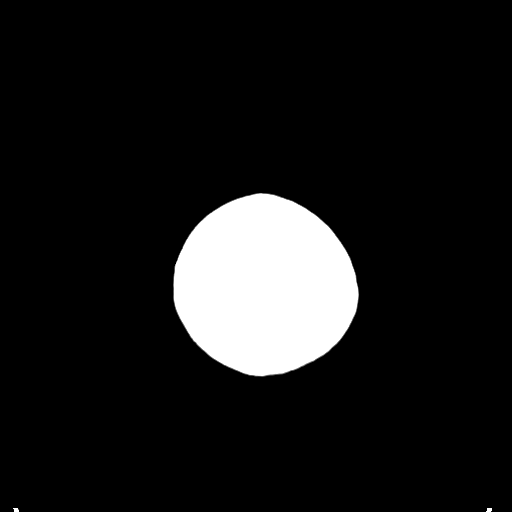

[Series 5: coronal soft tissue · coronal · 0.35mm/px · 3 of 75 slices shown]
[im 27/75  brain]
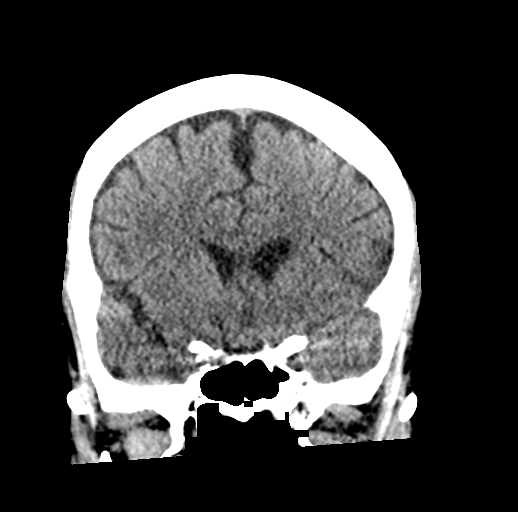
[im 34/75  brain]
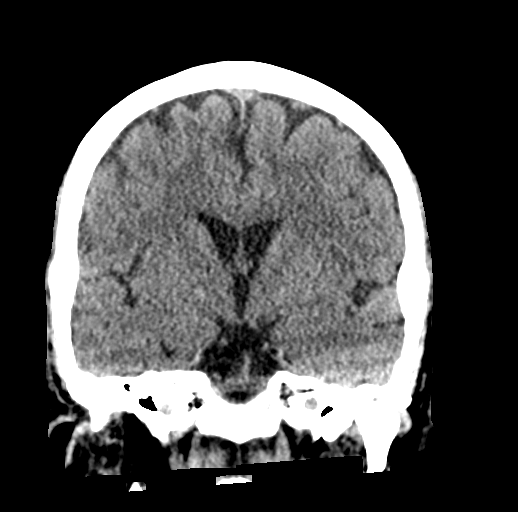
[im 41/75  brain]
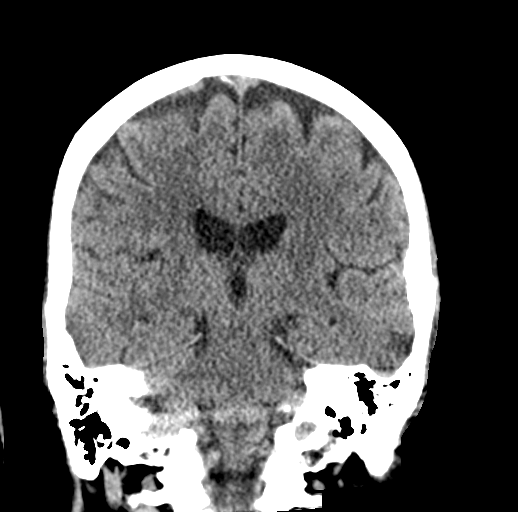

[Series 6: sagittal soft tissue · sagittal · 0.37mm/px · 3 of 58 slices shown]
[im 20/58  brain]
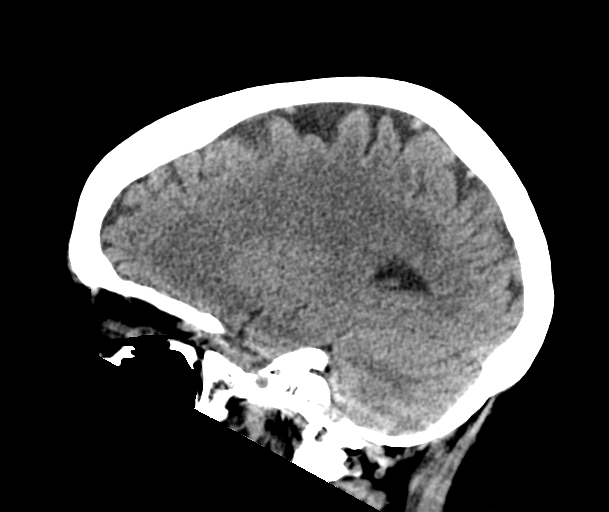
[im 29/58  brain]
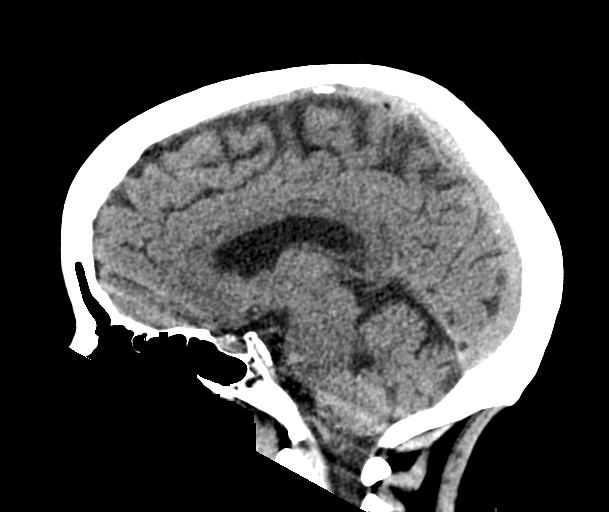
[im 39/58  brain]
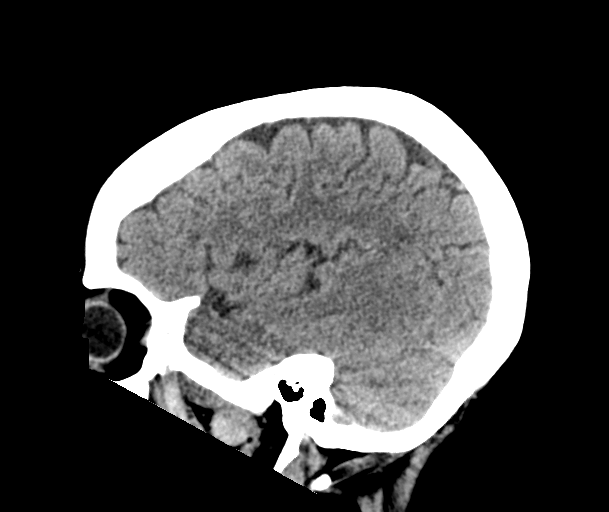

[16 of 47 positions shown; findings below may reference images not displayed]

FINDINGS: Brain: No evidence of acute infarction, hemorrhage, hydrocephalus,
extra-axial collection or mass lesion/mass effect. Mild brain
parenchymal volume loss and deep white matter microangiopathy.

Vascular: No hyperdense vessel or unexpected calcification.

Skull: Normal. Negative for fracture or focal lesion.

Sinuses/Orbits: No acute finding.

Other: None.
IMPRESSION: 1. No acute intracranial abnormality.
2. Mild brain parenchymal atrophy and chronic microvascular disease.

## 2021-04-22 IMAGING — DX DG CHEST 1V PORT
1 series · 1 of 1 positions shown · non-contrast
Comparison: [DATE]

CLINICAL DATA: Shortness of breath and dizziness

EXAM:
PORTABLE CHEST 1 VIEW

[chest ap]
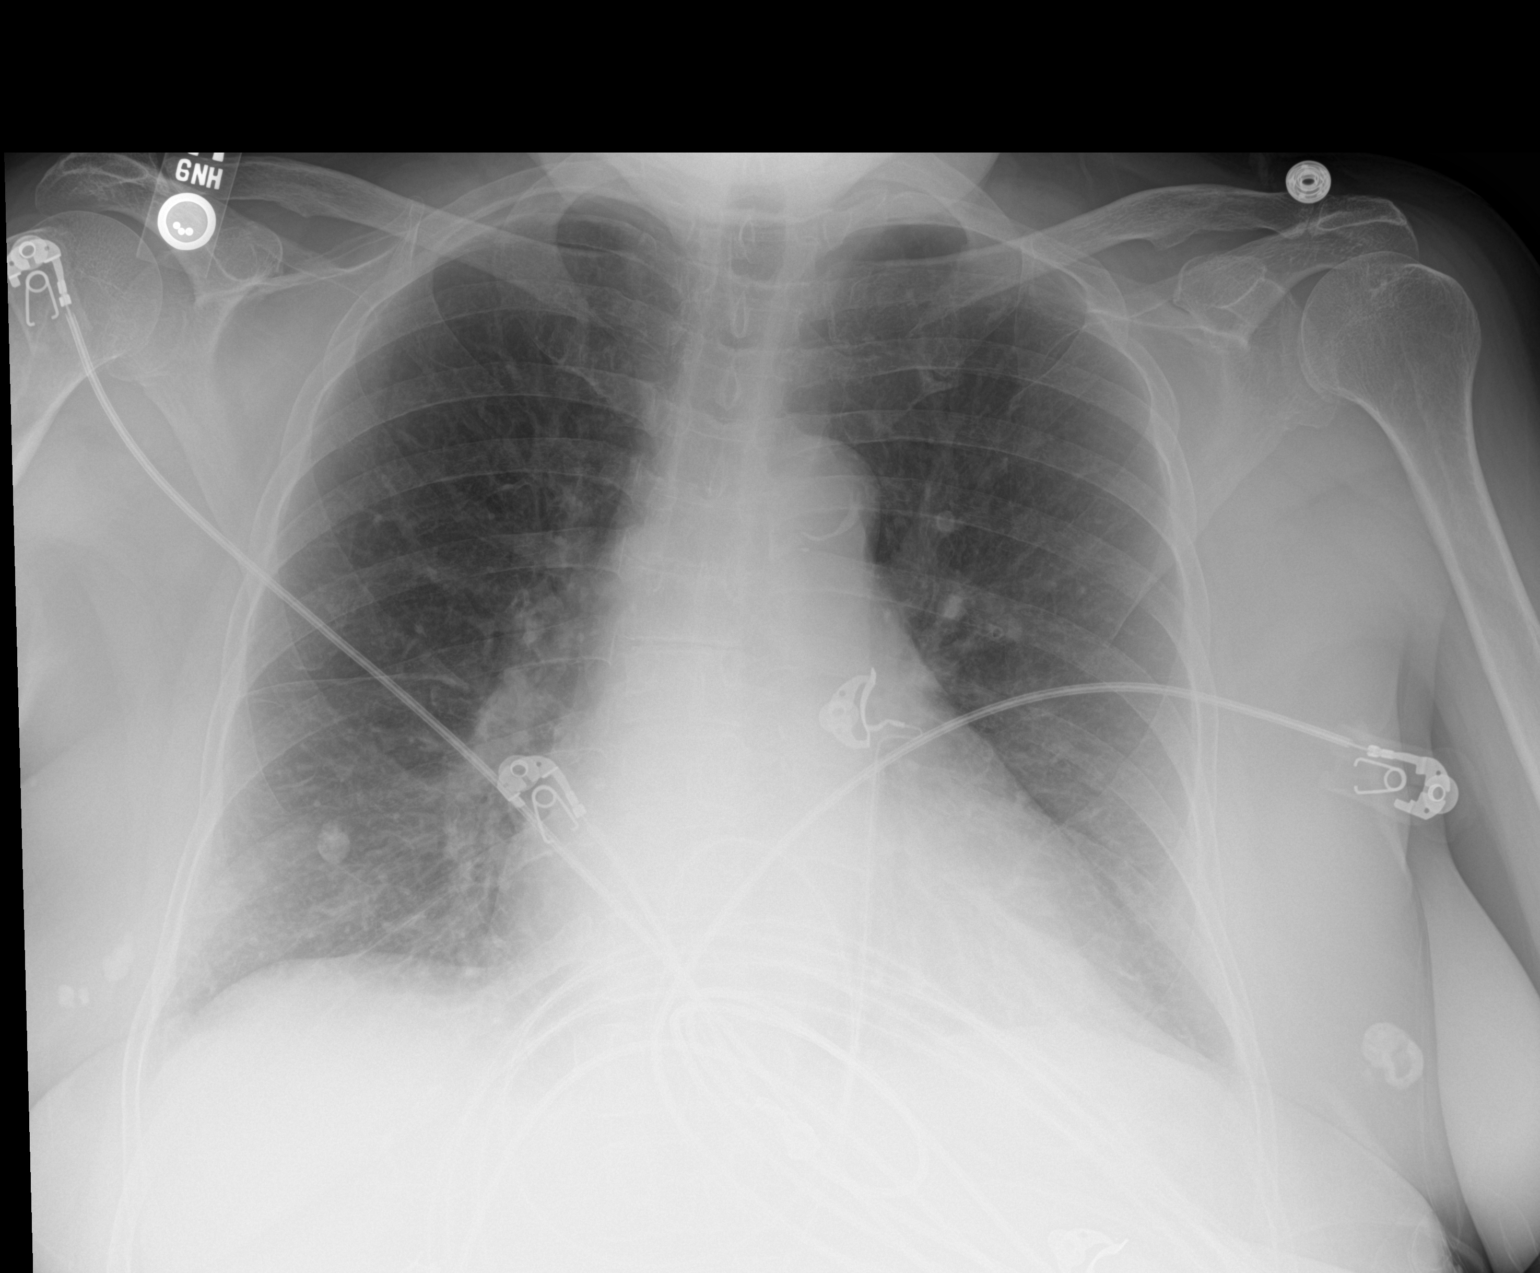

[1 of 1 positions shown; findings below may reference images not displayed]

FINDINGS: Cardiac shadow is mildly prominent but stable. Aortic calcifications
are again seen. Calcifications are noted in the breasts bilaterally
stable in appearance from prior exam and shown to be within the
breasts on prior CT. No bony abnormality is seen. No focal
infiltrate is noted.
IMPRESSION: No active disease.

## 2021-04-22 IMAGING — MR MR HEAD W/O CM
10 of 14 series · 38 of 48 positions shown · IV contrast (gadavist)
Comparison: Prior CT from earlier the same day.

CLINICAL DATA: Initial evaluation for acute TIA.

EXAM:
MRI HEAD WITHOUT CONTRAST
MRA HEAD WITHOUT CONTRAST
MRA NECK WITHOUT AND WITH CONTRAST
TECHNIQUE: Multiplanar, multi-echo pulse sequences of the brain and surrounding
structures were acquired without intravenous contrast. Angiographic
images of the Circle of Willis were acquired using MRA technique
without intravenous contrast. Angiographic images of the neck were
acquired using MRA technique without and with intravenous contrast.
Carotid stenosis measurements (when applicable) are obtained
utilizing NASCET criteria, using the distal internal carotid
diameter as the denominator.
CONTRAST:  8mL GADAVIST GADOBUTROL 1 MMOL/ML IV SOLN

[Series 5: DWI · axial · 3.0mm · 1.36mm/px · z∈[-103,+47]mm · 6 of 104 slices shown (1 of 4)]
[im 1/104]
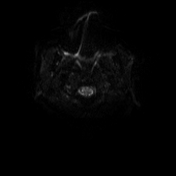
[im 21/104]
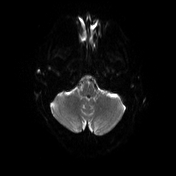
[im 42/104]
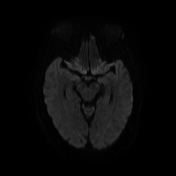
[im 62/104]
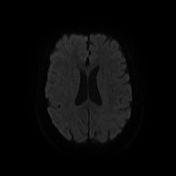
[im 83/104]
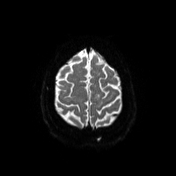
[im 104/104]
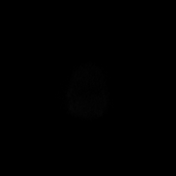

[Series 6: DWI · axial · 3.0mm · 1.36mm/px · z∈[-103,+47]mm · 2 of 52 slices shown (2 of 4)]
[im 1/52]
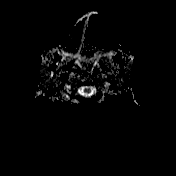
[im 52/52]
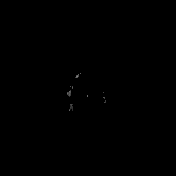

[Series 8: T1 · sagittal · 5.0mm · 0.75mm/px · 2 of 24 slices shown (1 of 2)]
[im 1/24]
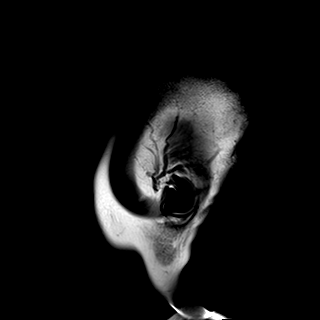
[im 24/24]
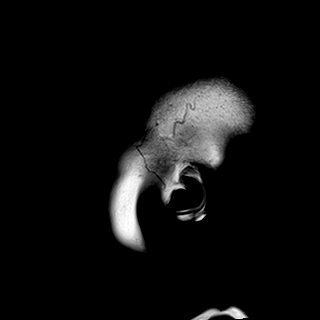

[Series 9: T2 · axial · 5.0mm · 0.62mm/px · z∈[-107,+46]mm · 2 of 25 slices shown (1 of 2)]
[im 1/25]
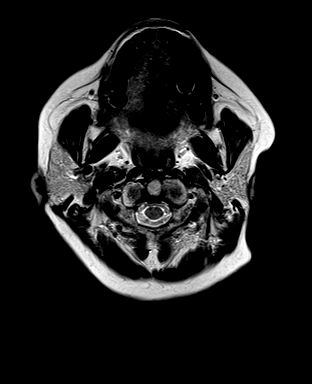
[im 25/25]
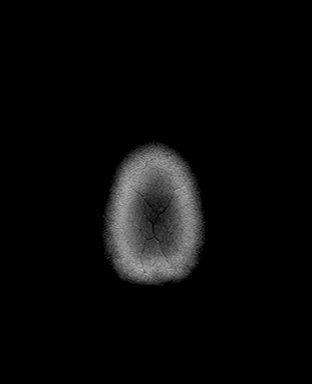

[Series 14: swi_images · axial · 3.0mm · 0.75mm/px · z∈[-112,+50]mm · 4 of 56 slices shown]
[im 1/56]
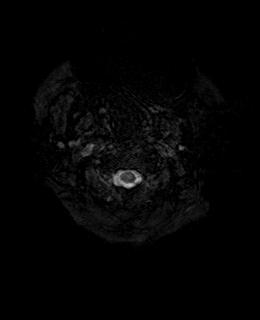
[im 19/56]
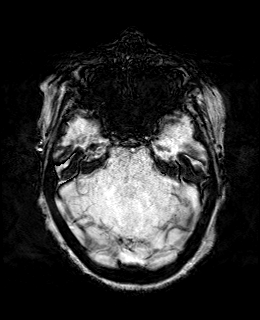
[im 37/56]
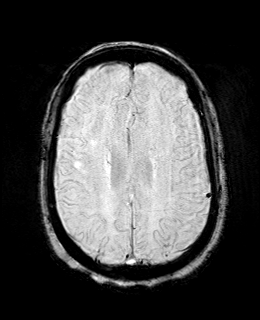
[im 56/56]
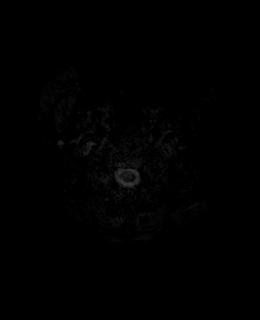

[Series 16: FLAIR · axial · 3.0mm · 0.75mm/px · z∈[-106,+44]mm · 3 of 52 slices shown]
[im 1/52]
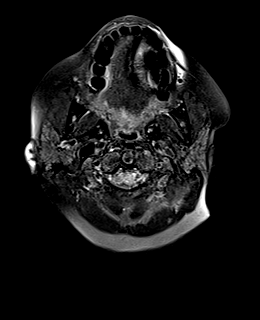
[im 26/52]
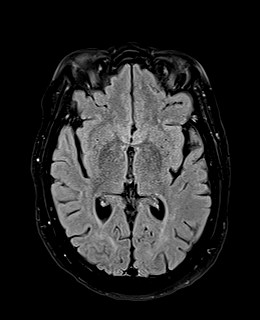
[im 52/52]
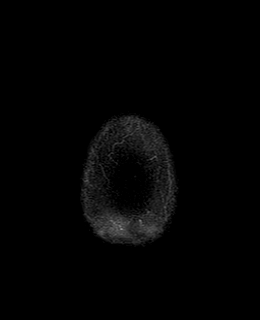

[Series 26: T1 · axial · 1.0mm · 0.94mm/px · z∈[-108,+48]mm · 10 of 160 slices shown (2 of 2)]
[im 1/160]
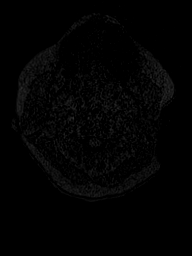
[im 18/160]
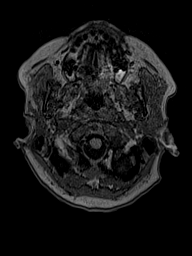
[im 36/160]
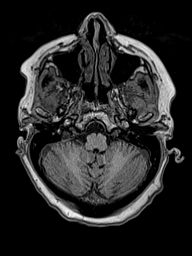
[im 54/160]
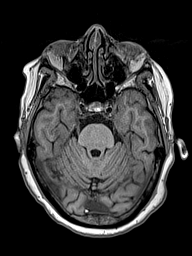
[im 71/160]
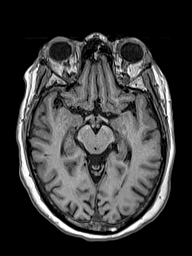
[im 89/160]
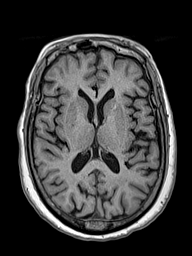
[im 107/160]
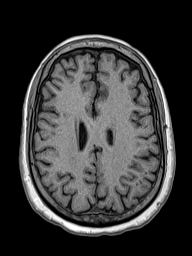
[im 124/160]
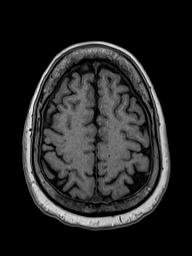
[im 142/160]
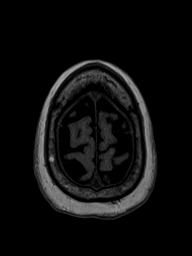
[im 160/160]
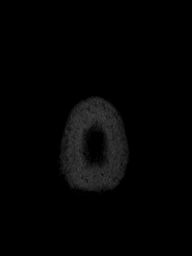

[Series 27: DWI · coronal · 5.0mm · 1.31mm/px · 5 of 72 slices shown (3 of 4)]
[im 1/72]
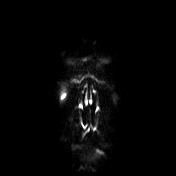
[im 18/72]
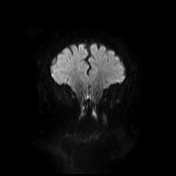
[im 36/72]
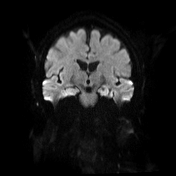
[im 54/72]
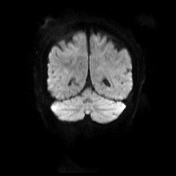
[im 72/72]
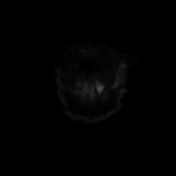

[Series 28: DWI · coronal · 5.0mm · 1.31mm/px · 2 of 36 slices shown (4 of 4)]
[im 1/36]
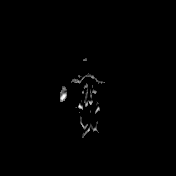
[im 36/36]
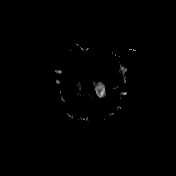

[Series 29: T2 · coronal · 5.0mm · 0.57mm/px · 2 of 30 slices shown (2 of 2)]
[im 1/30]
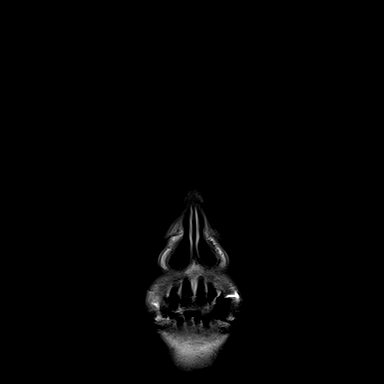
[im 30/30]
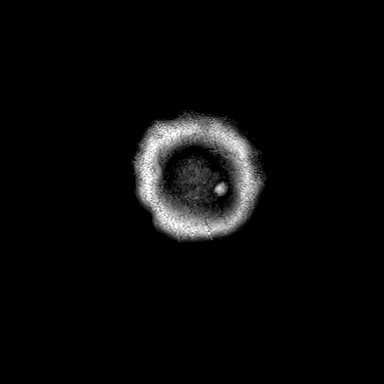

[38 of 48 positions shown; findings below may reference images not displayed]

FINDINGS: MRI HEAD FINDINGS

Brain: Cerebral volume within normal limits for patient age.
Scattered patchy T2/FLAIR hyperintensity within the periventricular,
deep, and subcortical white matter both cerebral hemispheres, most
consistent with chronic small vessel ischemic disease, mild for age.

No abnormal foci of restricted diffusion to suggest acute or
subacute ischemia. Gray-white matter differentiation maintained. No
encephalomalacia to suggest chronic cortical infarction. No evidence
for acute or chronic intracranial hemorrhage.

No mass lesion, midline shift or mass effect no hydrocephalus or
extra-axial fluid collection. Pituitary gland suprasellar region
normal. Midline structures intact.

Vascular: Major intracranial vascular flow voids are maintained.

Skull and upper cervical spine: Craniocervical junction within
normal limits. Bone marrow signal intensity normal. No scalp soft
tissue abnormality.

Sinuses/Orbits: Patient status post bilateral ocular lens
replacement. Paranasal sinuses are clear. No significant mastoid
effusion. Inner ear structures grossly normal.

Other: None.

MRA HEAD FINDINGS

ANTERIOR CIRCULATION:

Visualized distal cervical segments of the internal carotid arteries
are patent with antegrade flow. Petrous, cavernous, and supraclinoid
segments patent without stenosis or other abnormality. A1 segments
patent. Right A1 hypoplastic. Normal anterior communicating artery
complex. Anterior cerebral arteries patent to their distal aspects
without stenosis. Possible short-segment fenestration at the
proximal left A2 segment noted.

Right M1 widely patent. Normal right MCA bifurcation. Distal right
MCA branches well perfused.

Atheromatous irregularity with suspected mild short-segment stenosis
at the proximal left M1 segment (series 12, image 6). Normal left
MCA bifurcation. Distal left MCA branches well perfused.

POSTERIOR CIRCULATION:

Both V4 segments patent to the vertebrobasilar junction without
stenosis. Right vertebral artery dominant. Both PICA patent. Basilar
patent to its distal aspect without stenosis. Superior cerebellar
arteries patent bilaterally. Left PCA supplied via the basilar as
well as a small left posterior communicating artery. Fetal type
origin of the right PCA. Both PCAs perfused to their distal aspects
without stenosis.

No aneurysm.

MRA NECK FINDINGS

AORTIC ARCH: Visualized aortic arch normal in caliber with normal
branch pattern. No hemodynamically significant stenosis seen about
the origin of the great vessels.

RIGHT CAROTID SYSTEM: Visualized right CCA patent to the bifurcation
without stenosis. Atheromatous irregularity about the right carotid
bulb/proximal right ICA with associated short-segment stenosis of up
to 30% by NASCET criteria. Right ICA patent distally without
stenosis, evidence for dissection or occlusion.

LEFT CAROTID SYSTEM: Left CCA patent from its origin to the
bifurcation without stenosis. No significant atheromatous
irregularity or narrowing about the left bifurcation. Left ICA
patent distally without stenosis, evidence for dissection or
occlusion.

VERTEBRAL ARTERIES: Both vertebral arteries arise from subclavian
arteries. Right vertebral artery dominant. No visible proximal
subclavian artery stenosis on this motion degraded exam. Proximal
left vertebral artery somewhat limited assessment due to adjacent
venous contamination. Visualized portions of the vertebral arteries
patent without stenosis, evidence for dissection or occlusion.
IMPRESSION: MRI HEAD IMPRESSION:

1. No acute intracranial abnormality.
2. Mild chronic microvascular ischemic disease for age.

MRA HEAD IMPRESSION:

1. Negative intracranial MRA for large vessel occlusion.
2. Mild intracranial atherosclerotic change for age with associated
mild short-segment stenosis of the proximal left M1 segment. No
other hemodynamically significant or correctable stenosis.

MRA NECK IMPRESSION:

1. Short-segment mild stenosis of up to 30% by NASCET criteria
involving the origin/proximal right ICA.
2. No hemodynamically significant stenosis identified within the
left carotid artery system.
3. Patent vertebral arteries within the neck without stenosis. Right
vertebral artery dominant.

## 2021-04-22 IMAGING — MR MR MRA HEAD W/O CM
2 series · 19 of 48 positions shown · IV contrast (gadavist)
Comparison: Prior CT from earlier the same day.

CLINICAL DATA: Initial evaluation for acute TIA.

EXAM:
MRI HEAD WITHOUT CONTRAST
MRA HEAD WITHOUT CONTRAST
MRA NECK WITHOUT AND WITH CONTRAST
TECHNIQUE: Multiplanar, multi-echo pulse sequences of the brain and surrounding
structures were acquired without intravenous contrast. Angiographic
images of the Circle of Willis were acquired using MRA technique
without intravenous contrast. Angiographic images of the neck were
acquired using MRA technique without and with intravenous contrast.
Carotid stenosis measurements (when applicable) are obtained
utilizing NASCET criteria, using the distal internal carotid
diameter as the denominator.
CONTRAST:  8mL GADAVIST GADOBUTROL 1 MMOL/ML IV SOLN

[Series 1: vessel_scout_head_msum · sagittal · 6.0mm · 0.59mm/px · 6 of 23 slices shown]
[im 1/23]
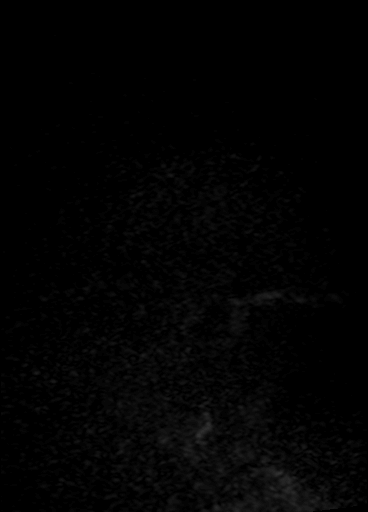
[im 5/23]
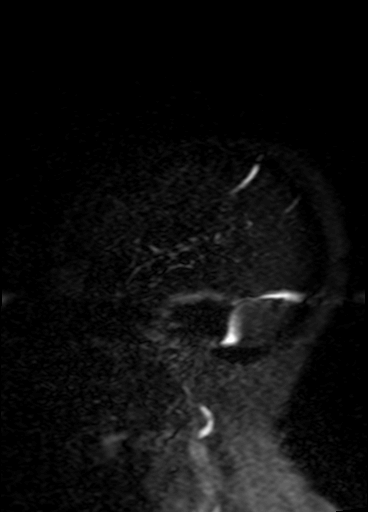
[im 9/23]
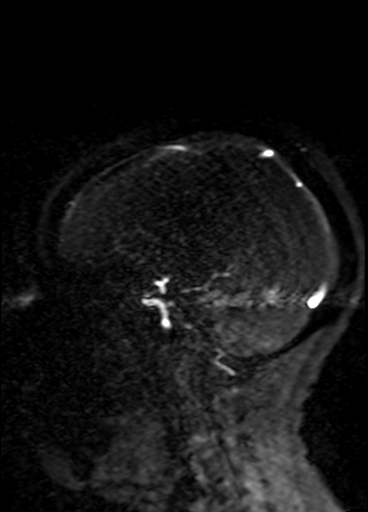
[im 14/23]
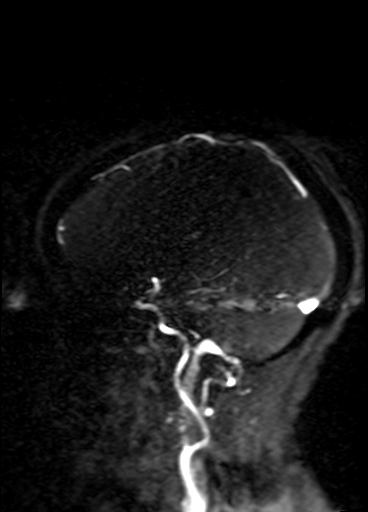
[im 18/23]
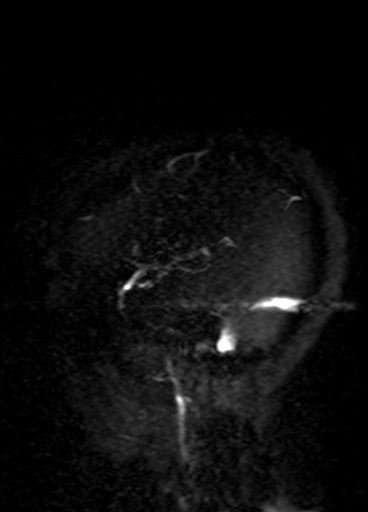
[im 23/23]
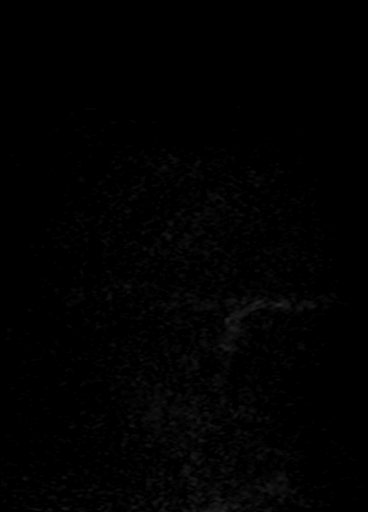

[Series 4: TOF · axial · 0.6mm · 0.35mm/px · z∈[-85,+11]mm · 13 of 172 slices shown]
[im 1/172]
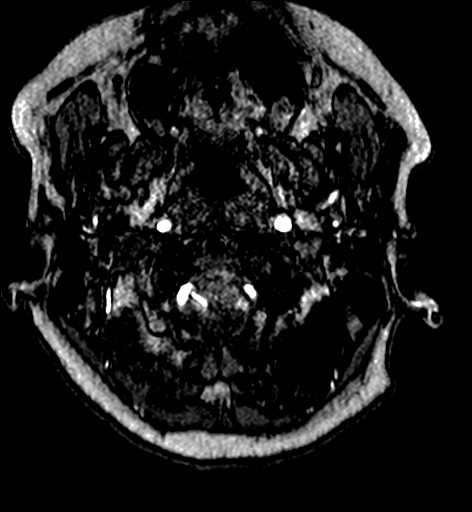
[im 5/172]
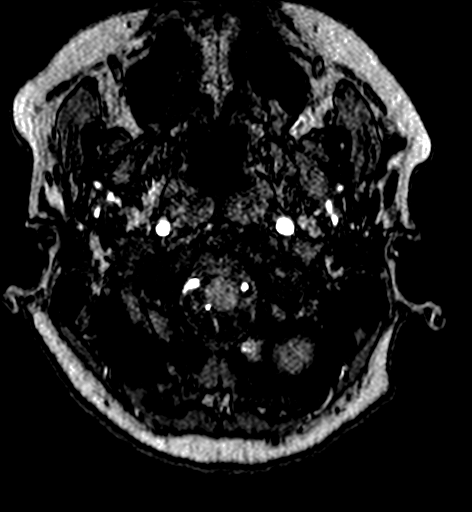
[im 9/172]
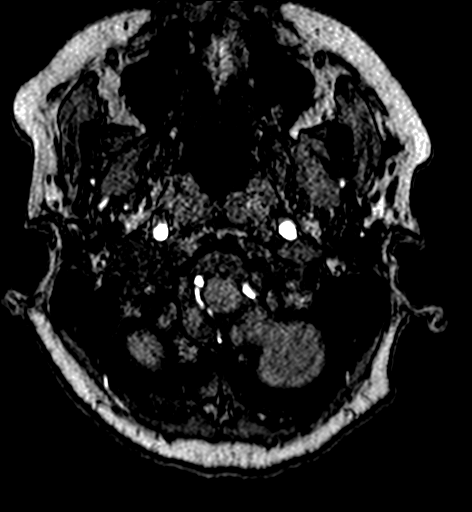
[im 13/172]
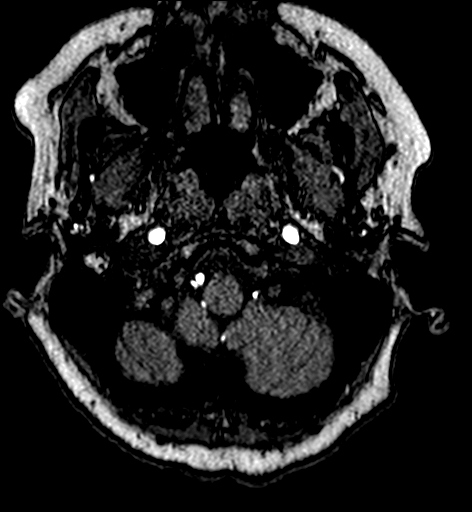
[im 30/172]
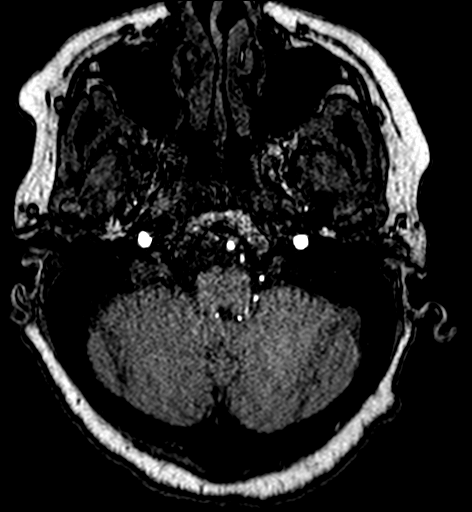
[im 55/172]
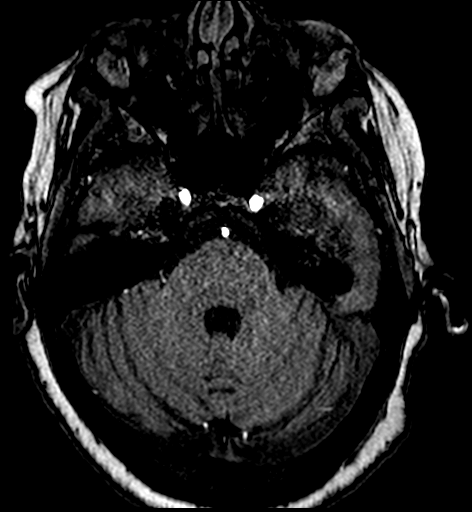
[im 76/172]
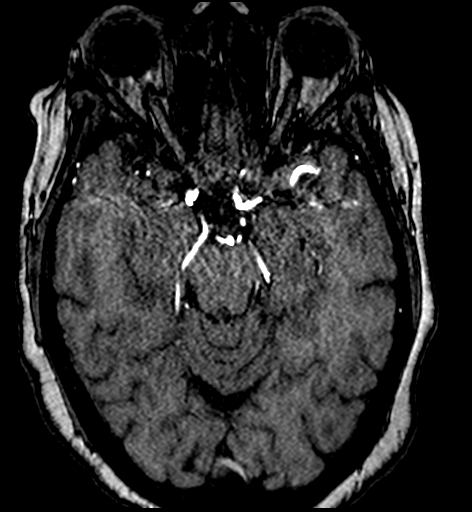
[im 88/172]
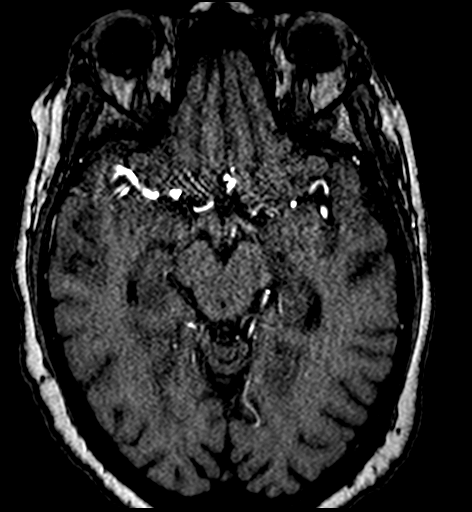
[im 96/172]
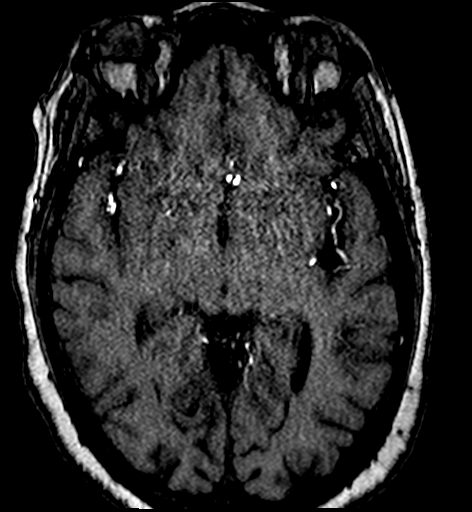
[im 117/172]
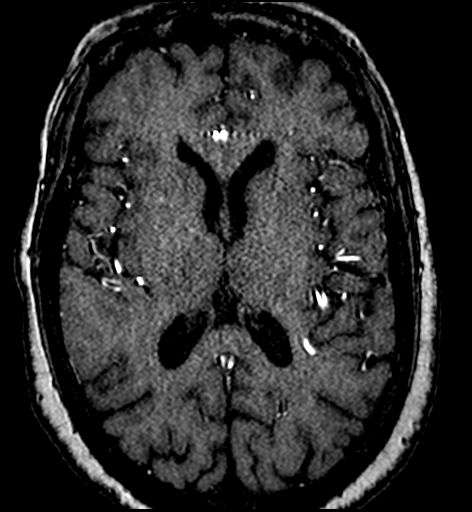
[im 142/172]
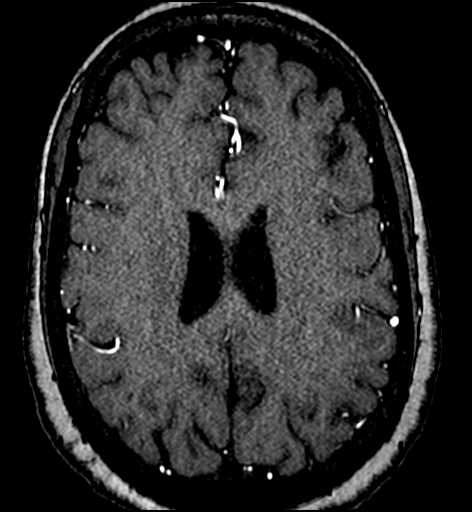
[im 146/172]
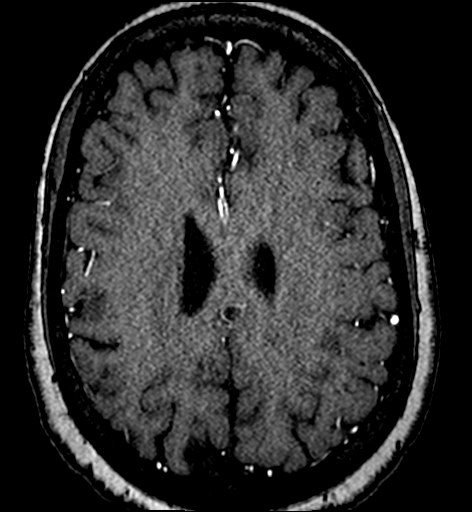
[im 163/172]
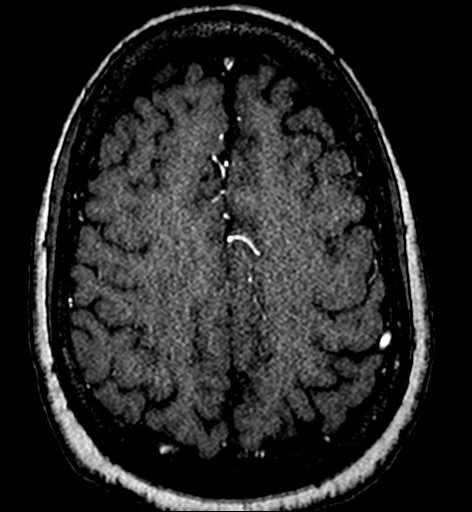

[19 of 48 positions shown; findings below may reference images not displayed]

FINDINGS: MRI HEAD FINDINGS

Brain: Cerebral volume within normal limits for patient age.
Scattered patchy T2/FLAIR hyperintensity within the periventricular,
deep, and subcortical white matter both cerebral hemispheres, most
consistent with chronic small vessel ischemic disease, mild for age.

No abnormal foci of restricted diffusion to suggest acute or
subacute ischemia. Gray-white matter differentiation maintained. No
encephalomalacia to suggest chronic cortical infarction. No evidence
for acute or chronic intracranial hemorrhage.

No mass lesion, midline shift or mass effect no hydrocephalus or
extra-axial fluid collection. Pituitary gland suprasellar region
normal. Midline structures intact.

Vascular: Major intracranial vascular flow voids are maintained.

Skull and upper cervical spine: Craniocervical junction within
normal limits. Bone marrow signal intensity normal. No scalp soft
tissue abnormality.

Sinuses/Orbits: Patient status post bilateral ocular lens
replacement. Paranasal sinuses are clear. No significant mastoid
effusion. Inner ear structures grossly normal.

Other: None.

MRA HEAD FINDINGS

ANTERIOR CIRCULATION:

Visualized distal cervical segments of the internal carotid arteries
are patent with antegrade flow. Petrous, cavernous, and supraclinoid
segments patent without stenosis or other abnormality. A1 segments
patent. Right A1 hypoplastic. Normal anterior communicating artery
complex. Anterior cerebral arteries patent to their distal aspects
without stenosis. Possible short-segment fenestration at the
proximal left A2 segment noted.

Right M1 widely patent. Normal right MCA bifurcation. Distal right
MCA branches well perfused.

Atheromatous irregularity with suspected mild short-segment stenosis
at the proximal left M1 segment (series 12, image 6). Normal left
MCA bifurcation. Distal left MCA branches well perfused.

POSTERIOR CIRCULATION:

Both V4 segments patent to the vertebrobasilar junction without
stenosis. Right vertebral artery dominant. Both PICA patent. Basilar
patent to its distal aspect without stenosis. Superior cerebellar
arteries patent bilaterally. Left PCA supplied via the basilar as
well as a small left posterior communicating artery. Fetal type
origin of the right PCA. Both PCAs perfused to their distal aspects
without stenosis.

No aneurysm.

MRA NECK FINDINGS

AORTIC ARCH: Visualized aortic arch normal in caliber with normal
branch pattern. No hemodynamically significant stenosis seen about
the origin of the great vessels.

RIGHT CAROTID SYSTEM: Visualized right CCA patent to the bifurcation
without stenosis. Atheromatous irregularity about the right carotid
bulb/proximal right ICA with associated short-segment stenosis of up
to 30% by NASCET criteria. Right ICA patent distally without
stenosis, evidence for dissection or occlusion.

LEFT CAROTID SYSTEM: Left CCA patent from its origin to the
bifurcation without stenosis. No significant atheromatous
irregularity or narrowing about the left bifurcation. Left ICA
patent distally without stenosis, evidence for dissection or
occlusion.

VERTEBRAL ARTERIES: Both vertebral arteries arise from subclavian
arteries. Right vertebral artery dominant. No visible proximal
subclavian artery stenosis on this motion degraded exam. Proximal
left vertebral artery somewhat limited assessment due to adjacent
venous contamination. Visualized portions of the vertebral arteries
patent without stenosis, evidence for dissection or occlusion.
IMPRESSION: MRI HEAD IMPRESSION:

1. No acute intracranial abnormality.
2. Mild chronic microvascular ischemic disease for age.

MRA HEAD IMPRESSION:

1. Negative intracranial MRA for large vessel occlusion.
2. Mild intracranial atherosclerotic change for age with associated
mild short-segment stenosis of the proximal left M1 segment. No
other hemodynamically significant or correctable stenosis.

MRA NECK IMPRESSION:

1. Short-segment mild stenosis of up to 30% by NASCET criteria
involving the origin/proximal right ICA.
2. No hemodynamically significant stenosis identified within the
left carotid artery system.
3. Patent vertebral arteries within the neck without stenosis. Right
vertebral artery dominant.

## 2021-04-22 IMAGING — MR MR MRA NECK WO/W CM
5 of 8 series · 28 of 48 positions shown · IV contrast (gadavist)
Comparison: Prior CT from earlier the same day.

CLINICAL DATA: Initial evaluation for acute TIA.

EXAM:
MRI HEAD WITHOUT CONTRAST
MRA HEAD WITHOUT CONTRAST
MRA NECK WITHOUT AND WITH CONTRAST
TECHNIQUE: Multiplanar, multi-echo pulse sequences of the brain and surrounding
structures were acquired without intravenous contrast. Angiographic
images of the Circle of Willis were acquired using MRA technique
without intravenous contrast. Angiographic images of the neck were
acquired using MRA technique without and with intravenous contrast.
Carotid stenosis measurements (when applicable) are obtained
utilizing NASCET criteria, using the distal internal carotid
diameter as the denominator.
CONTRAST:  8mL GADAVIST GADOBUTROL 1 MMOL/ML IV SOLN

[Series 3: tof_2d_tra · axial · 3.5mm · 0.43mm/px · z∈[-214,-70]mm · 4 of 60 slices shown]
[im 1/60]
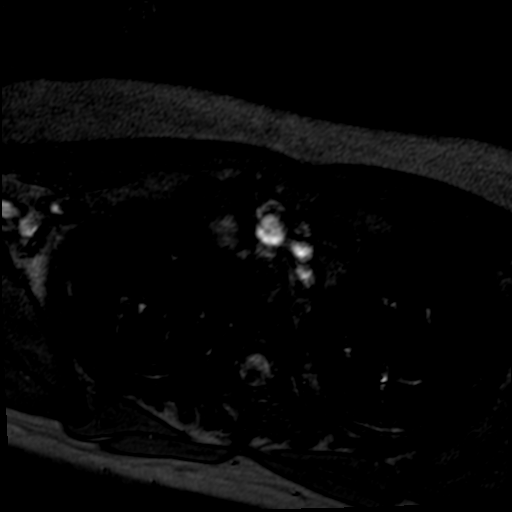
[im 20/60]
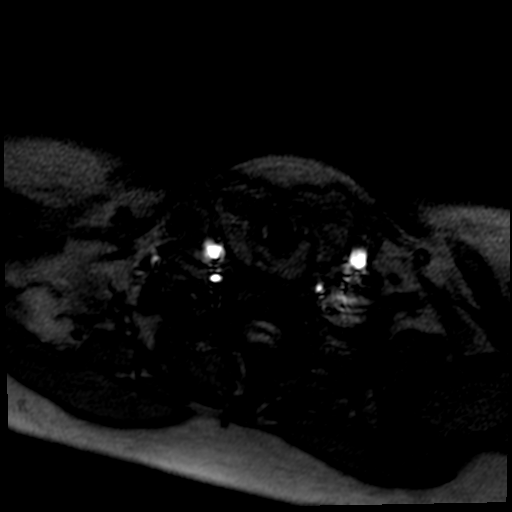
[im 40/60]
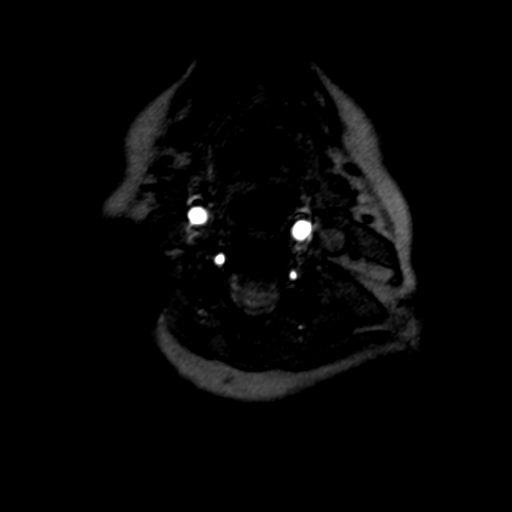
[im 60/60]
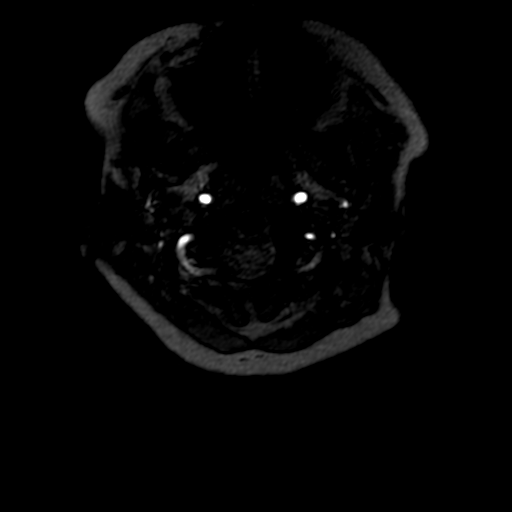

[Series 7: tof_2d_tra_mip_tra · axial · 148.1mm · 0.43mm/px · 1 of 1 slices shown]
[im 1/1]
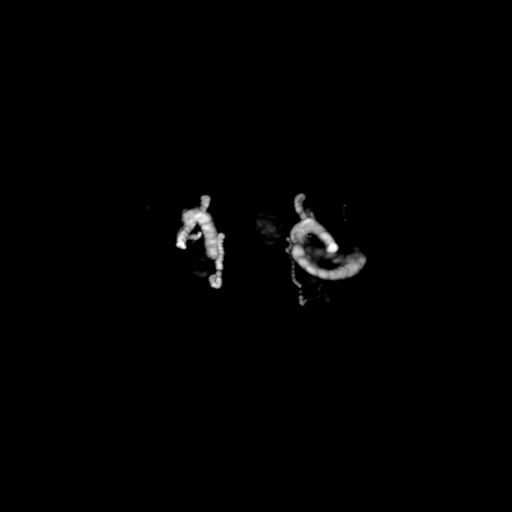

[Series 8: (id)_cor_pre · coronal · 0.8mm · 0.78mm/px · 7 of 96 slices shown]
[im 1/96]
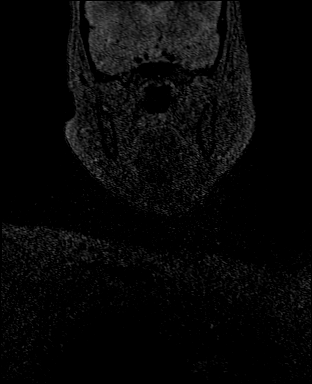
[im 16/96]
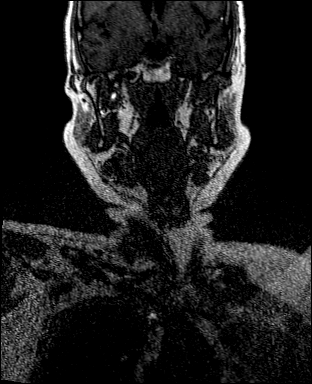
[im 32/96]
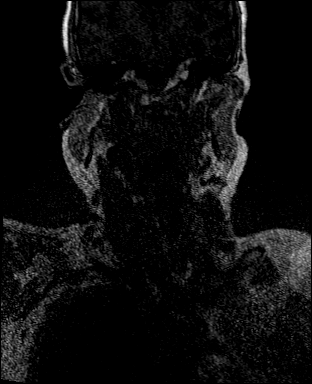
[im 48/96]
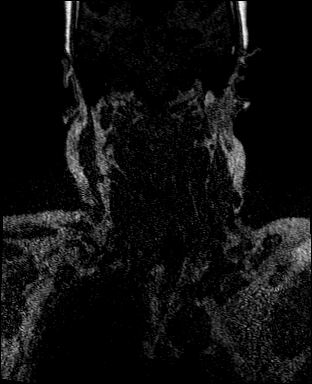
[im 64/96]
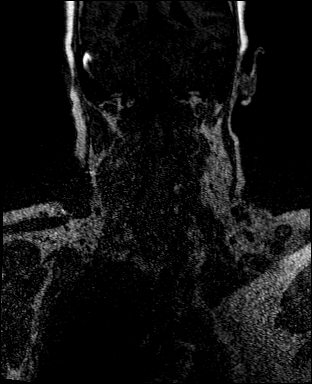
[im 80/96]
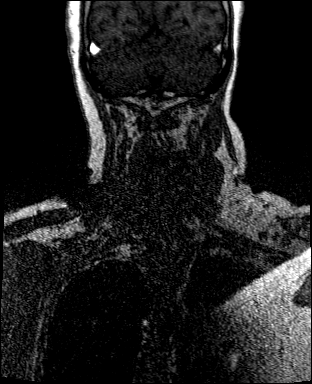
[im 96/96]
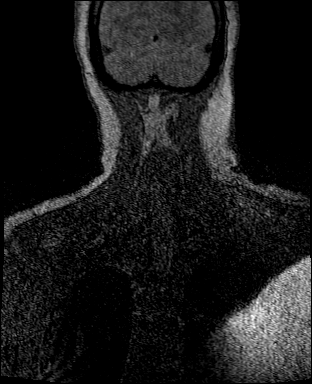

[Series 10: (id)_cor_post · coronal · 0.8mm · 0.78mm/px · 8 of 96 slices shown]
[im 1/96]
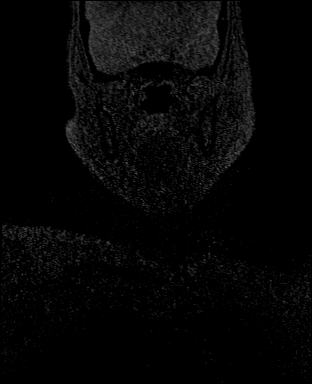
[im 14/96]
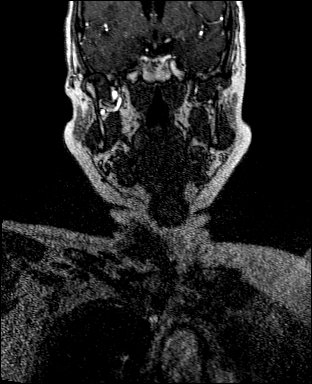
[im 28/96]
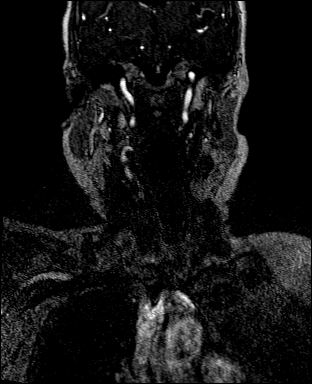
[im 41/96]
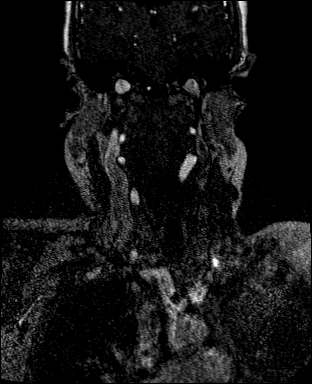
[im 55/96]
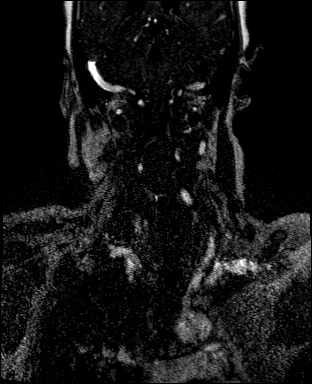
[im 68/96]
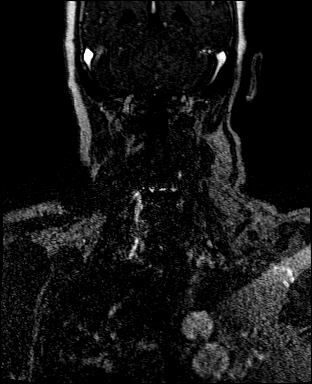
[im 82/96]
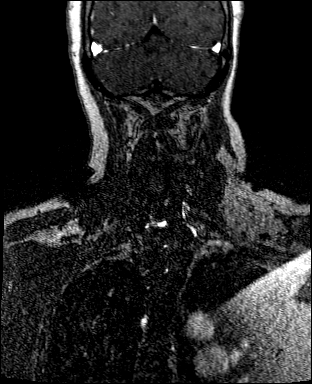
[im 96/96]
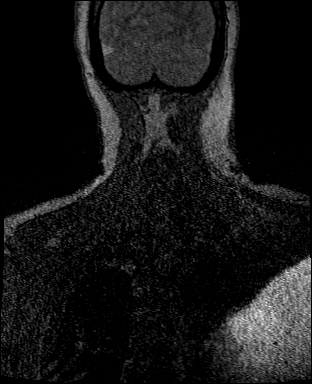

[Series 11: (id)_cor_post_sub · coronal · 0.8mm · 0.78mm/px · 8 of 96 slices shown]
[im 1/96]
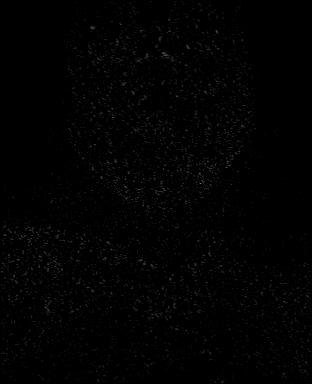
[im 14/96]
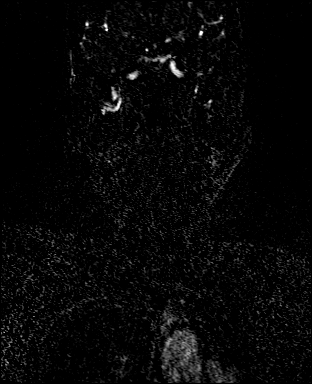
[im 28/96]
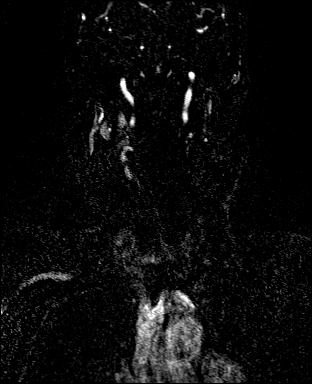
[im 41/96]
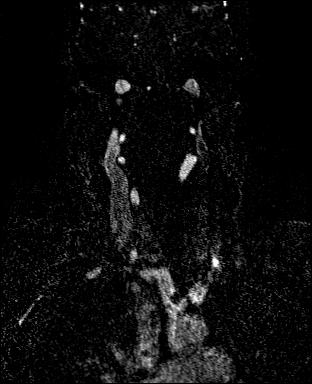
[im 55/96]
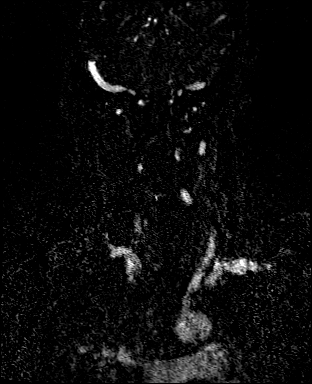
[im 68/96]
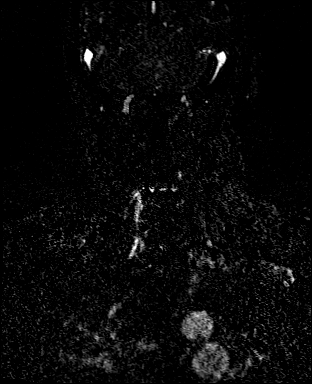
[im 82/96]
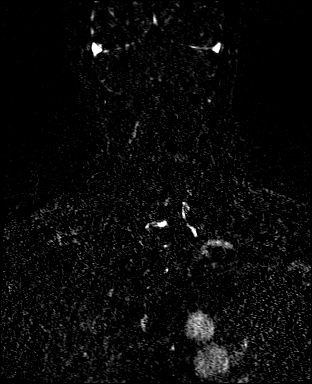
[im 96/96]
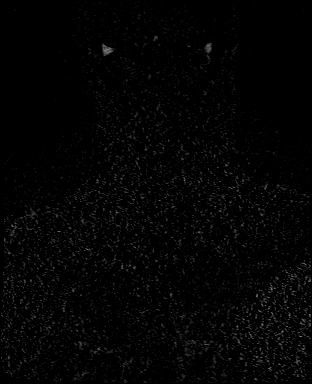

[28 of 48 positions shown; findings below may reference images not displayed]

FINDINGS: MRI HEAD FINDINGS

Brain: Cerebral volume within normal limits for patient age.
Scattered patchy T2/FLAIR hyperintensity within the periventricular,
deep, and subcortical white matter both cerebral hemispheres, most
consistent with chronic small vessel ischemic disease, mild for age.

No abnormal foci of restricted diffusion to suggest acute or
subacute ischemia. Gray-white matter differentiation maintained. No
encephalomalacia to suggest chronic cortical infarction. No evidence
for acute or chronic intracranial hemorrhage.

No mass lesion, midline shift or mass effect no hydrocephalus or
extra-axial fluid collection. Pituitary gland suprasellar region
normal. Midline structures intact.

Vascular: Major intracranial vascular flow voids are maintained.

Skull and upper cervical spine: Craniocervical junction within
normal limits. Bone marrow signal intensity normal. No scalp soft
tissue abnormality.

Sinuses/Orbits: Patient status post bilateral ocular lens
replacement. Paranasal sinuses are clear. No significant mastoid
effusion. Inner ear structures grossly normal.

Other: None.

MRA HEAD FINDINGS

ANTERIOR CIRCULATION:

Visualized distal cervical segments of the internal carotid arteries
are patent with antegrade flow. Petrous, cavernous, and supraclinoid
segments patent without stenosis or other abnormality. A1 segments
patent. Right A1 hypoplastic. Normal anterior communicating artery
complex. Anterior cerebral arteries patent to their distal aspects
without stenosis. Possible short-segment fenestration at the
proximal left A2 segment noted.

Right M1 widely patent. Normal right MCA bifurcation. Distal right
MCA branches well perfused.

Atheromatous irregularity with suspected mild short-segment stenosis
at the proximal left M1 segment (series 12, image 6). Normal left
MCA bifurcation. Distal left MCA branches well perfused.

POSTERIOR CIRCULATION:

Both V4 segments patent to the vertebrobasilar junction without
stenosis. Right vertebral artery dominant. Both PICA patent. Basilar
patent to its distal aspect without stenosis. Superior cerebellar
arteries patent bilaterally. Left PCA supplied via the basilar as
well as a small left posterior communicating artery. Fetal type
origin of the right PCA. Both PCAs perfused to their distal aspects
without stenosis.

No aneurysm.

MRA NECK FINDINGS

AORTIC ARCH: Visualized aortic arch normal in caliber with normal
branch pattern. No hemodynamically significant stenosis seen about
the origin of the great vessels.

RIGHT CAROTID SYSTEM: Visualized right CCA patent to the bifurcation
without stenosis. Atheromatous irregularity about the right carotid
bulb/proximal right ICA with associated short-segment stenosis of up
to 30% by NASCET criteria. Right ICA patent distally without
stenosis, evidence for dissection or occlusion.

LEFT CAROTID SYSTEM: Left CCA patent from its origin to the
bifurcation without stenosis. No significant atheromatous
irregularity or narrowing about the left bifurcation. Left ICA
patent distally without stenosis, evidence for dissection or
occlusion.

VERTEBRAL ARTERIES: Both vertebral arteries arise from subclavian
arteries. Right vertebral artery dominant. No visible proximal
subclavian artery stenosis on this motion degraded exam. Proximal
left vertebral artery somewhat limited assessment due to adjacent
venous contamination. Visualized portions of the vertebral arteries
patent without stenosis, evidence for dissection or occlusion.
IMPRESSION: MRI HEAD IMPRESSION:

1. No acute intracranial abnormality.
2. Mild chronic microvascular ischemic disease for age.

MRA HEAD IMPRESSION:

1. Negative intracranial MRA for large vessel occlusion.
2. Mild intracranial atherosclerotic change for age with associated
mild short-segment stenosis of the proximal left M1 segment. No
other hemodynamically significant or correctable stenosis.

MRA NECK IMPRESSION:

1. Short-segment mild stenosis of up to 30% by NASCET criteria
involving the origin/proximal right ICA.
2. No hemodynamically significant stenosis identified within the
left carotid artery system.
3. Patent vertebral arteries within the neck without stenosis. Right
vertebral artery dominant.

## 2021-04-22 MED ORDER — CYCLOSPORINE 0.05 % OP EMUL
1.0000 [drp] | Freq: Two times a day (BID) | OPHTHALMIC | Status: DC
  Administered 2021-04-22 – 2021-04-24 (×5): 1 [drp] via OPHTHALMIC
  Administered 2021-04-25: 2 [drp] via OPHTHALMIC
  Filled 2021-04-22 (×8): qty 1

## 2021-04-22 MED ORDER — SODIUM CHLORIDE 0.9 % IV BOLUS
1000.0000 mL | Freq: Once | INTRAVENOUS | Status: AC
  Administered 2021-04-22: 1000 mL via INTRAVENOUS

## 2021-04-22 MED ORDER — ACETAMINOPHEN 325 MG PO TABS: 650.0000 mg | ORAL_TABLET | ORAL | Status: DC | PRN

## 2021-04-22 MED ORDER — ALBUTEROL SULFATE (2.5 MG/3ML) 0.083% IN NEBU: 3.0000 mL | INHALATION_SOLUTION | Freq: Four times a day (QID) | RESPIRATORY_TRACT | Status: DC | PRN

## 2021-04-22 MED ORDER — SENNOSIDES-DOCUSATE SODIUM 8.6-50 MG PO TABS: 1.0000 | ORAL_TABLET | Freq: Every evening | ORAL | Status: DC | PRN

## 2021-04-22 MED ORDER — STROKE: EARLY STAGES OF RECOVERY BOOK
Freq: Once | Status: AC
  Filled 2021-04-22: qty 1

## 2021-04-22 MED ORDER — ACETAMINOPHEN 650 MG RE SUPP: 650.0000 mg | RECTAL | Status: DC | PRN

## 2021-04-22 MED ORDER — GADOBUTROL 1 MMOL/ML IV SOLN
8.0000 mL | Freq: Once | INTRAVENOUS | Status: AC | PRN
  Administered 2021-04-22: 8 mL via INTRAVENOUS

## 2021-04-22 MED ORDER — ACETAMINOPHEN 160 MG/5ML PO SOLN: 650.0000 mg | ORAL | Status: DC | PRN

## 2021-04-22 NOTE — ED Triage Notes (Signed)
Pt states she has been followed for low sodium at [facility] and her PCP, yesterday difficulty forming words, some dizziness with DOE

## 2021-04-22 NOTE — ED Notes (Signed)
Lab called to collect BNP.

## 2021-04-22 NOTE — H&P (Signed)
History and Physical    Jennifer Calhoun HEN:[id number] DOB: [birth year] DOA: 04/22/2021  PCP: Marva Panda, NP  Patient coming from: home  Chief Complaint: dizziness  HPI: Jennifer Calhoun is a 80 y.o. female with medical history significant of orthostatic hypotension, HLD. Presenting with dizziness. The patient has had dizziness for the last 2 weeks. She was seen in the ED 2 weeks ago and found to by hyponatremic. At the time she had also complained of some blood in her stool. No blood was noted in her stool at the time. She decided that she could follow up with her regular doctors on her own, so she declined admission. She checked in with her PCP a few days later and her sodium was a little lower. She states that she was drinking a lot of water and got the recommendation to decrease her intake. Her dizziness seemed to be a little better until yesterday. Her dizziness returned and she seemed to have difficulty forming her words. This continued through this morning. She became concerned and came to the ED for help. She denies any other aggravating or alleviating factors.   ED Course: Na+ was normal. Her BP was elevated. She had some expressive aphasia per EDP. CTH was negative for acute change. Neurology was consulted. TRH was called for admission.    Review of Systems:  Reports feet swelling. Review of systems is otherwise negative for all not mentioned in HPI.   PMHx Past Medical History:  Diagnosis Date  . History of small bowel obstruction    x3  . History of TIA (transient ischemic attack) 2003  . Hyperlipemia   . Seasonal allergies   . Wears glasses     PSHx Past Surgical History:  Procedure Laterality Date  . CATARACT EXTRACTION     both eyes  . CHOLECYSTECTOMY N/A 03/22/2015   Procedure: LAPAROSCOPIC CHOLECYSTECTOMY ;  Surgeon: Abigail Miyamoto, MD;  Location: [facility];  Service: General;  Laterality: N/A;  . COLONOSCOPY    . FEMORAL HERNIA REPAIR  2000    small bowel resection-abstruction  . TOTAL ABDOMINAL HYSTERECTOMY      SocHx  reports that she quit smoking about 49 years ago. She has never used smokeless tobacco. She reports current alcohol use. She reports that she does not use drugs.  Allergies  Allergen Reactions  . Penicillins     Has patient had a PCN reaction causing immediate rash, facial/tongue/throat swelling, SOB or lightheadedness with hypotension: NO Has patient had a PCN reaction causing severe rash involving mucus membranes or skin necrosis: NO Has patient had a PCN reaction that required hospitalizationNO Has patient had a PCN reaction occurring within the last 10 years: NO If all of the above answers are "NO", then may proceed with Cephalosporin use.  Dorena Dew Allergy Skin Test   . Cat Hair Extract   . Gramineae Pollens   . Latex Itching  . Shellfish Allergy     FamHx No family history on file.  Prior to Admission medications   Medication Sig Start Date End Date Taking? Authorizing Provider  albuterol (VENTOLIN HFA) 108 (90 Base) MCG/ACT inhaler Inhale 2 puffs into the lungs every 6 (six) hours as needed for wheezing or shortness of breath.   Yes [provider]  alendronate (FOSAMAX) 10 MG tablet Take 10 mg by mouth once a week. 05/27/20  Yes [provider]  loratadine (CLARITIN) 10 MG tablet Take 10 mg by mouth daily.   Yes  [provider]  RESTASIS 0.05 % ophthalmic emulsion Place 1 drop into both eyes 2 (two) times daily. 04/05/21  Yes [provider]  Vitamin D, Ergocalciferol, (DRISDOL) 1.25 MG (50000 UNIT) CAPS capsule Take 1 capsule by mouth once a week. 03/15/21  Yes [provider]  ibuprofen (ADVIL,MOTRIN) 800 MG tablet Take 1 tablet (800 mg total) by mouth 3 (three) times daily. Patient not taking: No sig reported 06/25/16   Margarita Grizzle, MD  predniSONE (DELTASONE) 10 MG tablet Take 2 tablets (20 mg total) by mouth daily. Patient not taking:  Reported on 04/22/2021 06/25/16   Margarita Grizzle, MD  atorvastatin (LIPITOR) 20 MG tablet Take 20 mg by mouth daily.  04/07/21  [provider]  furosemide (LASIX) 20 MG tablet Take 20 mg by mouth daily as needed. 01/10/20 04/07/21  [provider]    Physical Exam: Vitals:   04/22/21 1415 04/22/21 1452 04/22/21 1530 04/22/21 1600  BP: (!) 186/88 (!) 186/88 (!) 187/81 (!) 187/81  Pulse: 90 90 77 83  Resp: 18 17 17 20   Temp:      TempSrc:      SpO2: 100% 100% 100% 100%  Weight:      Height:        General: 80 y.o. female resting in bed in NAD Eyes: PERRL, normal sclera ENMT: Nares patent w/o discharge, orophaynx clear, dentition normal, ears w/o discharge/lesions/ulcers Neck: Supple, trachea midline Cardiovascular: RRR, +S1, S2, no g/r, 2/6 SEM equal pulses throughout Respiratory: CTABL, no w/r/r, normal WOB GI: BS+, NDNT, no masses noted, no organomegaly noted MSK: No e/c/c; TED hose noted Skin: No rashes, bruises, ulcerations noted Neuro: A&O x 3, no focal deficits Psyc: Appropriate interaction and affect, calm/cooperative  Labs on Admission: I have personally reviewed following labs and imaging studies  CBC: Recent Labs  Lab 04/22/21 1238  WBC 3.5*  HGB 11.0*  HCT 35.1*  MCV 96.4  PLT 224   Basic Metabolic Panel: Recent Labs  Lab 04/22/21 1238  NA 137  K 3.6  CL 107  CO2 23  GLUCOSE 88  BUN <5*  CREATININE 1.13*  CALCIUM 9.1   GFR: Estimated Creatinine Clearance: 40.9 mL/min (A) (by C-G formula based on SCr of 1.13 mg/dL (H)). Liver Function Tests: No results for input(s): AST, ALT, ALKPHOS, BILITOT, PROT, ALBUMIN in the last 168 hours. No results for input(s): LIPASE, AMYLASE in the last 168 hours. No results for input(s): AMMONIA in the last 168 hours. Coagulation Profile: No results for input(s): INR, PROTIME in the last 168 hours. Cardiac Enzymes: No results for input(s): CKTOTAL, CKMB, CKMBINDEX, TROPONINI in the last 168 hours. BNP  (last 3 results) No results for input(s): PROBNP in the last 8760 hours. HbA1C: No results for input(s): HGBA1C in the last 72 hours. CBG: Recent Labs  Lab 04/22/21 1216  GLUCAP 91   Lipid Profile: No results for input(s): CHOL, HDL, LDLCALC, TRIG, CHOLHDL, LDLDIRECT in the last 72 hours. Thyroid Function Tests: No results for input(s): TSH, T4TOTAL, FREET4, T3FREE, THYROIDAB in the last 72 hours. Anemia Panel: No results for input(s): VITAMINB12, FOLATE, FERRITIN, TIBC, IRON, RETICCTPCT in the last 72 hours. Urine analysis:    Component Value Date/Time   COLORURINE STRAW (A) 04/22/2021 1204   APPEARANCEUR CLEAR 04/22/2021 1204   LABSPEC 1.005 04/22/2021 1204   PHURINE 6.0 04/22/2021 1204   GLUCOSEU NEGATIVE 04/22/2021 1204   HGBUR NEGATIVE 04/22/2021 1204   BILIRUBINUR NEGATIVE 04/22/2021 1204   KETONESUR NEGATIVE 04/22/2021 1204  PROTEINUR NEGATIVE 04/22/2021 1204   UROBILINOGEN 0.2 05/15/2011 1907   NITRITE NEGATIVE 04/22/2021 1204   LEUKOCYTESUR LARGE (A) 04/22/2021 1204    Radiological Exams on Admission: CT Head Wo Contrast  Result Date: 04/22/2021 CLINICAL DATA:  Difficulty forming words and dizziness. EXAM: CT HEAD WITHOUT CONTRAST TECHNIQUE: Contiguous axial images were obtained from the base of the skull through the vertex without intravenous contrast. COMPARISON:  May 30, 2002 FINDINGS: Brain: No evidence of acute infarction, hemorrhage, hydrocephalus, extra-axial collection or mass lesion/mass effect. Mild brain parenchymal volume loss and deep white matter microangiopathy. Vascular: No hyperdense vessel or unexpected calcification. Skull: Normal. Negative for fracture or focal lesion. Sinuses/Orbits: No acute finding. Other: None. IMPRESSION: 1. No acute intracranial abnormality. 2. Mild brain parenchymal atrophy and chronic microvascular disease. Electronically Signed   By: Ted Mcalpine M.D.   On: 04/22/2021 13:23    EKG: Independently reviewed. Sinus, no  st elevations  Assessment/Plan Possible TIA vs CVA Expressive aphasia     - admit to tele, inpt @ [facility]     - neurology onboard, appreciate assistance     - MRI brain, MRA head/neck ordered     - check echo     - check lipids, A1c     - PT/OT/SLP     - permissive HTN for now  HTN urgency     - history of orthostatic hypotension; chronic use of TEDs     - no history of HTN     - SBPs 200s in room, but may be spurious reads as she is flexing her arms in pain w/ each BP read     - allowing permissive HTN for now anyway, will monitor  Mildly elevated BNP     - CXR is clear, checking Echo as part of TIA work up  Normocytic anemia     - check iron studies  DVT prophylaxis: SCDs  Code Status: FULL  Family Communication: w/ friend at bedside  Consults called: EDP consulted neurology  Status is: Inpatient  Remains inpatient appropriate because:Inpatient level of care appropriate due to severity of illness   Dispo: The patient is from: Home              Anticipated d/c is to: Home              Patient currently is not medically stable to d/c.   Difficult to place patient No  Teddy Spike DO [facility]  If 7PM-7AM, please contact night-coverage [email or web address]  04/22/2021, 4:08 PM

## 2021-04-22 NOTE — Progress Notes (Signed)
Received pt from [facility], via [facility], alert and oriented, MD orders implemented, call light in reach, all questions answered, MD paged regarding pt's arrival.

## 2021-04-22 NOTE — Consult Note (Signed)
NEURO HOSPITALIST CONSULT NOTE   Requestig physician: Dr. Ronaldo Miyamoto  Reason for Consult: Difficulty forming words  History obtained from:   Patient and Chart     HPI:                                                                                                                                          Jennifer Calhoun is an 80 y.o. female with a PMHx of orthostatic hypotension, SBO x 3, TIA, HLD and seasonal allergies who presented to the [facility] ED this afternoon after onset of intermittent symptoms of difficulty forming words, dizziness/lightheadedness and dyspnea on exertion. Her difficulty speaking occurred yesterday and has continued intermittently. She describes this as "difficulty getting the words out". Other symptoms for the past few days to weeks. Of note, she has been followed at [facility] and her PCP for low sodium levels. She has experienced dizziness and SOB when trying to stand up. She was noted to have a sodium level of 126 15 days ago when seen at [facility]; level at follow up was lower at 123. Has had some blood in her stool, with guaiac positive previously. Has had persistent sense of thirst and also feels she is dehydrated.   CT head in the ED today showed no acute intracranial abnormality; findings of mild brain parenchymal atrophy and chronic microvascular disease were noted.   SBP was elevated in the ED in the 200's. Na was normal. Orthostatics were negative. Per EDP assessment, her speech fluctuated from fluent to having a stuttering quality; neurological exam by EDP was otherwise unremarkable.     Past Medical History:  Diagnosis Date  . History of small bowel obstruction    x3  . History of TIA (transient ischemic attack) 2003  . Hyperlipemia   . Seasonal allergies   . Wears glasses     Past Surgical History:  Procedure Laterality Date  . CATARACT EXTRACTION     both eyes  . CHOLECYSTECTOMY N/A 03/22/2015   Procedure: LAPAROSCOPIC CHOLECYSTECTOMY  ;  Surgeon: Abigail Miyamoto, MD;  Location: [facility];  Service: General;  Laterality: N/A;  . COLONOSCOPY    . FEMORAL HERNIA REPAIR  2000   small bowel resection-abstruction  . TOTAL ABDOMINAL HYSTERECTOMY      No family history on file.           Social History:  reports that she quit smoking about 49 years ago. She has never used smokeless tobacco. She reports current alcohol use. She reports that she does not use drugs.  Allergies  Allergen Reactions  . Penicillins     Has patient had a PCN reaction causing immediate rash, facial/tongue/throat swelling, SOB or lightheadedness with hypotension: NO Has patient had a PCN reaction causing severe rash involving  mucus membranes or skin necrosis: NO Has patient had a PCN reaction that required hospitalizationNO Has patient had a PCN reaction occurring within the last 10 years: NO If all of the above answers are "NO", then may proceed with Cephalosporin use.  Dorena Dew Allergy Skin Test   . Cat Hair Extract   . Gramineae Pollens   . Latex Itching  . Shellfish Allergy     MEDICATIONS:                                                                                                                     Prior to Admission:  Medications Prior to Admission  Medication Sig Dispense Refill Last Dose  . albuterol (VENTOLIN HFA) 108 (90 Base) MCG/ACT inhaler Inhale 2 puffs into the lungs every 6 (six) hours as needed for wheezing or shortness of breath.   04/22/2021 at Unknown time  . alendronate (FOSAMAX) 10 MG tablet Take 10 mg by mouth once a week.   04/19/2021 at unknown time  . loratadine (CLARITIN) 10 MG tablet Take 10 mg by mouth daily.   04/21/2021 at Unknown time  . RESTASIS 0.05 % ophthalmic emulsion Place 1 drop into both eyes 2 (two) times daily.   04/22/2021 at Unknown time  . Vitamin D, Ergocalciferol, (DRISDOL) 1.25 MG (50000 UNIT) CAPS capsule Take 1 capsule by mouth once a week.   Past Week at Unknown time  .  ibuprofen (ADVIL,MOTRIN) 800 MG tablet Take 1 tablet (800 mg total) by mouth 3 (three) times daily. (Patient not taking: No sig reported) 21 tablet 0 Completed Course at Unknown time  . predniSONE (DELTASONE) 10 MG tablet Take 2 tablets (20 mg total) by mouth daily. (Patient not taking: Reported on 04/22/2021) 5 tablet 0 Completed Course at Unknown time   Scheduled: . cycloSPORINE  1 drop Both Eyes BID     ROS:                                                                                                                                       As per HPI.    Blood pressure (!) 148/99, pulse 79, temperature 98.2 F (36.8 C), temperature source Oral, resp. rate 20, height 5\' 3"  (1.6 m), weight 79.4 kg, SpO2 100 %.   General Examination:  Physical Exam  HEENT-  [place]/AT    Lungs- Respirations unlabored Extremities- Warm and well perfused  Neurological Examination Mental Status: Awake, alert and oriented. Two brief episodes of hesitant, stuttering speech lasting < 5 seconds are noted. Speech is otherwise fluent. Comprehension and naming intact. Able to follow all commands without difficulty. Cranial Nerves: II: Temporal visual fields intact without extinction to DSS. PERRL.  III,IV, VI: No ptosis. EOMI. No nystagmus.  V,VII: Face symmetric, facial temp sensation equal bilaterally VIII: hearing intact to voice IX,X: No hypophonia XI: Symmetric XII: Midline tongue  Motor: Right : Upper extremity   5/5    Left:     Upper extremity   5/5  Lower extremity   5/5     Lower extremity   5/5 Sensory: Mildly decreased temp sensation to LUE and LLE.  Deep Tendon Reflexes: 1+ and symmetric brachioradialis and biceps. Unable to elicit patellar reflexes Cerebellar: No ataxia with FNF bilaterally. Fine action tremor noted bilaterally.  Gait: Deferred   Lab Results: Basic Metabolic Panel: Recent Labs   Lab 04/22/21 1238  NA 137  K 3.6  CL 107  CO2 23  GLUCOSE 88  BUN <5*  CREATININE 1.13*  CALCIUM 9.1    CBC: Recent Labs  Lab 04/22/21 1238  WBC 3.5*  HGB 11.0*  HCT 35.1*  MCV 96.4  PLT 224    Cardiac Enzymes: No results for input(s): CKTOTAL, CKMB, CKMBINDEX, TROPONINI in the last 168 hours.  Lipid Panel: No results for input(s): CHOL, TRIG, HDL, CHOLHDL, VLDL, LDLCALC in the last 168 hours.  Imaging: CT Head Wo Contrast  Result Date: 04/22/2021 CLINICAL DATA:  Difficulty forming words and dizziness. EXAM: CT HEAD WITHOUT CONTRAST TECHNIQUE: Contiguous axial images were obtained from the base of the skull through the vertex without intravenous contrast. COMPARISON:  May 30, 2002 FINDINGS: Brain: No evidence of acute infarction, hemorrhage, hydrocephalus, extra-axial collection or mass lesion/mass effect. Mild brain parenchymal volume loss and deep white matter microangiopathy. Vascular: No hyperdense vessel or unexpected calcification. Skull: Normal. Negative for fracture or focal lesion. Sinuses/Orbits: No acute finding. Other: None. IMPRESSION: 1. No acute intracranial abnormality. 2. Mild brain parenchymal atrophy and chronic microvascular disease. Electronically Signed   By: Ted Mcalpineobrinka  Dimitrova M.D.   On: 04/22/2021 13:23   DG CHEST PORT 1 VIEW  Result Date: 04/22/2021 CLINICAL DATA:  Shortness of breath and dizziness EXAM: PORTABLE CHEST 1 VIEW COMPARISON:  06/25/2016 FINDINGS: Cardiac shadow is mildly prominent but stable. Aortic calcifications are again seen. Calcifications are noted in the breasts bilaterally stable in appearance from prior exam and shown to be within the breasts on prior CT. No bony abnormality is seen. No focal infiltrate is noted. IMPRESSION: No active disease. Electronically Signed   By: Alcide CleverMark  Lukens M.D.   On: 04/22/2021 17:29    Assessment: 80 year old female with intermittent spells of stuttering speech.  1. During exam two brief episodes of  hesitant, stuttering speech lasting < 5 seconds were noted. Mild action tremor also seen.   2. CT head: Mild brain parenchymal atrophy and chronic microvascular disease.  3. MRI brain: Mild chronic microvascular ischemic disease for age. No acute intracranial abnormality.  4. MRA head: Mild intracranial atherosclerotic change for age with associated mild short-segment stenosis of the proximal left M1 segment.  5. MRA neck: Short-segment mild stenosis of up to 30% by NASCET criteria involving the origin/proximal right ICA. No hemodynamically significant stenosis identified within the left carotid artery system. Patent vertebral  arteries within the neck without stenosis. Right vertebral artery dominant. 6. DDx for presentation includes TIA, seizure and psychogenic speech alteration.   Recommendations: 1. EEG (ordered) 2. TTE 3. ASA 81 mg po qd 4. Cardiac telemetry 5. PT/OT/Speech 6. Statin. Baseline CK level has been ordered.  7. Permissive HTN x 24 hours.   Electronically signed: Dr. Caryl Pina 04/22/2021, 7:30 PM

## 2021-04-22 NOTE — ED Provider Notes (Signed)
[facility]   CSN: [id number] Arrival date & time: 04/22/21  1143     History Chief Complaint  Patient presents with  . Dizziness  . Aphasia    Jennifer Calhoun is a 80 y.o. female.  HPI Patient presents with dizziness and lightheadedness.'s been going for the last few days.  States she feels dizzy and shortness of breath if she attempts to stand up.  Has been seen at [facility] 15 days ago and found to be hyponatremic at 126.  After that states she was drinking more water and sodium went down to 123 with follow-up.  States she had had some blood in the stool prior to that and was guaiac positive but had cleared up.  States she thinks she is dehydrated but did not get any IV fluids while [facility] because had difficulty getting an IV.  No headache.  Patient states that she is thirsty all the time now.  Also states that she had some difficulty speaking.  That was yesterday.  Comes and goes.  States it is just more difficulty getting the words out.  Has not really had issues like that previously but there is a note from the driver to visit talk about difficulty speaking.    Past Medical History:  Diagnosis Date  . History of small bowel obstruction    x3  . History of TIA (transient ischemic attack) 2003  . Hyperlipemia   . Seasonal allergies   . Wears glasses     There are no problems to display for this patient.   Past Surgical History:  Procedure Laterality Date  . CATARACT EXTRACTION     both eyes  . CHOLECYSTECTOMY N/A 03/22/2015   Procedure: LAPAROSCOPIC CHOLECYSTECTOMY ;  Surgeon: Abigail Miyamoto, MD;  Location: [facility];  Service: General;  Laterality: N/A;  . COLONOSCOPY    . FEMORAL HERNIA REPAIR  2000   small bowel resection-abstruction  . TOTAL ABDOMINAL HYSTERECTOMY       OB History   No obstetric history on file.     No family history on file.  Social History   Tobacco Use  .  Smoking status: Former Smoker    Quit date: 03/20/1972    Years since quitting: 49.1  . Smokeless tobacco: Never Used  Vaping Use  . Vaping Use: Never used  Substance Use Topics  . Alcohol use: Yes    Comment: glass wine nightly  . Drug use: No    Home Medications Prior to Admission medications   Medication Sig Start Date End Date Taking? Authorizing Provider  albuterol (VENTOLIN HFA) 108 (90 Base) MCG/ACT inhaler Inhale 2 puffs into the lungs every 6 (six) hours as needed for wheezing or shortness of breath.   Yes [provider]  alendronate (FOSAMAX) 10 MG tablet Take 10 mg by mouth once a week. 05/27/20  Yes [provider]  loratadine (CLARITIN) 10 MG tablet Take 10 mg by mouth daily.   Yes [provider]  RESTASIS 0.05 % ophthalmic emulsion Place 1 drop into both eyes 2 (two) times daily. 04/05/21  Yes [provider]  Vitamin D, Ergocalciferol, (DRISDOL) 1.25 MG (50000 UNIT) CAPS capsule Take 1 capsule by mouth once a week. 03/15/21  Yes [provider]  ibuprofen (ADVIL,MOTRIN) 800 MG tablet Take 1 tablet (800 mg total) by mouth 3 (three) times daily. Patient not taking: No sig reported 06/25/16   Margarita Grizzle, MD  predniSONE (  DELTASONE) 10 MG tablet Take 2 tablets (20 mg total) by mouth daily. Patient not taking: Reported on 04/22/2021 06/25/16   Margarita Grizzle, MD  atorvastatin (LIPITOR) 20 MG tablet Take 20 mg by mouth daily.  04/07/21  [provider]  furosemide (LASIX) 20 MG tablet Take 20 mg by mouth daily as needed. 01/10/20 04/07/21  [provider]    Allergies    Penicillins, [place] cypress allergy skin test, Cat hair extract, Gramineae pollens, Latex, and Shellfish allergy  Review of Systems   Review of Systems  Constitutional: Negative for appetite change.  HENT: Negative for congestion.   Respiratory: Negative for shortness of breath.   Gastrointestinal: Negative for abdominal pain and blood in stool.   Musculoskeletal: Negative for back pain.  Skin: Negative for rash.  Neurological: Positive for speech difficulty and light-headedness.  Psychiatric/Behavioral: Negative for confusion.    Physical Exam Updated Vital Signs BP (!) 187/81   Pulse 77   Temp 98.1 F (36.7 C) (Oral)   Resp 17   Ht 5\' 3"  (1.6 m)   Wt 79.4 kg   SpO2 100%   BMI 31.00 kg/m   Physical Exam Vitals and nursing note reviewed.  HENT:     Head: Atraumatic.  Eyes:     Pupils: Pupils are equal, round, and reactive to light.  Cardiovascular:     Rate and Rhythm: Regular rhythm.  Pulmonary:     Effort: Pulmonary effort is normal.     Breath sounds: Normal breath sounds.  Abdominal:     Tenderness: There is no abdominal tenderness.  Musculoskeletal:        General: No tenderness.     Cervical back: Neck supple.  Skin:    General: Skin is warm.  Neurological:     Mental Status: She is alert.     Comments: Patient speech will both be at times fluent and normal but other times almost stuttering to it.  States this is not normal for her.  Eye movements intact.  Face symmetric.  Awake and appropriate.  Moving all extremities.     ED Results / Procedures / Treatments   Labs (all labs ordered are listed, but only abnormal results are displayed) Labs Reviewed  BASIC METABOLIC PANEL - Abnormal; Notable for the following components:      Result Value   BUN <5 (*)    Creatinine, Ser 1.13 (*)    GFR, Estimated 50 (*)    All other components within normal limits  CBC - Abnormal; Notable for the following components:   WBC 3.5 (*)    RBC 3.64 (*)    Hemoglobin 11.0 (*)    HCT 35.1 (*)    All other components within normal limits  URINALYSIS, ROUTINE W REFLEX MICROSCOPIC - Abnormal; Notable for the following components:   Color, Urine STRAW (*)    Leukocytes,Ua LARGE (*)    Bacteria, UA RARE (*)    All other components within normal limits  CBG MONITORING, ED  SAMPLE TO BLOOD BANK    EKG EKG  Interpretation  Date/Time:  Monday Apr 22 2021 12:15:07 EDT Ventricular Rate:  95 PR Interval:  172 QRS Duration: 88 QT Interval:  375 QTC Calculation: 472 R Axis:   45 Text Interpretation: sinus rhythm Confirmed by [id number] [phone]) on 04/22/2021 12:30:57 PM   Radiology CT Head Wo Contrast  Result Date: 04/22/2021 CLINICAL DATA:  Difficulty forming words and dizziness. EXAM: CT HEAD WITHOUT CONTRAST TECHNIQUE: Contiguous axial images  were obtained from the base of the skull through the vertex without intravenous contrast. COMPARISON:  May 30, 2002 FINDINGS: Brain: No evidence of acute infarction, hemorrhage, hydrocephalus, extra-axial collection or mass lesion/mass effect. Mild brain parenchymal volume loss and deep white matter microangiopathy. Vascular: No hyperdense vessel or unexpected calcification. Skull: Normal. Negative for fracture or focal lesion. Sinuses/Orbits: No acute finding. Other: None. IMPRESSION: 1. No acute intracranial abnormality. 2. Mild brain parenchymal atrophy and chronic microvascular disease. Electronically Signed   By: Ted Mcalpine M.D.   On: 04/22/2021 13:23    Procedures Procedures   Medications Ordered in ED Medications  sodium chloride 0.9 % bolus 1,000 mL (1,000 mLs Intravenous Bolus 04/22/21 1341)    ED Course  I have reviewed the triage vital signs and the nursing notes.  Pertinent labs & imaging results that were available during my care of the patient were reviewed by me and considered in my medical decision making (see chart for details).    MDM Rules/Calculators/A&P                          Patient presents with few different complaints.  Dizziness lightheadedness.  Speech difficulty.  Comes and go somewhat.  States the dizziness is worse with standing.  States she does feel short of breath 2.  Recently had hyponatremia.  It has improved today however.  Head CT done and reassuring.  However even talking to me at times she would  have difficulty getting words out almost with stuttering which is not usual for her.  Hemoglobin is 10.0.  Recently at 11.3.  Has had recent guaiac positive although states no more bleeding.  However patient is hypertensive.  Was not orthostatic.  With the dizziness shortness of breath I feels the patient would benefit from mission to the hospital particularly with the difficulty speaking.  Not consistently having any deficits that do not think a candidate for tPA or any stroke intervention.  Will admit to hospitalist Final Clinical Impression(s) / ED Diagnoses Final diagnoses:  Dizziness  Anemia, unspecified type  Aphasia    Rx / DC Orders ED Discharge Orders    None       Benjiman Core, MD 04/22/21 1550

## 2021-04-23 ENCOUNTER — Inpatient Hospital Stay (HOSPITAL_COMMUNITY): Payer: Medicare HMO

## 2021-04-23 DIAGNOSIS — R479 Unspecified speech disturbances: Secondary | ICD-10-CM | POA: Diagnosis not present

## 2021-04-23 DIAGNOSIS — I10 Essential (primary) hypertension: Secondary | ICD-10-CM | POA: Diagnosis not present

## 2021-04-23 DIAGNOSIS — G459 Transient cerebral ischemic attack, unspecified: Secondary | ICD-10-CM

## 2021-04-23 DIAGNOSIS — R4701 Aphasia: Secondary | ICD-10-CM | POA: Diagnosis not present

## 2021-04-23 LAB — HEMOGLOBIN A1C
Hgb A1c MFr Bld: 5.7 % — ABNORMAL HIGH (ref 4.8–5.6)
Mean Plasma Glucose: 117 mg/dL

## 2021-04-23 LAB — ECHOCARDIOGRAM COMPLETE
AR max vel: 1.39 cm2
AV Area VTI: 1.4 cm2
AV Area mean vel: 1.36 cm2
AV Mean grad: 10 mmHg
AV Peak grad: 17.3 mmHg
Ao pk vel: 2.08 m/s
Area-P 1/2: 4.6 cm2
Height: 63 in
S' Lateral: 2.9 cm
Weight: 2800 oz

## 2021-04-23 LAB — CK: Total CK: 73 U/L (ref 38–234)

## 2021-04-23 LAB — LIPID PANEL
Cholesterol: 136 mg/dL (ref 0–200)
HDL: 50 mg/dL (ref >40)
LDL Cholesterol: 76 mg/dL (ref 0–99)
Total CHOL/HDL Ratio: 2.7 RATIO
Triglycerides: 51 mg/dL (ref <150)
VLDL: 10 mg/dL (ref 0–40)

## 2021-04-23 IMAGING — CT CT ANGIO HEAD-NECK (W OR W/O PERF)
1 of 11 series · 14 of 47 positions shown · IV contrast (omnipaque)
Comparison: CT head [DATE].  MRI head [DATE]

CLINICAL DATA: Stroke follow-up

EXAM:
CT ANGIOGRAPHY HEAD AND NECK
TECHNIQUE: Multidetector CT imaging of the head and neck was performed using
the standard protocol during bolus administration of intravenous
contrast. Multiplanar CT image reconstructions and MIPs were
obtained to evaluate the vascular anatomy. Carotid stenosis
measurements (when applicable) are obtained utilizing NASCET
criteria, using the distal internal carotid diameter as the
denominator.
CONTRAST:  75mL OMNIPAQUE IOHEXOL 350 MG/ML SOLN

[Series 16: thin (person_name) · axial · 0.46mm/px · z∈[-305,+11]mm · 14 of 729 slices shown]
[im 49/729  brain]
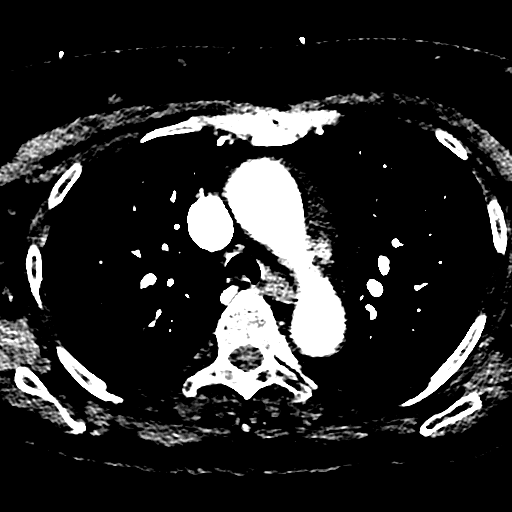
[im 98/729  bone]
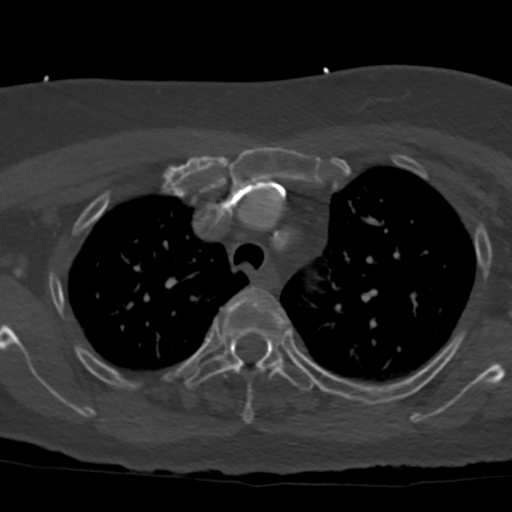
[im 146/729  brain]
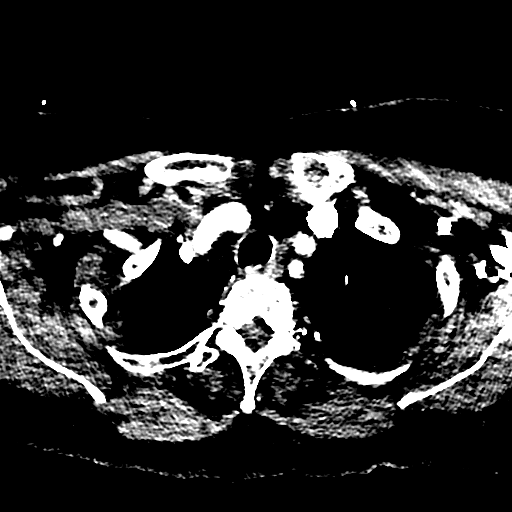
[im 195/729  bone]
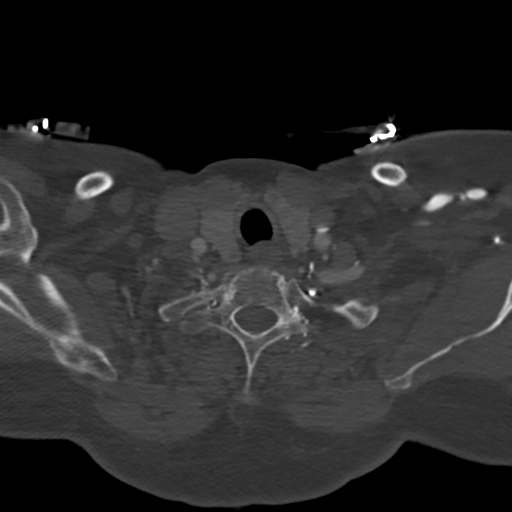
[im 243/729  brain]
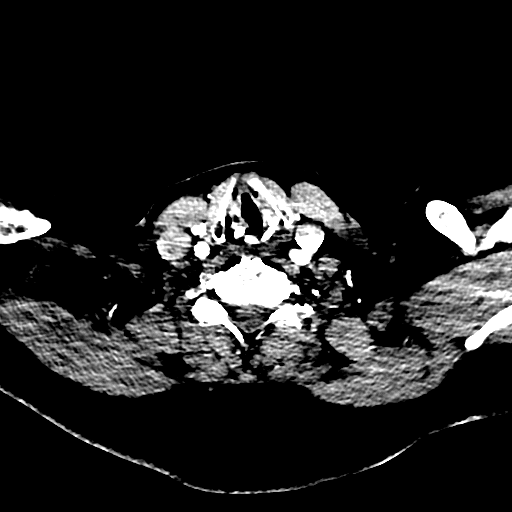
[im 292/729  bone]
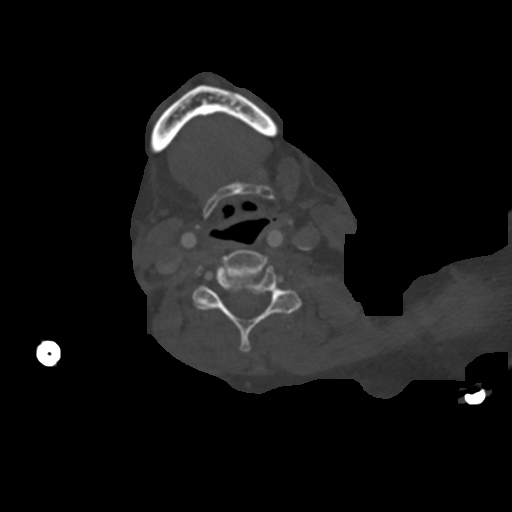
[im 340/729  brain]
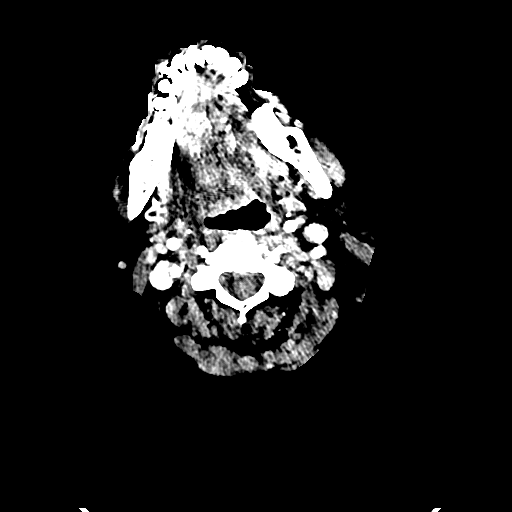
[im 389/729  bone]
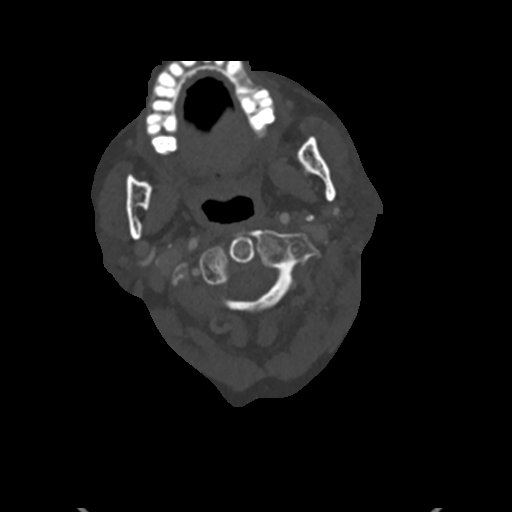
[im 437/729  brain]
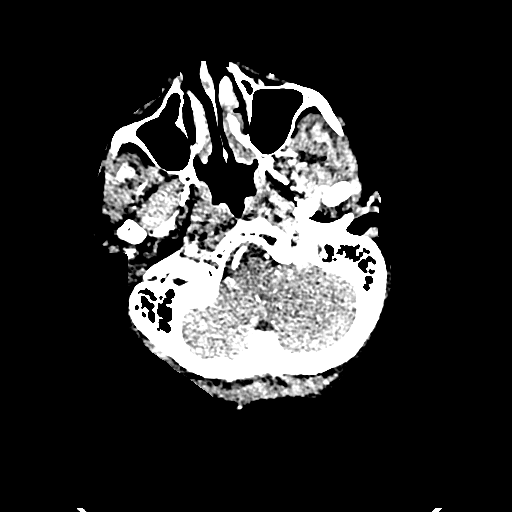
[im 486/729  bone]
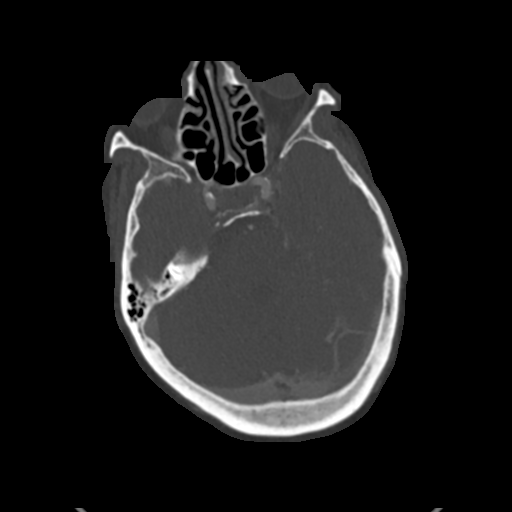
[im 534/729  brain]
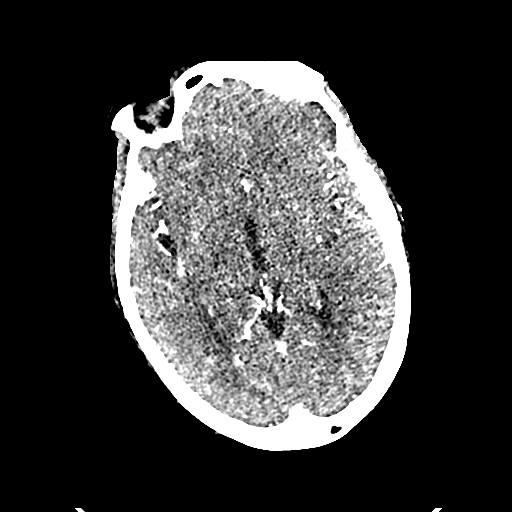
[im 583/729  bone]
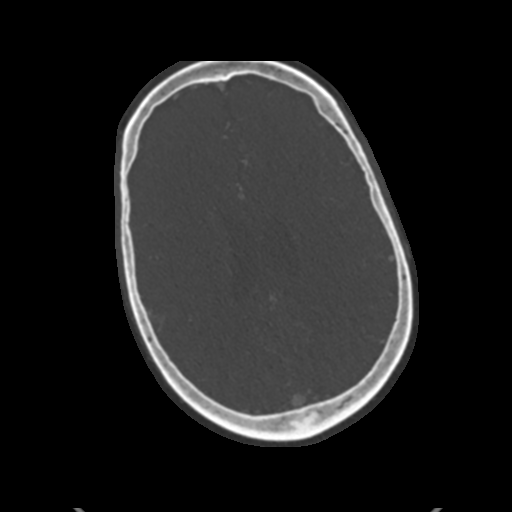
[im 631/729  brain]
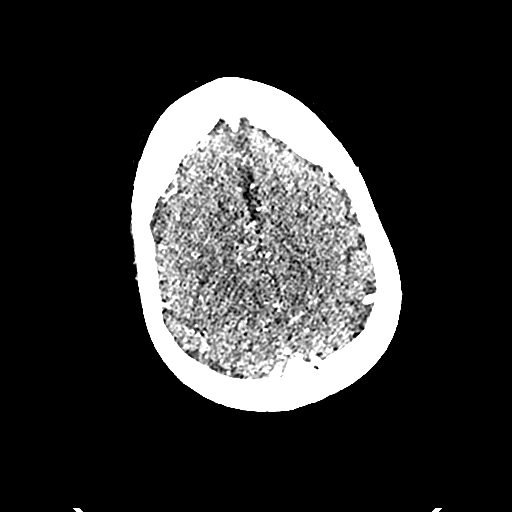
[im 680/729  bone]
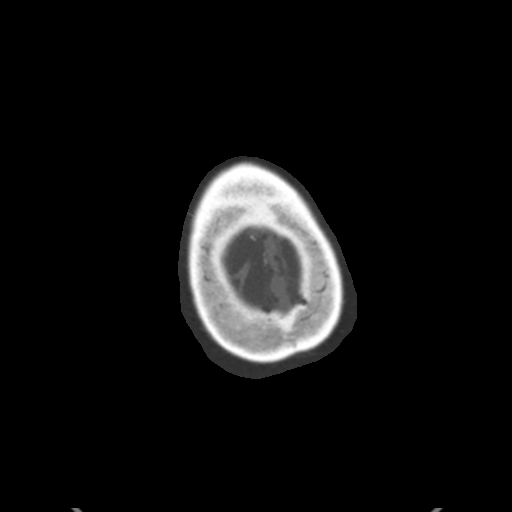

[14 of 47 positions shown; findings below may reference images not displayed]

FINDINGS: CT HEAD FINDINGS

Brain: No evidence of acute infarction, hemorrhage, hydrocephalus,
extra-axial collection or mass lesion/mass effect. Mild patchy white
matter hypodensity bilaterally.

Vascular: Negative for hyperdense vessel

Skull: Negative

Sinuses: Paranasal sinuses clear.

Orbits: Bilateral cataract extraction

Review of the MIP images confirms the above findings

CTA NECK FINDINGS

Aortic arch: Atherosclerotic aortic arch. Proximal great vessels
widely patent without stenosis. Bovine branching arch.

Right carotid system: Noncalcified plaque right carotid bulb with
less than 25% diameter stenosis of the right internal carotid
artery. Right external carotid artery widely patent.

Left carotid system: Widely patent bifurcation. Negative for
stenosis.

Vertebral arteries: Right vertebral artery dominant. Both vertebral
arteries patent to the basilar without significant stenosis.

Skeleton: Cervical spondylosis.  No acute skeletal abnormality.

Other neck: Negative for soft tissue mass or edema. Subcentimeter
thyroid nodules bilaterally. No further imaging necessary.

Upper chest: Lung apices clear bilaterally.

Review of the MIP images confirms the above findings

CTA HEAD FINDINGS

Anterior circulation: Mild atherosclerotic calcification in the
cavernous carotid bilaterally without stenosis. Anterior and middle
cerebral arteries patent bilaterally without stenosis or large
vessel occlusion.

Posterior circulation: Both vertebral arteries patent to the
basilar. Hypoplastic distal left vertebral artery. PICA patent
bilaterally. Atherosclerotic disease in the basilar with mild
stenosis. Anterior and middle cerebral arteries patent bilaterally
without stenosis. Fetal origin right posterior cerebral artery.

Venous sinuses: Normal venous enhancement

Anatomic variants: None

Review of the MIP images confirms the above findings
IMPRESSION: 1. No significant intracranial stenosis or large vessel occlusion.
2. No significant carotid or vertebral artery stenosis in the neck.
3. CT head negative for acute abnormality.

## 2021-04-23 MED ORDER — ATORVASTATIN CALCIUM 10 MG PO TABS
20.0000 mg | ORAL_TABLET | Freq: Every day | ORAL | Status: DC
  Administered 2021-04-23 – 2021-04-25 (×3): 20 mg via ORAL
  Filled 2021-04-23 (×3): qty 2

## 2021-04-23 MED ORDER — IOHEXOL 350 MG/ML SOLN
75.0000 mL | Freq: Once | INTRAVENOUS | Status: AC | PRN
  Administered 2021-04-23: 75 mL via INTRAVENOUS

## 2021-04-23 MED ORDER — ASPIRIN EC 325 MG PO TBEC
325.0000 mg | DELAYED_RELEASE_TABLET | Freq: Every day | ORAL | Status: DC
  Administered 2021-04-23 – 2021-04-25 (×3): 325 mg via ORAL
  Filled 2021-04-23 (×3): qty 1

## 2021-04-23 MED ORDER — LEVETIRACETAM 500 MG PO TABS
500.0000 mg | ORAL_TABLET | Freq: Two times a day (BID) | ORAL | Status: DC
  Administered 2021-04-23: 500 mg via ORAL
  Filled 2021-04-23: qty 1

## 2021-04-23 NOTE — Evaluation (Signed)
Physical Therapy Evaluation Patient Details Name: Jennifer Calhoun MRN: [id number] DOB: 1941/05/30 Today's Date: 04/23/2021   History of Present Illness  Pt is 80 yo female who presented on 5/30 with 2 wk h/o dizziness and some speech difficulties. CT head neg for acute event.  Had been in ED 2 wks prior also with dizziness as well as hyponatremia and blood in stool.  PMH: orthostatic hypotension, HLD, SBO x3, TIA.  Clinical Impression  Pt admitted with above diagnosis. Pt supervision level on eval with decreased gait speed and increased reaction times. Pt reports that activity level has been declining due to intermittent dizziness and she has stopped going to [employer]. She notes fatigue in quads and knees with increased walking distances. Recommend HHPT to help her return to level that she can get back to community based exercise.  Pt currently with functional limitations due to the deficits listed below (see PT Problem List). Pt will benefit from skilled PT to increase their independence and safety with mobility to allow discharge to the venue listed below.       Follow Up Recommendations Home health PT    Equipment Recommendations  None recommended by PT    Recommendations for Other Services       Precautions / Restrictions Precautions Precautions: Fall Precaution Comments: intermittent dizziness, no falls at home Restrictions Weight Bearing Restrictions: No      Mobility  Bed Mobility Overal bed mobility: Modified Independent             General bed mobility comments: Pt EOB upon arrival    Transfers Overall transfer level: Needs assistance Equipment used: None Transfers: Sit to/from Stand Sit to Stand: Supervision         General transfer comment: pt stands quickly. Discussed [place] precautions including moving UE/LE before standing and then in place with standing before ambulation  Ambulation/Gait Ambulation/Gait assistance: Min guard Gait Distance  (Feet): 200 Feet Assistive device: None Gait Pattern/deviations: Step-through pattern;Decreased stride length Gait velocity: decreased Gait velocity interpretation: 1.31 - 2.62 ft/sec, indicative of limited community ambulator General Gait Details: slow, guarded gait but pattern WFL  Stairs Stairs: Yes Stairs assistance: Supervision Stair Management: Forwards;Two rails;Alternating pattern Number of Stairs: 5 General stair comments: no difficulties with use of rails  Wheelchair Mobility    Modified Rankin (Stroke Patients Only) Modified Rankin (Stroke Patients Only) Pre-Morbid Rankin Score: No significant disability Modified Rankin: Moderate disability     Balance Overall balance assessment: Needs assistance Sitting-balance support: Feet supported Sitting balance-Leahy Scale: Good     Standing balance support: During functional activity;No upper extremity supported Standing balance-Leahy Scale: Good Standing balance comment: noted increased reaction time, reviwed SL stance and reaching activities for balance               High Level Balance Comments: Pt completed tub transfer with Min Guard for safety.             Pertinent Vitals/Pain Pain Assessment: No/denies pain    Home Living Family/patient expects to be discharged to:: Private residence Living Arrangements: Alone Available Help at Discharge: Friend(s);Family Type of Home: House Home Access: Level entry     Home Layout: One level Home Equipment: Other (comment) (safety button) Additional Comments: retired Public house manager. Has 2 "daughters" who watch out for her    Prior Function Level of Independence: Independent         Comments: Pt reports taking care of herself and her home independently     Hand Dominance  Extremity/Trunk Assessment   Upper Extremity Assessment Upper Extremity Assessment: Defer to OT evaluation    Lower Extremity Assessment Lower Extremity Assessment: RLE  deficits/detail RLE Deficits / Details: tests WFL on MMT but pt has been noting fatigue B quads/ knees as she has been steadily decreasing activity recently with intermittent dizziness. Did participate in silver sneakers until last few months RLE Sensation: WNL RLE Coordination: WNL    Cervical / Trunk Assessment Cervical / Trunk Assessment: Normal  Communication   Communication: No difficulties  Cognition Arousal/Alertness: Awake/alert Behavior During Therapy: WFL for tasks assessed/performed Overall Cognitive Status: Within Functional Limits for tasks assessed                                 General Comments: Pt conversational and appropriate throughout session.Pt higher level cognition tested. Pt recalled 3/3 words on memory recall test. Pt correctly answered simple math word problem.      General Comments General comments (skin integrity, edema, etc.): VSS    Exercises     Assessment/Plan    PT Assessment Patient needs continued PT services  PT Problem List Decreased activity tolerance;Decreased balance       PT Treatment Interventions Gait training;Functional mobility training;Therapeutic activities;Therapeutic exercise;Balance training;Patient/family education    PT Goals (Current goals can be found in the Care Plan section)  Acute Rehab PT Goals Patient Stated Goal: To go home today PT Goal Formulation: With patient Time For Goal Achievement: 05/07/21 Potential to Achieve Goals: Good    Frequency Min 3X/week   Barriers to discharge        Co-evaluation               AM-PAC PT "6 Clicks" Mobility  Outcome Measure Help needed turning from your back to your side while in a flat bed without using bedrails?: None Help needed moving from lying on your back to sitting on the side of a flat bed without using bedrails?: None Help needed moving to and from a bed to a chair (including a wheelchair)?: None Help needed standing up from a chair using  your arms (e.g., wheelchair or bedside chair)?: A Little Help needed to walk in hospital room?: A Little Help needed climbing 3-5 steps with a railing? : A Little 6 Click Score: 21    End of Session Equipment Utilized During Treatment: Gait belt Activity Tolerance: Patient tolerated treatment well Patient left: in bed;with call bell/phone within reach Nurse Communication: Mobility status PT Visit Diagnosis: Other abnormalities of gait and mobility (R26.89);Dizziness and giddiness (R42)    Time: 1610-9604 PT Time Calculation (min) (ACUTE ONLY): 20 min   Charges:   PT Evaluation $PT Eval Moderate Complexity: 1 Mod          Lyanne Co, PT  Acute Rehab Services  Pager [phone] Office [phone]   Lawana Chambers Bernadine Melecio 04/23/2021, 12:37 PM

## 2021-04-23 NOTE — Progress Notes (Signed)
Patient educated on SCD's and purpose, patient still will not like to use them when she is ambulating around the room. She agreed to wear them at bedtime or when she is in bed for long periods at a time. MD notified as well.

## 2021-04-23 NOTE — Procedures (Signed)
Patient Name: Jennifer Calhoun  MRN: [id number]  Epilepsy Attending: Charlsie Quest  Referring Physician/Provider: Dr Caryl Pina Date: 04/23/2021 Duration: 22.40 mins  Patient history: 80 year old female with intermittent spells of stuttering speech. EEG to evaluate for seizure.   Level of alertness: Awake, asleep  AEDs during EEG study: LEV  Technical aspects: This EEG study was done with scalp electrodes positioned according to the 10-20 International system of electrode placement. Electrical activity was acquired at a sampling rate of 500Hz  and reviewed with a high frequency filter of 70Hz  and a low frequency filter of 1Hz . EEG data were recorded continuously and digitally stored.   Description: The posterior dominant rhythm consists of 8-9 Hz activity of moderate voltage (25-35 uV) seen predominantly in posterior head regions, symmetric and reactive to eye opening and eye closing. Sleep was characterized by vertex waves, sleep spindles (12 to 14 Hz), maximal frontocentral region. Spike was noted in left  anterior temporal region. Hyperventilation and photic stimulation were not performed.     ABNORMALITY -Spike, left  anterior temporal region  IMPRESSION: This study is showed evidence of potential epileptogenicity arising from left  anterior temporal region. No seizures were seen throughout the recording.  Shaquilla Kehres 

## 2021-04-23 NOTE — Progress Notes (Signed)
EEG complete - results pending 

## 2021-04-23 NOTE — Progress Notes (Signed)
Neurology Progress Note  Patient ID:  Jennifer Calhoun is an 80 y.o. female with a PMHx of orthostatic hypotension, SBO x 3, TIA, HLD and seasonal allergies who presented to the [facility] ED 5/30 afternoon after onset of intermittent symptoms of difficulty forming words, dizziness/lightheadedness and dyspnea on exertion. Her difficulty speaking occurred 5/29 and has continued intermittently. She describes this as "difficulty getting the words out".  Initially consulted for: Speech difficulty   Subjective: -Reports her speech difficulties have resolved and feels they have only occurred in the setting of dizziness -Reports no further issues of speech impairment since her interactions with Dr. Otelia Limes in the morning -Tolerated single dose of Keppra well without oversedation  Exam: Vitals:   04/23/21 0400 04/23/21 1147  BP: (!) 150/58 (!) 183/76  Pulse: 80 86  Resp: 20 (!) 22  Temp: 98.2 F (36.8 C) 98.1 F (36.7 C)  SpO2: 99% 100%   Gen: In bed, comfortable  Resp: non-labored breathing, no grossly audible wheezing Cardiac: Perfusing extremities well  Abd: soft, nt Psychiatric: Calm, cooperative  Neuro: MS: Alert, awake, oriented to person, place, time and situation.  Gives a clear and coherent history.  At times she has brief pauses in her speech but these appear like natural pauses as she processes information and she reports this is distinct from the speech difficulty she was experiencing earlier CN: Face symmetric, tongue midline, EOMI Motor: No pronator drift bilaterally. Sensory: Intact to light touch in all 4 extremities Gait: Ambulates easily with a narrow-based casual gait  Pertinent Labs: Lab Results  Component Value Date   CHOL 136 04/23/2021   HDL 50 04/23/2021   LDLCALC 76 04/23/2021   TRIG 51 04/23/2021   CHOLHDL 2.7 04/23/2021      EEG 5/31 Description: The posterior dominant rhythm consists of 8-9 Hz activity of moderate voltage (25-35 uV) seen predominantly in  posterior head regions, symmetric and reactive to eye opening and eye closing. Sleep was characterized by vertex waves, sleep spindles (12 to 14 Hz), maximal frontocentral region. Spike was noted in left  anterior temporal region. Hyperventilation and photic stimulation were not performed.    ABNORMALITY -Spike, left  anterior temporal region IMPRESSION: This study is showed evidence of potential epileptogenicity arising from left  anterior temporal region. No seizures were seen throughout the recording.   MRI brain personally reviewed, mild chronic microvascular ischemic disease for age without acute intracranial abnormality (agree with radiology read) MRA personally reviewed negative for LVO.  Concern for mild short segment stenosis of the proximal left M1 (30%); however this is not appreciated on further imaging below (CTA) CTA personally reviewed, no stenosis, suggesting MRA finding was artifactual   ECHO:  1. Left ventricular ejection fraction, by estimation, is 60 to 65%. The  left ventricle has normal function. The left ventricle has no regional  wall motion abnormalities. There is mild left ventricular hypertrophy.  Left ventricular diastolic parameters  are consistent with Grade II diastolic dysfunction (pseudonormalization).  Elevated left atrial pressure.  2. Right ventricular systolic function is normal. The right ventricular  size is normal. There is mildly elevated pulmonary artery systolic  pressure. The estimated right ventricular systolic pressure is 35.5 mmHg.  3. The mitral valve is degenerative. Mild mitral valve regurgitation.  4. The aortic valve is tricuspid. There is moderate calcification of the  aortic valve. Aortic valve regurgitation is not visualized. Mild to  moderate aortic valve stenosis. Vmax 2.3 m/s, MG 12 mmHg, AVA 1.1 cm^2, DI  0.4.  5. The inferior vena cava is normal in size with greater than 50%  respiratory variability, suggesting right atrial  pressure of 3 mmHg.  Normal biatrial sizes   Impression: This is a 80 year old woman with past medical history significant for orthostatic hypotension, small bowel obstruction, prior TIA, hyperlipidemia.  She was admitted due to hypertensive emergency/urgency with dizziness and intermittent speech difficulties.  MRI brain and vessel imaging as documented above are reassuring.  Echocardiogram showed some diastolic dysfunction and mild LVH but without left atrial enlargement relatively lower risk for occult atrial fibrillation (which additionally would not be expected to result in stereotyped symptoms).  The stereotyped speech difficulties she was having in combination with EEG showing epileptogenic activity in the left temporal region is highly concerning for focal seizure as an explanation for her symptoms.  We had an extended conversation greater than 30 minutes, with a family member or close friend asking questions on the phone as well (patient requested to be included in the conversation).  At this time patient lives independently and continues to drive, she is very concerned about the effects of a diagnosis of epilepsy on her lifestyle and attributes her speech difficulties to stress at this time.  She is requesting further consultation with our epileptologist tomorrow.  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   CrCl 30 to <50 mL/minute/1.73 m2: 250 to 750 mg every 12 hours. Standard seizure precautions, reviewed with patient: Per [facility] statutes, patients with seizures are not allowed to drive until  they have been seizure-free for six months. Use  caution when using heavy equipment or power tools. Avoid working on ladders or at heights. Take showers instead of baths. Ensure the water temperature is not too high on the home water heater. Do not go swimming alone. When caring for infants or small children, sit down when holding, feeding, or changing them to minimize risk of injury to the child in the event you have a seizure.  To reduce risk of seizures, maintain good sleep hygiene avoid alcohol and illicit drug use, take all anti-seizure medications as prescribed.  -Further discussion with Dr. Melynda Ripple tomorrow  Brooke Dare MD-PhD [facility] [phone]  Available 7 AM to 7 PM, outside these hours please contact Neurologist on call listed on AMION   Greater than 45 minutes were spent in care of this patient today, greater than 50% of which was at bedside with extensive discussion regarding echocardiogram, MRI, vessel imaging, and EEG findings, review of the nature and quality of her speech difficulties, implications of a diagnosis of epilepsy including seizure precautions as detailed above

## 2021-04-23 NOTE — Evaluation (Signed)
Occupational Therapy Evaluation Patient Details Name: Jennifer Calhoun MRN: [id number] DOB: [birth year] Today's Date: 04/23/2021    History of Present Illness Pt is 80 yo female who presented on 5/30 with 2 wk h/o dizziness and some speech difficulties. CT head neg for acute event.  Had been in ED 2 wks prior also with dizziness as well as hyponatremia and blood in stool.  PMH: orthostatic hypotension, HLD, SBO x3, TIA.   Clinical Impression   PTA, pt was living alone and completing ADLs and IADLs independently. Currently, pt is able to complete ADLs and functional mobility with supervision for safety due to pt report of dizziness. Pt was able to correctly answer simple money management question, and correctly recall 3/3 words for delayed memory recall task. Pt reports that she has a safety call button she wears and a friend that is always available to help her. Will continue to follow acutely to optimize safety during ADLs. No follow up OT recommended following discharge.     Follow Up Recommendations  Supervision - Intermittent;No OT follow up    Equipment Recommendations  None recommended by OT (No Ot recommended at this time.)    Recommendations for Other Services PT consult     Precautions / Restrictions Precautions Precautions: Fall Precaution Comments: intermittent dizziness Restrictions Weight Bearing Restrictions: No      Mobility Bed Mobility Overal bed mobility: Modified Independent             General bed mobility comments: Pt EOB upon arrival    Transfers Overall transfer level: Needs assistance Equipment used: None Transfers: Sit to/from Stand Sit to Stand: Supervision         General transfer comment: Pt required supervision for safety. Pt reports forgetting to slow down and pause after initial stand to avoid becoming light headed.    Balance Overall balance assessment: Needs assistance Sitting-balance support: Feet supported Sitting balance-Leahy  Scale: Good     Standing balance support: During functional activity;No upper extremity supported Standing balance-Leahy Scale: Good Standing balance comment: Pt able to reach across bed in standing to retrieve call bell and pour water into a cup at table.               High Level Balance Comments: Pt completed tub transfer with [facility] for safety.           ADL either performed or assessed with clinical judgement   ADL Overall ADL's : Needs assistance/impaired Eating/Feeding: Modified independent;Sitting   Grooming: Wash/dry hands;Oral care;Supervision/safety;Standing   Upper Body Bathing: Supervision/ safety;Standing   Lower Body Bathing: Supervison/ safety;Sit to/from stand   Upper Body Dressing : Sitting;Modified independent   Lower Body Dressing: Sit to/from stand;Supervision/safety   Toilet Transfer: Supervision/safety;Ambulation;Regular Teacher, adult education Details (indicate cue type and reason): Pt ambulated to toilet and completed toilet transfer with supervision for safety. Toileting- Clothing Manipulation and Hygiene: Supervision/safety;Sit to/from stand Toileting - Clothing Manipulation Details (indicate cue type and reason): Pt completed toilet hygiene and clothing manipulation with supervision. Tub/ Shower Transfer: Min guard;Ambulation Tub/Shower Transfer Details (indicate cue type and reason): Pt completed tub/shower transfer with min guard for safety. Functional mobility during ADLs: Supervision/safety General ADL Comments: Pt completed ADLs with supervision for safety when standing due to reported dizziness. Pt reports that she stays close to walls and counters due to fear of becoming dizzy.     Vision Baseline Vision/History: Wears glasses Wears Glasses: Reading only Patient Visual Report: No change from baseline (Pt reports floaters in  eyes, but reports she has been experiencing this for an extended period of time and is being followed by her  doctor.) Vision Assessment?: No apparent visual deficits     Perception Perception Perception Tested?: No Comments: No apparent perception difficulties   Praxis Praxis Praxis tested?: Within functional limits Praxis-Other Comments: No apparent difficulty with motor planning    Pertinent Vitals/Pain Pain Assessment: No/denies pain     Hand Dominance     Extremity/Trunk Assessment Upper Extremity Assessment Upper Extremity Assessment: Generalized weakness (4/5 grip strength)   Lower Extremity Assessment Lower Extremity Assessment: Defer to PT evaluation RLE Deficits / Details: tests WFL on MMT but pt has been noting fatigue B quads/ knees as she has been steadily decreasing activity recently with intermittent dizziness. Did participate in silver sneakers until last few months RLE Sensation: WNL RLE Coordination: WNL   Cervical / Trunk Assessment Cervical / Trunk Assessment: Normal   Communication Communication Communication: No difficulties (Pt reported increased time with word finding when anxious. Not present throughout session.)   Cognition Arousal/Alertness: Awake/alert Behavior During Therapy: WFL for tasks assessed/performed Overall Cognitive Status: Within Functional Limits for tasks assessed                                 General Comments: Pt conversational and appropriate throughout session. Pt recalled 3/3 words on memory recall test. Pt correctly answered simple money management problem.   General Comments  Pt reports that she has a "safety button" that she wears and that she has a friend nearby who is always available.    Exercises     Shoulder Instructions      Home Living Family/patient expects to be discharged to:: Private residence Living Arrangements: Alone Available Help at Discharge: Friend(s);Family Type of Home: House Home Access: Level entry     Home Layout: One level     Bathroom Shower/Tub: Contractor: Standard     Home Equipment: Other (comment) (safety button)   Additional Comments: retired Public house manager. Has 2 "daughters" who watch out for her      Prior Functioning/Environment Level of Independence: Independent        Comments: Pt reports taking care of herself and her hoem independently        OT Problem List: Decreased strength      OT Treatment/Interventions: Self-care/ADL training;Therapeutic exercise;Therapeutic activities    OT Goals(Current goals can be found in the care plan section) Acute Rehab OT Goals Patient Stated Goal: To go home today OT Goal Formulation: With patient Time For Goal Achievement: 05/07/21 Potential to Achieve Goals: Good ADL Goals Pt Will Transfer to Toilet: with modified independence;ambulating;regular height toilet Pt Will Perform Toileting - Clothing Manipulation and hygiene: with modified independence;sit to/from stand Pt Will Perform Tub/Shower Transfer: Tub transfer;with modified independence;ambulating Additional ADL Goal #1: Pt will complete 5 part path finding task with mod I. Additional ADL Goal #2: Pt will demonstrate ability to manage medication with mod I as demonstrated by correctly completing the pillbox test.  OT Frequency: Min 2X/week   Barriers to D/C:            Co-evaluation              AM-PAC OT "6 Clicks" Daily Activity     Outcome Measure Help from another person eating meals?: None Help from another person taking care of personal grooming?: None Help from another person toileting,  which includes using toliet, bedpan, or urinal?: A Little Help from another person bathing (including washing, rinsing, drying)?: A Little Help from another person to put on and taking off regular upper body clothing?: None Help from another person to put on and taking off regular lower body clothing?: A Little 6 Click Score: 21   End of Session Equipment Utilized During Treatment: Gait belt Nurse Communication: Mobility  status  Activity Tolerance: Patient tolerated treatment well Patient left: in chair;with call bell/phone within reach;Other (comment) (other staff in room)  OT Visit Diagnosis: Unsteadiness on feet (R26.81);Dizziness and giddiness (R42)                Time: 9371-6967 OT Time Calculation (min): 26 min Charges:  OT General Charges $OT Visit: 1 Visit OT Evaluation $OT Eval Low Complexity: 1 Low OT Treatments $Self Care/Home Management : 8-22 mins  Ladene Artist, OTDS  Ladene Artist 04/23/2021, 1:50 PM

## 2021-04-23 NOTE — Progress Notes (Signed)
PROGRESS NOTE    Jennifer Calhoun  ZOX:[id number] DOB: 05/16/1941 DOA: 04/22/2021 PCP: Marva PandaMillsaps, Kimberly, NP   Brief Narrative:  HPI on 04/22/2021 by Dr. Margie Egeyrone Kyle Jennifer HazelKathleen S Calhoun is a 80 y.o. female with medical history significant of orthostatic hypotension, HLD. Presenting with dizziness. The patient has had dizziness for the last 2 weeks. She was seen in the ED 2 weeks ago and found to by hyponatremic. At the time she had also complained of some blood in her stool. No blood was noted in her stool at the time. She decided that she could follow up with her regular doctors on her own, so she declined admission. She checked in with her PCP a few days later and her sodium was a little lower. She states that she was drinking a lot of water and got the recommendation to decrease her intake. Her dizziness seemed to be a little better until yesterday. Her dizziness returned and she seemed to have difficulty forming her words. This continued through this morning. She became concerned and came to the ED for help. She denies any other aggravating or alleviating factors.   Assessment & Plan   Expressive aphasia, TIA vs Seizure -Patient presented with dizziness, difficulty getting her words out -Patient states that she had another episode approximately 530 this morning where she had wanted to go to the bathroom, experienced dizziness as well as speech abnormalities and her arm was shaking. -CT head was unremarkable for intercranial normality -MRI showed no acute or cranial normality.  Mild chronic microvascular ischemic disease for age. -MRA head negative for large vessel occlusion.  Mild intracranial atherosclerotic change for age associated with mid short segment stenosis of proximal left M1 segment. -MRA neck Short segment mild stenosis of up to 30% involving the origin/proximal right ICA patent vertebral arteries within the neck without stenosis -Given symptoms this morning, EEG obtained which showed  evidence of potential epileptogenicity arising from left anterior temporal region.  No seizures were seen throughout recording. -Echocardiogram shows an EF of 60 to 65%, grade 2 diastolic dysfunction.  Mild LVH.  Mild MV regurg. -LDL 76, hemoglobin A1c pending -PT, OT pending -Neurology consulted and appreciated, pending further recommendations -started on keppra  Hypertensive urgency -Patient with a history of orthostatic hypotension and uses TED hose.  She used to follow with Dr. Jacinto HalimGanji -SBP on admission noted to be in the 200s however has now improved -Allowing for permissive hypertension  Mildly elevated BNP -BNP 171.3 -Chest x-ray unremarkable  -Echocardiogram as above  Normocytic anemia -Patient admitted to having blood in her stool approximately 1 week ago but does have a history of diverticulosis -Hemoglobin currently 11 and appears to be at baseline   DVT Prophylaxis SCDs  Code Status: Full  Family Communication: None at bedside  Disposition Plan:  Status is: Inpatient  Remains inpatient appropriate because:Ongoing diagnostic testing needed not appropriate for outpatient work up and Inpatient level of care appropriate due to severity of illness   Dispo: The patient is from: Home              Anticipated d/c is to: Home              Patient currently is not medically stable to d/c.   Difficult to place patient No  Consultants Neurology  Procedures  EEG Echocardiogram  Antibiotics   Anti-infectives (From admission, onward)   None      Subjective:   Jennifer Calhoun seen and examined today.  She states  she had similar episode of having difficulty with her words as well as arm shaking and dizziness this morning.  Currently denies any further symptoms.  Denies chest pain or shortness of breath, abdominal pain, nausea or vomiting, diarrhea constipation, dizziness or headache.  Objective:   Vitals:   04/22/21 2359 04/23/21 0200 04/23/21 0400 04/23/21 1147   BP: 131/60 137/60 (!) 150/58 (!) 183/76  Pulse: 91 85 80 86  Resp: 20  20 (!) 22  Temp: 98 F (36.7 C) 98.2 F (36.8 C) 98.2 F (36.8 C) 98.1 F (36.7 C)  TempSrc: Oral Oral Oral Oral  SpO2: 98% 100% 99% 100%  Weight:      Height:        Intake/Output Summary (Last 24 hours) at 04/23/2021 1343 Last data filed at 04/23/2021 0400 Gross per 24 hour  Intake 240 ml  Output --  Net 240 ml   Filed Weights   04/22/21 1152  Weight: 79.4 kg    Exam  General: Well developed, well nourished, NAD, appears stated age  HEENT: NCAT, mucous membranes moist.   Cardiovascular: S1 S2 auscultated, soft SEM, RRR  Respiratory: Clear to auscultation bilaterally   Abdomen: Soft, nontender, nondistended, + bowel sounds  Extremities: warm dry without cyanosis clubbing or edema  Neuro: AAOx3, nonfocal  Psych: Appropriate mood and affect   Data Reviewed: I have personally reviewed following labs and imaging studies  CBC: Recent Labs  Lab 04/22/21 1238  WBC 3.5*  HGB 11.0*  HCT 35.1*  MCV 96.4  PLT 224   Basic Metabolic Panel: Recent Labs  Lab 04/22/21 1238  NA 137  K 3.6  CL 107  CO2 23  GLUCOSE 88  BUN <5*  CREATININE 1.13*  CALCIUM 9.1   GFR: Estimated Creatinine Clearance: 40.9 mL/min (A) (by C-G formula based on SCr of 1.13 mg/dL (H)). Liver Function Tests: No results for input(s): AST, ALT, ALKPHOS, BILITOT, PROT, ALBUMIN in the last 168 hours. No results for input(s): LIPASE, AMYLASE in the last 168 hours. No results for input(s): AMMONIA in the last 168 hours. Coagulation Profile: No results for input(s): INR, PROTIME in the last 168 hours. Cardiac Enzymes: Recent Labs  Lab 04/23/21 0250  CKTOTAL 73   BNP (last 3 results) No results for input(s): PROBNP in the last 8760 hours. HbA1C: No results for input(s): HGBA1C in the last 72 hours. CBG: Recent Labs  Lab 04/22/21 1216  GLUCAP 91   Lipid Profile: Recent Labs    04/23/21 0250  CHOL 136   HDL 50  LDLCALC 76  TRIG 51  CHOLHDL 2.7   Thyroid Function Tests: No results for input(s): TSH, T4TOTAL, FREET4, T3FREE, THYROIDAB in the last 72 hours. Anemia Panel: No results for input(s): VITAMINB12, FOLATE, FERRITIN, TIBC, IRON, RETICCTPCT in the last 72 hours. Urine analysis:    Component Value Date/Time   COLORURINE STRAW (A) 04/22/2021 1204   APPEARANCEUR CLEAR 04/22/2021 1204   LABSPEC 1.005 04/22/2021 1204   PHURINE 6.0 04/22/2021 1204   GLUCOSEU NEGATIVE 04/22/2021 1204   HGBUR NEGATIVE 04/22/2021 1204   BILIRUBINUR NEGATIVE 04/22/2021 1204   KETONESUR NEGATIVE 04/22/2021 1204   PROTEINUR NEGATIVE 04/22/2021 1204   UROBILINOGEN 0.2 05/15/2011 1907   NITRITE NEGATIVE 04/22/2021 1204   LEUKOCYTESUR LARGE (A) 04/22/2021 1204   Sepsis Labs: @LABRCNTIP (procalcitonin:4,lacticidven:4)  ) Recent Results (from the past 240 hour(s))  Resp Panel by RT-PCR (Flu A&B, Covid) Nasopharyngeal Swab     Status: None   Collection Time: 04/22/21  3:58 PM   Specimen: Nasopharyngeal Swab; Nasopharyngeal(NP) swabs in vial transport medium  Result Value Ref Range Status   SARS Coronavirus 2 by RT PCR NEGATIVE NEGATIVE Final    Comment: (NOTE) SARS-CoV-2 target nucleic acids are NOT DETECTED.  The SARS-CoV-2 RNA is generally detectable in upper respiratory specimens during the acute phase of infection. The lowest concentration of SARS-CoV-2 viral copies this assay can detect is 138 copies/mL. A negative result does not preclude SARS-Cov-2 infection and should not be used as the sole basis for treatment or other patient management decisions. A negative result may occur with  improper specimen collection/handling, submission of specimen other than nasopharyngeal swab, presence of viral mutation(s) within the areas targeted by this assay, and inadequate number of viral copies(<138 copies/mL). A negative result must be combined with clinical observations, patient history, and  epidemiological information. The expected result is Negative.  Fact Sheet for Patients:  BloggerCourse.com  Fact Sheet for Healthcare Providers:  SeriousBroker.it  This test is no t yet approved or cleared by the [place] FDA and  has been authorized for detection and/or diagnosis of SARS-CoV-2 by FDA under an Emergency Use Authorization (EUA). This EUA will remain  in effect (meaning this test can be used) for the duration of the COVID-19 declaration under Section 564(b)(1) of the Act, 21 U.S.C.section 360bbb-3(b)(1), unless the authorization is terminated  or revoked sooner.       Influenza A by PCR NEGATIVE NEGATIVE Final   Influenza B by PCR NEGATIVE NEGATIVE Final    Comment: (NOTE) The Xpert Xpress SARS-CoV-2/FLU/RSV plus assay is intended as an aid in the diagnosis of influenza from Nasopharyngeal swab specimens and should not be used as a sole basis for treatment. Nasal washings and aspirates are unacceptable for Xpert Xpress SARS-CoV-2/FLU/RSV testing.  Fact Sheet for Patients: BloggerCourse.com  Fact Sheet for Healthcare Providers: SeriousBroker.it  This test is not yet approved or cleared by the [place] FDA and has been authorized for detection and/or diagnosis of SARS-CoV-2 by FDA under an Emergency Use Authorization (EUA). This EUA will remain in effect (meaning this test can be used) for the duration of the COVID-19 declaration under Section 564(b)(1) of the Act, 21 U.S.C. section 360bbb-3(b)(1), unless the authorization is terminated or revoked.  Performed at [facility], [address], [place], [place] [address]       Radiology Studies: CT Head Wo Contrast  Result Date: 04/22/2021 CLINICAL DATA:  Difficulty forming words and dizziness. EXAM: CT HEAD WITHOUT CONTRAST TECHNIQUE: Contiguous axial images were obtained from the  base of the skull through the vertex without intravenous contrast. COMPARISON:  May 30, 2002 FINDINGS: Brain: No evidence of acute infarction, hemorrhage, hydrocephalus, extra-axial collection or mass lesion/mass effect. Mild brain parenchymal volume loss and deep white matter microangiopathy. Vascular: No hyperdense vessel or unexpected calcification. Skull: Normal. Negative for fracture or focal lesion. Sinuses/Orbits: No acute finding. Other: None. IMPRESSION: 1. No acute intracranial abnormality. 2. Mild brain parenchymal atrophy and chronic microvascular disease. Electronically Signed   By: Ted Mcalpine M.D.   On: 04/22/2021 13:23   MR ANGIO HEAD WO CONTRAST  Result Date: 04/22/2021 CLINICAL DATA:  Initial evaluation for acute TIA. EXAM: MRI HEAD WITHOUT CONTRAST MRA HEAD WITHOUT CONTRAST MRA NECK WITHOUT AND WITH CONTRAST TECHNIQUE: Multiplanar, multi-echo pulse sequences of the brain and surrounding structures were acquired without intravenous contrast. Angiographic images of the Circle of Willis were acquired using MRA technique without intravenous contrast. Angiographic images of the  neck were acquired using MRA technique without and with intravenous contrast. Carotid stenosis measurements (when applicable) are obtained utilizing NASCET criteria, using the distal internal carotid diameter as the denominator. CONTRAST:  8mL GADAVIST GADOBUTROL 1 MMOL/ML IV SOLN COMPARISON:  Prior CT from earlier the same day. FINDINGS: MRI HEAD FINDINGS Brain: Cerebral volume within normal limits for patient age. Scattered patchy T2/FLAIR hyperintensity within the periventricular, deep, and subcortical white matter both cerebral hemispheres, most consistent with chronic small vessel ischemic disease, mild for age. No abnormal foci of restricted diffusion to suggest acute or subacute ischemia. Gray-white matter differentiation maintained. No encephalomalacia to suggest chronic cortical infarction. No evidence for  acute or chronic intracranial hemorrhage. No mass lesion, midline shift or mass effect no hydrocephalus or extra-axial fluid collection. Pituitary gland suprasellar region normal. Midline structures intact. Vascular: Major intracranial vascular flow voids are maintained. Skull and upper cervical spine: Craniocervical junction within normal limits. Bone marrow signal intensity normal. No scalp soft tissue abnormality. Sinuses/Orbits: Patient status post bilateral ocular lens replacement. Paranasal sinuses are clear. No significant mastoid effusion. Inner ear structures grossly normal. Other: None. MRA HEAD FINDINGS ANTERIOR CIRCULATION: Visualized distal cervical segments of the internal carotid arteries are patent with antegrade flow. Petrous, cavernous, and supraclinoid segments patent without stenosis or other abnormality. A1 segments patent. Right A1 hypoplastic. Normal anterior communicating artery complex. Anterior cerebral arteries patent to their distal aspects without stenosis. Possible short-segment fenestration at the proximal left A2 segment noted. Right M1 widely patent. Normal right MCA bifurcation. Distal right MCA branches well perfused. Atheromatous irregularity with suspected mild short-segment stenosis at the proximal left M1 segment (series 12, image 6). Normal left MCA bifurcation. Distal left MCA branches well perfused. POSTERIOR CIRCULATION: Both V4 segments patent to the vertebrobasilar junction without stenosis. Right vertebral artery dominant. Both PICA patent. Basilar patent to its distal aspect without stenosis. Superior cerebellar arteries patent bilaterally. Left PCA supplied via the basilar as well as a small left posterior communicating artery. Fetal type origin of the right PCA. Both PCAs perfused to their distal aspects without stenosis. No aneurysm. MRA NECK FINDINGS AORTIC ARCH: Visualized aortic arch normal in caliber with normal branch pattern. No hemodynamically significant  stenosis seen about the origin of the great vessels. RIGHT CAROTID SYSTEM: Visualized right CCA patent to the bifurcation without stenosis. Atheromatous irregularity about the right carotid bulb/proximal right ICA with associated short-segment stenosis of up to 30% by NASCET criteria. Right ICA patent distally without stenosis, evidence for dissection or occlusion. LEFT CAROTID SYSTEM: Left CCA patent from its origin to the bifurcation without stenosis. No significant atheromatous irregularity or narrowing about the left bifurcation. Left ICA patent distally without stenosis, evidence for dissection or occlusion. VERTEBRAL ARTERIES: Both vertebral arteries arise from subclavian arteries. Right vertebral artery dominant. No visible proximal subclavian artery stenosis on this motion degraded exam. Proximal left vertebral artery somewhat limited assessment due to adjacent venous contamination. Visualized portions of the vertebral arteries patent without stenosis, evidence for dissection or occlusion. IMPRESSION: MRI HEAD IMPRESSION: 1. No acute intracranial abnormality. 2. Mild chronic microvascular ischemic disease for age. MRA HEAD IMPRESSION: 1. Negative intracranial MRA for large vessel occlusion. 2. Mild intracranial atherosclerotic change for age with associated mild short-segment stenosis of the proximal left M1 segment. No other hemodynamically significant or correctable stenosis. MRA NECK IMPRESSION: 1. Short-segment mild stenosis of up to 30% by NASCET criteria involving the origin/proximal right ICA. 2. No hemodynamically significant stenosis identified within the left carotid artery system. 3.  Patent vertebral arteries within the neck without stenosis. Right vertebral artery dominant. Electronically Signed   By: Rise Mu M.D.   On: 04/22/2021 20:10   MR ANGIO NECK W WO CONTRAST  Result Date: 04/22/2021 CLINICAL DATA:  Initial evaluation for acute TIA. EXAM: MRI HEAD WITHOUT CONTRAST MRA HEAD  WITHOUT CONTRAST MRA NECK WITHOUT AND WITH CONTRAST TECHNIQUE: Multiplanar, multi-echo pulse sequences of the brain and surrounding structures were acquired without intravenous contrast. Angiographic images of the Circle of Willis were acquired using MRA technique without intravenous contrast. Angiographic images of the neck were acquired using MRA technique without and with intravenous contrast. Carotid stenosis measurements (when applicable) are obtained utilizing NASCET criteria, using the distal internal carotid diameter as the denominator. CONTRAST:  60mL GADAVIST GADOBUTROL 1 MMOL/ML IV SOLN COMPARISON:  Prior CT from earlier the same day. FINDINGS: MRI HEAD FINDINGS Brain: Cerebral volume within normal limits for patient age. Scattered patchy T2/FLAIR hyperintensity within the periventricular, deep, and subcortical white matter both cerebral hemispheres, most consistent with chronic small vessel ischemic disease, mild for age. No abnormal foci of restricted diffusion to suggest acute or subacute ischemia. Gray-white matter differentiation maintained. No encephalomalacia to suggest chronic cortical infarction. No evidence for acute or chronic intracranial hemorrhage. No mass lesion, midline shift or mass effect no hydrocephalus or extra-axial fluid collection. Pituitary gland suprasellar region normal. Midline structures intact. Vascular: Major intracranial vascular flow voids are maintained. Skull and upper cervical spine: Craniocervical junction within normal limits. Bone marrow signal intensity normal. No scalp soft tissue abnormality. Sinuses/Orbits: Patient status post bilateral ocular lens replacement. Paranasal sinuses are clear. No significant mastoid effusion. Inner ear structures grossly normal. Other: None. MRA HEAD FINDINGS ANTERIOR CIRCULATION: Visualized distal cervical segments of the internal carotid arteries are patent with antegrade flow. Petrous, cavernous, and supraclinoid segments patent  without stenosis or other abnormality. A1 segments patent. Right A1 hypoplastic. Normal anterior communicating artery complex. Anterior cerebral arteries patent to their distal aspects without stenosis. Possible short-segment fenestration at the proximal left A2 segment noted. Right M1 widely patent. Normal right MCA bifurcation. Distal right MCA branches well perfused. Atheromatous irregularity with suspected mild short-segment stenosis at the proximal left M1 segment (series 12, image 6). Normal left MCA bifurcation. Distal left MCA branches well perfused. POSTERIOR CIRCULATION: Both V4 segments patent to the vertebrobasilar junction without stenosis. Right vertebral artery dominant. Both PICA patent. Basilar patent to its distal aspect without stenosis. Superior cerebellar arteries patent bilaterally. Left PCA supplied via the basilar as well as a small left posterior communicating artery. Fetal type origin of the right PCA. Both PCAs perfused to their distal aspects without stenosis. No aneurysm. MRA NECK FINDINGS AORTIC ARCH: Visualized aortic arch normal in caliber with normal branch pattern. No hemodynamically significant stenosis seen about the origin of the great vessels. RIGHT CAROTID SYSTEM: Visualized right CCA patent to the bifurcation without stenosis. Atheromatous irregularity about the right carotid bulb/proximal right ICA with associated short-segment stenosis of up to 30% by NASCET criteria. Right ICA patent distally without stenosis, evidence for dissection or occlusion. LEFT CAROTID SYSTEM: Left CCA patent from its origin to the bifurcation without stenosis. No significant atheromatous irregularity or narrowing about the left bifurcation. Left ICA patent distally without stenosis, evidence for dissection or occlusion. VERTEBRAL ARTERIES: Both vertebral arteries arise from subclavian arteries. Right vertebral artery dominant. No visible proximal subclavian artery stenosis on this motion degraded  exam. Proximal left vertebral artery somewhat limited assessment due to adjacent venous contamination. Visualized portions  of the vertebral arteries patent without stenosis, evidence for dissection or occlusion. IMPRESSION: MRI HEAD IMPRESSION: 1. No acute intracranial abnormality. 2. Mild chronic microvascular ischemic disease for age. MRA HEAD IMPRESSION: 1. Negative intracranial MRA for large vessel occlusion. 2. Mild intracranial atherosclerotic change for age with associated mild short-segment stenosis of the proximal left M1 segment. No other hemodynamically significant or correctable stenosis. MRA NECK IMPRESSION: 1. Short-segment mild stenosis of up to 30% by NASCET criteria involving the origin/proximal right ICA. 2. No hemodynamically significant stenosis identified within the left carotid artery system. 3. Patent vertebral arteries within the neck without stenosis. Right vertebral artery dominant. Electronically Signed   By: Rise Mu M.D.   On: 04/22/2021 20:10   MR BRAIN WO CONTRAST  Result Date: 04/22/2021 CLINICAL DATA:  Initial evaluation for acute TIA. EXAM: MRI HEAD WITHOUT CONTRAST MRA HEAD WITHOUT CONTRAST MRA NECK WITHOUT AND WITH CONTRAST TECHNIQUE: Multiplanar, multi-echo pulse sequences of the brain and surrounding structures were acquired without intravenous contrast. Angiographic images of the Circle of Willis were acquired using MRA technique without intravenous contrast. Angiographic images of the neck were acquired using MRA technique without and with intravenous contrast. Carotid stenosis measurements (when applicable) are obtained utilizing NASCET criteria, using the distal internal carotid diameter as the denominator. CONTRAST:  8mL GADAVIST GADOBUTROL 1 MMOL/ML IV SOLN COMPARISON:  Prior CT from earlier the same day. FINDINGS: MRI HEAD FINDINGS Brain: Cerebral volume within normal limits for patient age. Scattered patchy T2/FLAIR hyperintensity within the  periventricular, deep, and subcortical white matter both cerebral hemispheres, most consistent with chronic small vessel ischemic disease, mild for age. No abnormal foci of restricted diffusion to suggest acute or subacute ischemia. Gray-white matter differentiation maintained. No encephalomalacia to suggest chronic cortical infarction. No evidence for acute or chronic intracranial hemorrhage. No mass lesion, midline shift or mass effect no hydrocephalus or extra-axial fluid collection. Pituitary gland suprasellar region normal. Midline structures intact. Vascular: Major intracranial vascular flow voids are maintained. Skull and upper cervical spine: Craniocervical junction within normal limits. Bone marrow signal intensity normal. No scalp soft tissue abnormality. Sinuses/Orbits: Patient status post bilateral ocular lens replacement. Paranasal sinuses are clear. No significant mastoid effusion. Inner ear structures grossly normal. Other: None. MRA HEAD FINDINGS ANTERIOR CIRCULATION: Visualized distal cervical segments of the internal carotid arteries are patent with antegrade flow. Petrous, cavernous, and supraclinoid segments patent without stenosis or other abnormality. A1 segments patent. Right A1 hypoplastic. Normal anterior communicating artery complex. Anterior cerebral arteries patent to their distal aspects without stenosis. Possible short-segment fenestration at the proximal left A2 segment noted. Right M1 widely patent. Normal right MCA bifurcation. Distal right MCA branches well perfused. Atheromatous irregularity with suspected mild short-segment stenosis at the proximal left M1 segment (series 12, image 6). Normal left MCA bifurcation. Distal left MCA branches well perfused. POSTERIOR CIRCULATION: Both V4 segments patent to the vertebrobasilar junction without stenosis. Right vertebral artery dominant. Both PICA patent. Basilar patent to its distal aspect without stenosis. Superior cerebellar arteries  patent bilaterally. Left PCA supplied via the basilar as well as a small left posterior communicating artery. Fetal type origin of the right PCA. Both PCAs perfused to their distal aspects without stenosis. No aneurysm. MRA NECK FINDINGS AORTIC ARCH: Visualized aortic arch normal in caliber with normal branch pattern. No hemodynamically significant stenosis seen about the origin of the great vessels. RIGHT CAROTID SYSTEM: Visualized right CCA patent to the bifurcation without stenosis. Atheromatous irregularity about the right carotid bulb/proximal right ICA with  associated short-segment stenosis of up to 30% by NASCET criteria. Right ICA patent distally without stenosis, evidence for dissection or occlusion. LEFT CAROTID SYSTEM: Left CCA patent from its origin to the bifurcation without stenosis. No significant atheromatous irregularity or narrowing about the left bifurcation. Left ICA patent distally without stenosis, evidence for dissection or occlusion. VERTEBRAL ARTERIES: Both vertebral arteries arise from subclavian arteries. Right vertebral artery dominant. No visible proximal subclavian artery stenosis on this motion degraded exam. Proximal left vertebral artery somewhat limited assessment due to adjacent venous contamination. Visualized portions of the vertebral arteries patent without stenosis, evidence for dissection or occlusion. IMPRESSION: MRI HEAD IMPRESSION: 1. No acute intracranial abnormality. 2. Mild chronic microvascular ischemic disease for age. MRA HEAD IMPRESSION: 1. Negative intracranial MRA for large vessel occlusion. 2. Mild intracranial atherosclerotic change for age with associated mild short-segment stenosis of the proximal left M1 segment. No other hemodynamically significant or correctable stenosis. MRA NECK IMPRESSION: 1. Short-segment mild stenosis of up to 30% by NASCET criteria involving the origin/proximal right ICA. 2. No hemodynamically significant stenosis identified within the  left carotid artery system. 3. Patent vertebral arteries within the neck without stenosis. Right vertebral artery dominant. Electronically Signed   By: Rise Mu M.D.   On: 04/22/2021 20:10   DG CHEST PORT 1 VIEW  Result Date: 04/22/2021 CLINICAL DATA:  Shortness of breath and dizziness EXAM: PORTABLE CHEST 1 VIEW COMPARISON:  06/25/2016 FINDINGS: Cardiac shadow is mildly prominent but stable. Aortic calcifications are again seen. Calcifications are noted in the breasts bilaterally stable in appearance from prior exam and shown to be within the breasts on prior CT. No bony abnormality is seen. No focal infiltrate is noted. IMPRESSION: No active disease. Electronically Signed   By: Alcide Clever M.D.   On: 04/22/2021 17:29   EEG adult  Result Date: 04/23/2021 Charlsie Quest, MD     04/23/2021 12:03 PM Patient Name: MARKEESHA CHAR MRN: [id number] Epilepsy Attending: Charlsie Quest Referring Physician/Provider: Dr Caryl Pina Date: 04/23/2021 Duration: 22.40 mins Patient history: 80 year old female with intermittent spells of stuttering speech. EEG to evaluate for seizure. Level of alertness: Awake, asleep AEDs during EEG study: LEV Technical aspects: This EEG study was done with scalp electrodes positioned according to the 10-20 International system of electrode placement. Electrical activity was acquired at a sampling rate of  and reviewed with a high frequency filter of  and a low frequency filter of . EEG data were recorded continuously and digitally stored. Description: The posterior dominant rhythm consists of 8-9 Hz activity of moderate voltage (25-35 uV) seen predominantly in posterior head regions, symmetric and reactive to eye opening and eye closing. Sleep was characterized by vertex waves, sleep spindles (12 to 14 Hz), maximal frontocentral region. Spike was noted in left  anterior temporal region. Hyperventilation and photic stimulation were not performed.   ABNORMALITY  -Spike, left  anterior temporal region IMPRESSION: This study is showed evidence of potential epileptogenicity arising from left  anterior temporal region. No seizures were seen throughout the recording. Charlsie Quest   ECHOCARDIOGRAM COMPLETE  Result Date: 04/23/2021    ECHOCARDIOGRAM REPORT   Patient Name:   ARYKA COONRADT Date of Exam: 04/23/2021 Medical Rec #:  [id number]        Height:       63.0 in Accession #:    [id number]       Weight:       175.0 lb Date of Birth:  Apr 29, 1941        BSA:          1.827 m Patient Age:    79 years         BP:           150/58 mmHg Patient Gender: F                HR:           80 bpm. Exam Location:  Inpatient Procedure: 2D Echo, Cardiac Doppler and Color Doppler Indications:    TIA  History:        Patient has no prior history of Echocardiogram examinations.                 TIA; Risk Factors:Hypertension.  Sonographer:    Shirlean Kelly Referring Phys: [id number] [facility] Donye Dauenhauer IMPRESSIONS  1. Left ventricular ejection fraction, by estimation, is 60 to 65%. The left ventricle has normal function. The left ventricle has no regional wall motion abnormalities. There is mild left ventricular hypertrophy. Left ventricular diastolic parameters are consistent with Grade II diastolic dysfunction (pseudonormalization). Elevated left atrial pressure.  2. Right ventricular systolic function is normal. The right ventricular size is normal. There is mildly elevated pulmonary artery systolic pressure. The estimated right ventricular systolic pressure is 35.5 mmHg.  3. The mitral valve is degenerative. Mild mitral valve regurgitation.  4. The aortic valve is tricuspid. There is moderate calcification of the aortic valve. Aortic valve regurgitation is not visualized. Mild to moderate aortic valve stenosis. Vmax 2.3 m/s, MG 12 mmHg, AVA 1.1 cm^2, DI 0.4.  5. The inferior vena cava is normal in size with greater than 50% respiratory variability, suggesting right atrial pressure of 3  mmHg. FINDINGS  Left Ventricle: Left ventricular ejection fraction, by estimation, is 60 to 65%. The left ventricle has normal function. The left ventricle has no regional wall motion abnormalities. The left ventricular internal cavity size was normal in size. There is  mild left ventricular hypertrophy. Left ventricular diastolic parameters are consistent with Grade II diastolic dysfunction (pseudonormalization). Elevated left atrial pressure. Right Ventricle: The right ventricular size is normal. No increase in right ventricular wall thickness. Right ventricular systolic function is normal. There is mildly elevated pulmonary artery systolic pressure. The tricuspid regurgitant velocity is 2.85  m/s, and with an assumed right atrial pressure of 3 mmHg, the estimated right ventricular systolic pressure is 35.5 mmHg. Left Atrium: Left atrial size was normal in size. Right Atrium: Right atrial size was normal in size. Pericardium: There is no evidence of pericardial effusion. Mitral Valve: The mitral valve is degenerative in appearance. Mild mitral valve regurgitation. Tricuspid Valve: The tricuspid valve is normal in structure. Tricuspid valve regurgitation is trivial. Aortic Valve: The aortic valve is tricuspid. There is moderate calcification of the aortic valve. Aortic valve regurgitation is not visualized. Mild to moderate aortic stenosis is present. Aortic valve mean gradient measures 10.0 mmHg. Aortic valve peak gradient measures 17.3 mmHg. Aortic valve area, by VTI measures 1.40 cm. Pulmonic Valve: The pulmonic valve was not well visualized. Pulmonic valve regurgitation is mild. Aorta: The aortic root and ascending aorta are structurally normal, with no evidence of dilitation. Venous: The inferior vena cava is normal in size with greater than 50% respiratory variability, suggesting right atrial pressure of 3 mmHg. IAS/Shunts: The interatrial septum was not well visualized.  LEFT VENTRICLE PLAX 2D LVIDd:          4.30 cm  Diastology LVIDs:  2.90 cm  LV e' medial:    5.11 cm/s LV PW:         1.00 cm  LV E/e' medial:  29.5 LV IVS:        0.90 cm  LV e' lateral:   7.72 cm/s LVOT diam:     1.90 cm  LV E/e' lateral: 19.6 LV SV:         60 LV SV Index:   33 LVOT Area:     2.84 cm  RIGHT VENTRICLE             IVC RV S prime:     12.70 cm/s  IVC diam: 1.20 cm TAPSE (M-mode): 2.9 cm LEFT ATRIUM             Index       RIGHT ATRIUM           Index LA diam:        3.10 cm 1.70 cm/m  RA Area:     10.40 cm LA Vol (A2C):   53.0 ml 29.01 ml/m RA Volume:   18.40 ml  10.07 ml/m LA Vol (A4C):   52.0 ml 28.49 ml/m LA Biplane Vol: 52.6 ml 28.79 ml/m  AORTIC VALVE AV Area (Vmax):    1.39 cm AV Area (Vmean):   1.36 cm AV Area (VTI):     1.40 cm AV Vmax:           208.00 cm/s AV Vmean:          144.000 cm/s AV VTI:            0.429 m AV Peak Grad:      17.3 mmHg AV Mean Grad:      10.0 mmHg LVOT Vmax:         102.00 cm/s LVOT Vmean:        68.900 cm/s LVOT VTI:          0.212 m LVOT/AV VTI ratio: 0.49  AORTA Ao Root diam: 2.70 cm MITRAL VALVE                TRICUSPID VALVE MV Area (PHT): 4.60 cm     TR Peak grad:   32.5 mmHg MV Decel Time: 165 msec     TR Vmax:        285.00 cm/s MV E velocity: 151.00 cm/s MV A velocity: 168.00 cm/s  SHUNTS MV E/A ratio:  0.90         Systemic VTI:  0.21 m                             Systemic Diam: 1.90 cm Epifanio Lesches MD Electronically signed by Epifanio Lesches MD Signature Date/Time: 04/23/2021/12:12:29 PM    Final      Scheduled Meds: . aspirin EC  325 mg Oral Daily  . atorvastatin  20 mg Oral Daily  . cycloSPORINE  1 drop Both Eyes BID  . levETIRAcetam  500 mg Oral BID   Continuous Infusions:   LOS: 1 day   Time Spent in minutes   45 minutes  Addilyn Satterwhite D.O. on 04/23/2021 at 1:43 PM  Between 7am to 7pm - Please see pager noted on [email or web address]  After 7pm go to [email or web address]  And look for the night coverage person covering for me after hours  [facility] Office  [phone]

## 2021-04-23 NOTE — Progress Notes (Signed)
SLP Cancellation Note  Patient Details Name: Jennifer Calhoun MRN: [id number] DOB: May 17, 1941   Cancelled treatment:       Reason Eval/Treat Not Completed: SLP screened. Pt reports speech is at baseline. RN reports no difficulties when conversing with pt. No other cognitive/speech/language needs identified at this time. If future difficulties arise, re-consult ST services. Will sign off for now.    Avie Echevaria, MA, CCC-SLP Acute Rehabilitation Services Office Number: [phone]  Paulette Blanch 04/23/2021, 4:00 PM

## 2021-04-24 ENCOUNTER — Inpatient Hospital Stay (HOSPITAL_COMMUNITY): Payer: Medicare HMO

## 2021-04-24 DIAGNOSIS — I10 Essential (primary) hypertension: Secondary | ICD-10-CM | POA: Diagnosis not present

## 2021-04-24 DIAGNOSIS — R4701 Aphasia: Secondary | ICD-10-CM | POA: Diagnosis not present

## 2021-04-24 LAB — CBC
HCT: 30.8 % — ABNORMAL LOW (ref 36.0–46.0)
Hemoglobin: 10.2 g/dL — ABNORMAL LOW (ref 12.0–15.0)
MCH: 30 pg (ref 26.0–34.0)
MCHC: 33.1 g/dL (ref 30.0–36.0)
MCV: 90.6 fL (ref 80.0–100.0)
Platelets: 221 10*3/uL (ref 150–400)
RBC: 3.4 MIL/uL — ABNORMAL LOW (ref 3.87–5.11)
RDW: 13.8 % (ref 11.5–15.5)
WBC: 3.9 10*3/uL — ABNORMAL LOW (ref 4.0–10.5)
nRBC: 0 % (ref 0.0–0.2)

## 2021-04-24 LAB — BASIC METABOLIC PANEL
Anion gap: 9 (ref 5–15)
BUN: 5 mg/dL — ABNORMAL LOW (ref 8–23)
CO2: 28 mmol/L (ref 22–32)
Calcium: 9.2 mg/dL (ref 8.9–10.3)
Chloride: 99 mmol/L (ref 98–111)
Creatinine, Ser: 0.97 mg/dL (ref 0.44–1.00)
GFR, Estimated: 60 mL/min — ABNORMAL LOW (ref >60)
Glucose, Bld: 111 mg/dL — ABNORMAL HIGH (ref 70–99)
Potassium: 3.5 mmol/L (ref 3.5–5.1)
Sodium: 136 mmol/L (ref 135–145)

## 2021-04-24 NOTE — Progress Notes (Signed)
Physical Therapy Treatment Patient Details Name: Jennifer Calhoun MRN: [id number] DOB: 1941/07/10 Today's Date: 04/24/2021    History of Present Illness Pt is 80 yo female who presented on 5/30 with 2 wk h/o dizziness and some speech difficulties. CT head neg for acute event.  Had been in ED 2 wks prior also with dizziness as well as hyponatremia and blood in stool.  PMH: orthostatic hypotension, HLD, SBO x3, TIA.    PT Comments    Patient received in bed, she is agreeable to PT session. Wants to go home. She reports no dizziness at this time. Is independent with bed mobility, transfers and ambulation. No deficits noted. Patient will be safe to continue ambulation with nursing staff or by herself as desired until discharge. There is no longer a skilled need for PT. If dizziness episodes continue she may want to consider out patient PT follow up in the future. Will sign off.       Follow Up Recommendations  Outpatient PT     Equipment Recommendations  None recommended by PT    Recommendations for Other Services       Precautions / Restrictions Precautions Precaution Comments: low fall, no reported dizziness this session Restrictions Weight Bearing Restrictions: No    Mobility  Bed Mobility Overal bed mobility: Independent             General bed mobility comments: on initial attempt, patient standing in room preparing self for bath.    Transfers Overall transfer level: Independent Equipment used: None Transfers: Sit to/from Stand Sit to Stand: Independent            Ambulation/Gait Ambulation/Gait assistance: Supervision Gait Distance (Feet): 300 Feet Assistive device: None Gait Pattern/deviations: WFL(Within Functional Limits) Gait velocity: WFL   General Gait Details: no deficits noted   Stairs             Wheelchair Mobility    Modified Rankin (Stroke Patients Only)       Balance Overall balance assessment: Independent Sitting-balance  support: Feet supported Sitting balance-Leahy Scale: Normal     Standing balance support: No upper extremity supported;During functional activity Standing balance-Leahy Scale: Good                              Cognition Arousal/Alertness: Awake/alert Behavior During Therapy: WFL for tasks assessed/performed Overall Cognitive Status: Within Functional Limits for tasks assessed                                 General Comments: Pt conversational and appropriate throughout session.      Exercises      General Comments        Pertinent Vitals/Pain Pain Assessment: No/denies pain    Home Living                      Prior Function            PT Goals (current goals can now be found in the care plan section) Acute Rehab PT Goals Patient Stated Goal: to go home PT Goal Formulation: With patient Time For Goal Achievement: 05/07/21 Potential to Achieve Goals: Good Progress towards PT goals: Goals met/education completed, patient discharged from PT    Frequency    Min 3X/week      PT Plan Discharge plan needs to be updated    Co-evaluation  AM-PAC PT "6 Clicks" Mobility   Outcome Measure  Help needed turning from your back to your side while in a flat bed without using bedrails?: None Help needed moving from lying on your back to sitting on the side of a flat bed without using bedrails?: None Help needed moving to and from a bed to a chair (including a wheelchair)?: None Help needed standing up from a chair using your arms (e.g., wheelchair or bedside chair)?: None Help needed to walk in hospital room?: None Help needed climbing 3-5 steps with a railing? : A Little 6 Click Score: 23    End of Session Equipment Utilized During Treatment: Gait belt Activity Tolerance: Patient tolerated treatment well Patient left: with nursing/sitter in room;Other (comment) (patient left standing in room with technologist  present for procedure.) Nurse Communication: Mobility status PT Visit Diagnosis: Difficulty in walking, not elsewhere classified (R26.2)     Time: 8185-9093 PT Time Calculation (min) (ACUTE ONLY): 18 min  Charges:  $Gait Training: 8-22 mins                     Chyna Kneece, PT, GCS 04/24/21,12:39 PM

## 2021-04-24 NOTE — Progress Notes (Signed)
PROGRESS NOTE    Jennifer Calhoun  ZOX:[id number] DOB: 04/19/1941 DOA: 04/22/2021 PCP: Marva PandaMillsaps, Kimberly, NP   Brief Narrative:   Jennifer Calhoun is a 80 y.o. female with past medical history of orthostatic hypotension, hyperlipidemia presented to hospital with dizziness for 2 weeks.   Patient had been seen in the emergency department 2 weeks prior to this presentation and was noted to be hyponatremic.  She declined admission at that time and decided to follow-up with her primary care physician.  She was drinking a lot of water.  Her sodium had slightly improved but her dizziness persisted with slurred speech. She then decided to come to the hospital for further evaluation and treatment.   Assessment & Plan   Expressive aphasia, TIA vs Seizure Patient had episodes of dizziness and expressive aphasia with some speech abnormalities and movement of the body.  CT head was negative.  MRI was schertz stable chronic microvascular ischemic disease for her age.  MRA of the head was negative for large vessel occlusion but mild short segment stenosis of the proximal left M1 segment.  MRA of the neck showed short segment stenosis up to 30% in the right ICA.  EEG was obtained which showed the potential eliptogenicity arising from the left anterior temporal region.2 D echocardiogram shows an EF of 60 to 65%, grade 2 diastolic dysfunction.  Mild LVH.  Mild MV regurg. -LDL was 76, hemoglobin A1c was 5.7.  Patient has been seen by physical therapy occupational therapy who recommended home health PT on discharge.  Neurology was consulted and patient has been started on Keppra.  Continuous EEG has been started as well.  Hypertensive urgency -Patient with a history of orthostatic hypotension and uses TED hose at baseline.  Patient follows up with Dr. Jacinto HalimGanji cardiology..  She used to follow with Dr. Jacinto HalimGanji.  Initial blood pressure was elevated but latest blood pressure 128/51.  Mildly elevated BNP BNP on presentation  was 171.  Chest x-ray and 2D echo unremarkable.  Normocytic anemia -Patient admitted to having blood in her stool approximately 1 week ago but does have a history of diverticulosis. Hemoglobin at 10.2 at this time and is stable.   DVT Prophylaxis SCDs  Code Status: Full  Family Communication: Spoke with the patient's daughter at bedside  Disposition Plan:  Status is: Inpatient  Remains inpatient appropriate because:Ongoing diagnostic testing needed not appropriate for outpatient work up and Inpatient level of care appropriate due to severity of illness, continuous EEG.   Dispo: The patient is from: Home              Anticipated d/c is to: Home likely by tomorrow.              Patient currently is not medically stable to d/c.   Difficult to place patient No  Consultants Neurology  Procedures  EEG Echocardiogram Continuous EEG  Antibiotics   Anti-infectives (From admission, onward)   None      Subjective:   Today, patient was seen and examined at bedside.  Denies any nausea vomiting headache dizziness lightheadedness shortness of breath.  Speech has improved.  Objective:   Vitals:   04/23/21 2354 04/24/21 0326 04/24/21 0728 04/24/21 1142  BP: (!) 134/58 (!) 146/57 (!) 160/69 (!) 128/51  Pulse: 82 82 78 88  Resp: 19 20 18 18   Temp: 97.9 F (36.6 C) 97.8 F (36.6 C) 98.5 F (36.9 C) 98.1 F (36.7 C)  TempSrc: Oral Oral Oral Oral  SpO2: 98%  97% 100% 99%  Weight:      Height:       No intake or output data in the 24 hours ending 04/24/21 1258 Filed Weights   04/22/21 1152  Weight: 79.4 kg    Physical exam  General:  Average built, not in obvious distress HENT:   No scleral pallor or icterus noted. Oral mucosa is moist.  Chest:  Clear breath sounds.  Diminished breath sounds bilaterally. No crackles or wheezes.  CVS: S1 &S2 heard. No murmur.  Regular rate and rhythm. Abdomen: Soft, nontender, nondistended.  Bowel sounds are heard.   Extremities: No  cyanosis, clubbing or edema.  Peripheral pulses are palpable. Psych: Alert, awake and oriented, normal mood CNS:  No cranial nerve deficits.  Power equal in all extremities.   Skin: Warm and dry.  No rashes noted.   Data Reviewed: I have personally reviewed following labs and imaging studies  CBC: Recent Labs  Lab 04/22/21 1238 04/24/21 0315  WBC 3.5* 3.9*  HGB 11.0* 10.2*  HCT 35.1* 30.8*  MCV 96.4 90.6  PLT 224 221   Basic Metabolic Panel: Recent Labs  Lab 04/22/21 1238 04/24/21 0315  NA 137 136  K 3.6 3.5  CL 107 99  CO2 23 28  GLUCOSE 88 111*  BUN <5* 5*  CREATININE 1.13* 0.97  CALCIUM 9.1 9.2   GFR: Estimated Creatinine Clearance: 47.7 mL/min (by C-G formula based on SCr of 0.97 mg/dL). Liver Function Tests: No results for input(s): AST, ALT, ALKPHOS, BILITOT, PROT, ALBUMIN in the last 168 hours. No results for input(s): LIPASE, AMYLASE in the last 168 hours. No results for input(s): AMMONIA in the last 168 hours. Coagulation Profile: No results for input(s): INR, PROTIME in the last 168 hours. Cardiac Enzymes: Recent Labs  Lab 04/23/21 0250  CKTOTAL 73   BNP (last 3 results) No results for input(s): PROBNP in the last 8760 hours. HbA1C: Recent Labs    04/23/21 0250  HGBA1C 5.7*   CBG: Recent Labs  Lab 04/22/21 1216  GLUCAP 91   Lipid Profile: Recent Labs    04/23/21 0250  CHOL 136  HDL 50  LDLCALC 76  TRIG 51  CHOLHDL 2.7   Thyroid Function Tests: No results for input(s): TSH, T4TOTAL, FREET4, T3FREE, THYROIDAB in the last 72 hours. Anemia Panel: No results for input(s): VITAMINB12, FOLATE, FERRITIN, TIBC, IRON, RETICCTPCT in the last 72 hours. Urine analysis:    Component Value Date/Time   COLORURINE STRAW (A) 04/22/2021 1204   APPEARANCEUR CLEAR 04/22/2021 1204   LABSPEC 1.005 04/22/2021 1204   PHURINE 6.0 04/22/2021 1204   GLUCOSEU NEGATIVE 04/22/2021 1204   HGBUR NEGATIVE 04/22/2021 1204   BILIRUBINUR NEGATIVE 04/22/2021  1204   KETONESUR NEGATIVE 04/22/2021 1204   PROTEINUR NEGATIVE 04/22/2021 1204   UROBILINOGEN 0.2 05/15/2011 1907   NITRITE NEGATIVE 04/22/2021 1204   LEUKOCYTESUR LARGE (A) 04/22/2021 1204   Sepsis Labs: @LABRCNTIP (procalcitonin:4,lacticidven:4)  ) Recent Results (from the past 240 hour(s))  Resp Panel by RT-PCR (Flu A&B, Covid) Nasopharyngeal Swab     Status: None   Collection Time: 04/22/21  3:58 PM   Specimen: Nasopharyngeal Swab; Nasopharyngeal(NP) swabs in vial transport medium  Result Value Ref Range Status   SARS Coronavirus 2 by RT PCR NEGATIVE NEGATIVE Final    Comment: (NOTE) SARS-CoV-2 target nucleic acids are NOT DETECTED.  The SARS-CoV-2 RNA is generally detectable in upper respiratory specimens during the acute phase of infection. The lowest concentration of SARS-CoV-2 viral copies this  assay can detect is 138 copies/mL. A negative result does not preclude SARS-Cov-2 infection and should not be used as the sole basis for treatment or other patient management decisions. A negative result may occur with  improper specimen collection/handling, submission of specimen other than nasopharyngeal swab, presence of viral mutation(s) within the areas targeted by this assay, and inadequate number of viral copies(<138 copies/mL). A negative result must be combined with clinical observations, patient history, and epidemiological information. The expected result is Negative.  Fact Sheet for Patients:  BloggerCourse.com  Fact Sheet for Healthcare Providers:  SeriousBroker.it  This test is no t yet approved or cleared by the [place] FDA and  has been authorized for detection and/or diagnosis of SARS-CoV-2 by FDA under an Emergency Use Authorization (EUA). This EUA will remain  in effect (meaning this test can be used) for the duration of the COVID-19 declaration under Section 564(b)(1) of the Act, 21 U.S.C.section  360bbb-3(b)(1), unless the authorization is terminated  or revoked sooner.       Influenza A by PCR NEGATIVE NEGATIVE Final   Influenza B by PCR NEGATIVE NEGATIVE Final    Comment: (NOTE) The Xpert Xpress SARS-CoV-2/FLU/RSV plus assay is intended as an aid in the diagnosis of influenza from Nasopharyngeal swab specimens and should not be used as a sole basis for treatment. Nasal washings and aspirates are unacceptable for Xpert Xpress SARS-CoV-2/FLU/RSV testing.  Fact Sheet for Patients: BloggerCourse.com  Fact Sheet for Healthcare Providers: SeriousBroker.it  This test is not yet approved or cleared by the [place] FDA and has been authorized for detection and/or diagnosis of SARS-CoV-2 by FDA under an Emergency Use Authorization (EUA). This EUA will remain in effect (meaning this test can be used) for the duration of the COVID-19 declaration under Section 564(b)(1) of the Act, 21 U.S.C. section 360bbb-3(b)(1), unless the authorization is terminated or revoked.  Performed at [facility], [address], [place], [place] [address]       Radiology Studies: CT ANGIO HEAD NECK W WO CM  Result Date: 04/23/2021 CLINICAL DATA:  Stroke follow-up EXAM: CT ANGIOGRAPHY HEAD AND NECK TECHNIQUE: Multidetector CT imaging of the head and neck was performed using the standard protocol during bolus administration of intravenous contrast. Multiplanar CT image reconstructions and MIPs were obtained to evaluate the vascular anatomy. Carotid stenosis measurements (when applicable) are obtained utilizing NASCET criteria, using the distal internal carotid diameter as the denominator. CONTRAST:  75mL OMNIPAQUE IOHEXOL 350 MG/ML SOLN COMPARISON:  CT head 04/22/2021.  MRI head 04/22/2021 FINDINGS: CT HEAD FINDINGS Brain: No evidence of acute infarction, hemorrhage, hydrocephalus, extra-axial collection or mass lesion/mass effect.  Mild patchy white matter hypodensity bilaterally. Vascular: Negative for hyperdense vessel Skull: Negative Sinuses: Paranasal sinuses clear. Orbits: Bilateral cataract extraction Review of the MIP images confirms the above findings CTA NECK FINDINGS Aortic arch: Atherosclerotic aortic arch. Proximal great vessels widely patent without stenosis. Bovine branching arch. Right carotid system: Noncalcified plaque right carotid bulb with less than 25% diameter stenosis of the right internal carotid artery. Right external carotid artery widely patent. Left carotid system: Widely patent bifurcation. Negative for stenosis. Vertebral arteries: Right vertebral artery dominant. Both vertebral arteries patent to the basilar without significant stenosis. Skeleton: Cervical spondylosis.  No acute skeletal abnormality. Other neck: Negative for soft tissue mass or edema. Subcentimeter thyroid nodules bilaterally. No further imaging necessary. Upper chest: Lung apices clear bilaterally. Review of the MIP images confirms the above findings CTA HEAD FINDINGS Anterior circulation: Mild  atherosclerotic calcification in the cavernous carotid bilaterally without stenosis. Anterior and middle cerebral arteries patent bilaterally without stenosis or large vessel occlusion. Posterior circulation: Both vertebral arteries patent to the basilar. Hypoplastic distal left vertebral artery. PICA patent bilaterally. Atherosclerotic disease in the basilar with mild stenosis. Anterior and middle cerebral arteries patent bilaterally without stenosis. Fetal origin right posterior cerebral artery. Venous sinuses: Normal venous enhancement Anatomic variants: None Review of the MIP images confirms the above findings IMPRESSION: 1. No significant intracranial stenosis or large vessel occlusion. 2. No significant carotid or vertebral artery stenosis in the neck. 3. CT head negative for acute abnormality. Electronically Signed   By: Marlan Palau M.D.   On:  04/23/2021 17:52   CT Head Wo Contrast  Result Date: 04/22/2021 CLINICAL DATA:  Difficulty forming words and dizziness. EXAM: CT HEAD WITHOUT CONTRAST TECHNIQUE: Contiguous axial images were obtained from the base of the skull through the vertex without intravenous contrast. COMPARISON:  May 30, 2002 FINDINGS: Brain: No evidence of acute infarction, hemorrhage, hydrocephalus, extra-axial collection or mass lesion/mass effect. Mild brain parenchymal volume loss and deep white matter microangiopathy. Vascular: No hyperdense vessel or unexpected calcification. Skull: Normal. Negative for fracture or focal lesion. Sinuses/Orbits: No acute finding. Other: None. IMPRESSION: 1. No acute intracranial abnormality. 2. Mild brain parenchymal atrophy and chronic microvascular disease. Electronically Signed   By: Ted Mcalpine M.D.   On: 04/22/2021 13:23   MR ANGIO HEAD WO CONTRAST  Result Date: 04/22/2021 CLINICAL DATA:  Initial evaluation for acute TIA. EXAM: MRI HEAD WITHOUT CONTRAST MRA HEAD WITHOUT CONTRAST MRA NECK WITHOUT AND WITH CONTRAST TECHNIQUE: Multiplanar, multi-echo pulse sequences of the brain and surrounding structures were acquired without intravenous contrast. Angiographic images of the Circle of Willis were acquired using MRA technique without intravenous contrast. Angiographic images of the neck were acquired using MRA technique without and with intravenous contrast. Carotid stenosis measurements (when applicable) are obtained utilizing NASCET criteria, using the distal internal carotid diameter as the denominator. CONTRAST:  1mL GADAVIST GADOBUTROL 1 MMOL/ML IV SOLN COMPARISON:  Prior CT from earlier the same day. FINDINGS: MRI HEAD FINDINGS Brain: Cerebral volume within normal limits for patient age. Scattered patchy T2/FLAIR hyperintensity within the periventricular, deep, and subcortical white matter both cerebral hemispheres, most consistent with chronic small vessel ischemic disease, mild  for age. No abnormal foci of restricted diffusion to suggest acute or subacute ischemia. Gray-white matter differentiation maintained. No encephalomalacia to suggest chronic cortical infarction. No evidence for acute or chronic intracranial hemorrhage. No mass lesion, midline shift or mass effect no hydrocephalus or extra-axial fluid collection. Pituitary gland suprasellar region normal. Midline structures intact. Vascular: Major intracranial vascular flow voids are maintained. Skull and upper cervical spine: Craniocervical junction within normal limits. Bone marrow signal intensity normal. No scalp soft tissue abnormality. Sinuses/Orbits: Patient status post bilateral ocular lens replacement. Paranasal sinuses are clear. No significant mastoid effusion. Inner ear structures grossly normal. Other: None. MRA HEAD FINDINGS ANTERIOR CIRCULATION: Visualized distal cervical segments of the internal carotid arteries are patent with antegrade flow. Petrous, cavernous, and supraclinoid segments patent without stenosis or other abnormality. A1 segments patent. Right A1 hypoplastic. Normal anterior communicating artery complex. Anterior cerebral arteries patent to their distal aspects without stenosis. Possible short-segment fenestration at the proximal left A2 segment noted. Right M1 widely patent. Normal right MCA bifurcation. Distal right MCA branches well perfused. Atheromatous irregularity with suspected mild short-segment stenosis at the proximal left M1 segment (series 12, image 6). Normal left MCA bifurcation.  Distal left MCA branches well perfused. POSTERIOR CIRCULATION: Both V4 segments patent to the vertebrobasilar junction without stenosis. Right vertebral artery dominant. Both PICA patent. Basilar patent to its distal aspect without stenosis. Superior cerebellar arteries patent bilaterally. Left PCA supplied via the basilar as well as a small left posterior communicating artery. Fetal type origin of the right  PCA. Both PCAs perfused to their distal aspects without stenosis. No aneurysm. MRA NECK FINDINGS AORTIC ARCH: Visualized aortic arch normal in caliber with normal branch pattern. No hemodynamically significant stenosis seen about the origin of the great vessels. RIGHT CAROTID SYSTEM: Visualized right CCA patent to the bifurcation without stenosis. Atheromatous irregularity about the right carotid bulb/proximal right ICA with associated short-segment stenosis of up to 30% by NASCET criteria. Right ICA patent distally without stenosis, evidence for dissection or occlusion. LEFT CAROTID SYSTEM: Left CCA patent from its origin to the bifurcation without stenosis. No significant atheromatous irregularity or narrowing about the left bifurcation. Left ICA patent distally without stenosis, evidence for dissection or occlusion. VERTEBRAL ARTERIES: Both vertebral arteries arise from subclavian arteries. Right vertebral artery dominant. No visible proximal subclavian artery stenosis on this motion degraded exam. Proximal left vertebral artery somewhat limited assessment due to adjacent venous contamination. Visualized portions of the vertebral arteries patent without stenosis, evidence for dissection or occlusion. IMPRESSION: MRI HEAD IMPRESSION: 1. No acute intracranial abnormality. 2. Mild chronic microvascular ischemic disease for age. MRA HEAD IMPRESSION: 1. Negative intracranial MRA for large vessel occlusion. 2. Mild intracranial atherosclerotic change for age with associated mild short-segment stenosis of the proximal left M1 segment. No other hemodynamically significant or correctable stenosis. MRA NECK IMPRESSION: 1. Short-segment mild stenosis of up to 30% by NASCET criteria involving the origin/proximal right ICA. 2. No hemodynamically significant stenosis identified within the left carotid artery system. 3. Patent vertebral arteries within the neck without stenosis. Right vertebral artery dominant. Electronically  Signed   By: Rise Mu M.D.   On: 04/22/2021 20:10   MR ANGIO NECK W WO CONTRAST  Result Date: 04/22/2021 CLINICAL DATA:  Initial evaluation for acute TIA. EXAM: MRI HEAD WITHOUT CONTRAST MRA HEAD WITHOUT CONTRAST MRA NECK WITHOUT AND WITH CONTRAST TECHNIQUE: Multiplanar, multi-echo pulse sequences of the brain and surrounding structures were acquired without intravenous contrast. Angiographic images of the Circle of Willis were acquired using MRA technique without intravenous contrast. Angiographic images of the neck were acquired using MRA technique without and with intravenous contrast. Carotid stenosis measurements (when applicable) are obtained utilizing NASCET criteria, using the distal internal carotid diameter as the denominator. CONTRAST:  8mL GADAVIST GADOBUTROL 1 MMOL/ML IV SOLN COMPARISON:  Prior CT from earlier the same day. FINDINGS: MRI HEAD FINDINGS Brain: Cerebral volume within normal limits for patient age. Scattered patchy T2/FLAIR hyperintensity within the periventricular, deep, and subcortical white matter both cerebral hemispheres, most consistent with chronic small vessel ischemic disease, mild for age. No abnormal foci of restricted diffusion to suggest acute or subacute ischemia. Gray-white matter differentiation maintained. No encephalomalacia to suggest chronic cortical infarction. No evidence for acute or chronic intracranial hemorrhage. No mass lesion, midline shift or mass effect no hydrocephalus or extra-axial fluid collection. Pituitary gland suprasellar region normal. Midline structures intact. Vascular: Major intracranial vascular flow voids are maintained. Skull and upper cervical spine: Craniocervical junction within normal limits. Bone marrow signal intensity normal. No scalp soft tissue abnormality. Sinuses/Orbits: Patient status post bilateral ocular lens replacement. Paranasal sinuses are clear. No significant mastoid effusion. Inner ear structures grossly  normal. Other:  None. MRA HEAD FINDINGS ANTERIOR CIRCULATION: Visualized distal cervical segments of the internal carotid arteries are patent with antegrade flow. Petrous, cavernous, and supraclinoid segments patent without stenosis or other abnormality. A1 segments patent. Right A1 hypoplastic. Normal anterior communicating artery complex. Anterior cerebral arteries patent to their distal aspects without stenosis. Possible short-segment fenestration at the proximal left A2 segment noted. Right M1 widely patent. Normal right MCA bifurcation. Distal right MCA branches well perfused. Atheromatous irregularity with suspected mild short-segment stenosis at the proximal left M1 segment (series 12, image 6). Normal left MCA bifurcation. Distal left MCA branches well perfused. POSTERIOR CIRCULATION: Both V4 segments patent to the vertebrobasilar junction without stenosis. Right vertebral artery dominant. Both PICA patent. Basilar patent to its distal aspect without stenosis. Superior cerebellar arteries patent bilaterally. Left PCA supplied via the basilar as well as a small left posterior communicating artery. Fetal type origin of the right PCA. Both PCAs perfused to their distal aspects without stenosis. No aneurysm. MRA NECK FINDINGS AORTIC ARCH: Visualized aortic arch normal in caliber with normal branch pattern. No hemodynamically significant stenosis seen about the origin of the great vessels. RIGHT CAROTID SYSTEM: Visualized right CCA patent to the bifurcation without stenosis. Atheromatous irregularity about the right carotid bulb/proximal right ICA with associated short-segment stenosis of up to 30% by NASCET criteria. Right ICA patent distally without stenosis, evidence for dissection or occlusion. LEFT CAROTID SYSTEM: Left CCA patent from its origin to the bifurcation without stenosis. No significant atheromatous irregularity or narrowing about the left bifurcation. Left ICA patent distally without stenosis,  evidence for dissection or occlusion. VERTEBRAL ARTERIES: Both vertebral arteries arise from subclavian arteries. Right vertebral artery dominant. No visible proximal subclavian artery stenosis on this motion degraded exam. Proximal left vertebral artery somewhat limited assessment due to adjacent venous contamination. Visualized portions of the vertebral arteries patent without stenosis, evidence for dissection or occlusion. IMPRESSION: MRI HEAD IMPRESSION: 1. No acute intracranial abnormality. 2. Mild chronic microvascular ischemic disease for age. MRA HEAD IMPRESSION: 1. Negative intracranial MRA for large vessel occlusion. 2. Mild intracranial atherosclerotic change for age with associated mild short-segment stenosis of the proximal left M1 segment. No other hemodynamically significant or correctable stenosis. MRA NECK IMPRESSION: 1. Short-segment mild stenosis of up to 30% by NASCET criteria involving the origin/proximal right ICA. 2. No hemodynamically significant stenosis identified within the left carotid artery system. 3. Patent vertebral arteries within the neck without stenosis. Right vertebral artery dominant. Electronically Signed   By: Rise Mu M.D.   On: 04/22/2021 20:10   MR BRAIN WO CONTRAST  Result Date: 04/22/2021 CLINICAL DATA:  Initial evaluation for acute TIA. EXAM: MRI HEAD WITHOUT CONTRAST MRA HEAD WITHOUT CONTRAST MRA NECK WITHOUT AND WITH CONTRAST TECHNIQUE: Multiplanar, multi-echo pulse sequences of the brain and surrounding structures were acquired without intravenous contrast. Angiographic images of the Circle of Willis were acquired using MRA technique without intravenous contrast. Angiographic images of the neck were acquired using MRA technique without and with intravenous contrast. Carotid stenosis measurements (when applicable) are obtained utilizing NASCET criteria, using the distal internal carotid diameter as the denominator. CONTRAST:  8mL GADAVIST GADOBUTROL 1  MMOL/ML IV SOLN COMPARISON:  Prior CT from earlier the same day. FINDINGS: MRI HEAD FINDINGS Brain: Cerebral volume within normal limits for patient age. Scattered patchy T2/FLAIR hyperintensity within the periventricular, deep, and subcortical white matter both cerebral hemispheres, most consistent with chronic small vessel ischemic disease, mild for age. No abnormal foci of restricted diffusion to suggest acute  or subacute ischemia. Gray-white matter differentiation maintained. No encephalomalacia to suggest chronic cortical infarction. No evidence for acute or chronic intracranial hemorrhage. No mass lesion, midline shift or mass effect no hydrocephalus or extra-axial fluid collection. Pituitary gland suprasellar region normal. Midline structures intact. Vascular: Major intracranial vascular flow voids are maintained. Skull and upper cervical spine: Craniocervical junction within normal limits. Bone marrow signal intensity normal. No scalp soft tissue abnormality. Sinuses/Orbits: Patient status post bilateral ocular lens replacement. Paranasal sinuses are clear. No significant mastoid effusion. Inner ear structures grossly normal. Other: None. MRA HEAD FINDINGS ANTERIOR CIRCULATION: Visualized distal cervical segments of the internal carotid arteries are patent with antegrade flow. Petrous, cavernous, and supraclinoid segments patent without stenosis or other abnormality. A1 segments patent. Right A1 hypoplastic. Normal anterior communicating artery complex. Anterior cerebral arteries patent to their distal aspects without stenosis. Possible short-segment fenestration at the proximal left A2 segment noted. Right M1 widely patent. Normal right MCA bifurcation. Distal right MCA branches well perfused. Atheromatous irregularity with suspected mild short-segment stenosis at the proximal left M1 segment (series 12, image 6). Normal left MCA bifurcation. Distal left MCA branches well perfused. POSTERIOR CIRCULATION:  Both V4 segments patent to the vertebrobasilar junction without stenosis. Right vertebral artery dominant. Both PICA patent. Basilar patent to its distal aspect without stenosis. Superior cerebellar arteries patent bilaterally. Left PCA supplied via the basilar as well as a small left posterior communicating artery. Fetal type origin of the right PCA. Both PCAs perfused to their distal aspects without stenosis. No aneurysm. MRA NECK FINDINGS AORTIC ARCH: Visualized aortic arch normal in caliber with normal branch pattern. No hemodynamically significant stenosis seen about the origin of the great vessels. RIGHT CAROTID SYSTEM: Visualized right CCA patent to the bifurcation without stenosis. Atheromatous irregularity about the right carotid bulb/proximal right ICA with associated short-segment stenosis of up to 30% by NASCET criteria. Right ICA patent distally without stenosis, evidence for dissection or occlusion. LEFT CAROTID SYSTEM: Left CCA patent from its origin to the bifurcation without stenosis. No significant atheromatous irregularity or narrowing about the left bifurcation. Left ICA patent distally without stenosis, evidence for dissection or occlusion. VERTEBRAL ARTERIES: Both vertebral arteries arise from subclavian arteries. Right vertebral artery dominant. No visible proximal subclavian artery stenosis on this motion degraded exam. Proximal left vertebral artery somewhat limited assessment due to adjacent venous contamination. Visualized portions of the vertebral arteries patent without stenosis, evidence for dissection or occlusion. IMPRESSION: MRI HEAD IMPRESSION: 1. No acute intracranial abnormality. 2. Mild chronic microvascular ischemic disease for age. MRA HEAD IMPRESSION: 1. Negative intracranial MRA for large vessel occlusion. 2. Mild intracranial atherosclerotic change for age with associated mild short-segment stenosis of the proximal left M1 segment. No other hemodynamically significant or  correctable stenosis. MRA NECK IMPRESSION: 1. Short-segment mild stenosis of up to 30% by NASCET criteria involving the origin/proximal right ICA. 2. No hemodynamically significant stenosis identified within the left carotid artery system. 3. Patent vertebral arteries within the neck without stenosis. Right vertebral artery dominant. Electronically Signed   By: Rise Mu M.D.   On: 04/22/2021 20:10   DG CHEST PORT 1 VIEW  Result Date: 04/22/2021 CLINICAL DATA:  Shortness of breath and dizziness EXAM: PORTABLE CHEST 1 VIEW COMPARISON:  06/25/2016 FINDINGS: Cardiac shadow is mildly prominent but stable. Aortic calcifications are again seen. Calcifications are noted in the breasts bilaterally stable in appearance from prior exam and shown to be within the breasts on prior CT. No bony abnormality is seen. No focal  infiltrate is noted. IMPRESSION: No active disease. Electronically Signed   By: Alcide Clever M.D.   On: 04/22/2021 17:29   EEG adult  Result Date: 04/23/2021 Charlsie Quest, MD     04/23/2021 12:03 PM Patient Name: TOY SAMARIN MRN: [id number] Epilepsy Attending: Charlsie Quest Referring Physician/Provider: Dr Caryl Pina Date: 04/23/2021 Duration: 22.40 mins Patient history: 80 year old female with intermittent spells of stuttering speech. EEG to evaluate for seizure. Level of alertness: Awake, asleep AEDs during EEG study: LEV Technical aspects: This EEG study was done with scalp electrodes positioned according to the 10-20 International system of electrode placement. Electrical activity was acquired at a sampling rate of 500Hz  and reviewed with a high frequency filter of 70Hz  and a low frequency filter of 1Hz . EEG data were recorded continuously and digitally stored. Description: The posterior dominant rhythm consists of 8-9 Hz activity of moderate voltage (25-35 uV) seen predominantly in posterior head regions, symmetric and reactive to eye opening and eye closing. Sleep was  characterized by vertex waves, sleep spindles (12 to 14 Hz), maximal frontocentral region. Spike was noted in left  anterior temporal region. Hyperventilation and photic stimulation were not performed.   ABNORMALITY -Spike, left  anterior temporal region IMPRESSION: This study is showed evidence of potential epileptogenicity arising from left  anterior temporal region. No seizures were seen throughout the recording.   ECHOCARDIOGRAM COMPLETE  Result Date: 04/23/2021    ECHOCARDIOGRAM REPORT   Patient Name:   KELLIANNE EK Date of Exam: 04/23/2021 Medical Rec #:  04/25/2021        Height:       63.0 in Accession #:    [id number] Hazel       Weight:       175.0 lb Date of Birth:  08/01/1941        BSA:          1.827 m Patient Age:    79 years         BP:           150/58 mmHg Patient Gender: F                HR:           80 bpm. Exam Location:  Inpatient Procedure: 2D Echo, Cardiac Doppler and Color Doppler Indications:    TIA  History:        Patient has no prior history of Echocardiogram examinations.                 TIA; Risk Factors:Hypertension.  Sonographer:    [id number] Referring Phys: [id number] [facility] [place]IL IMPRESSIONS  1. Left ventricular ejection fraction, by estimation, is 60 to 65%. The left ventricle has normal function. The left ventricle has no regional wall motion abnormalities. There is mild left ventricular hypertrophy. Left ventricular diastolic parameters are consistent with Grade II diastolic dysfunction (pseudonormalization). Elevated left atrial pressure.  2. Right ventricular systolic function is normal. The right ventricular size is normal. There is mildly elevated pulmonary artery systolic pressure. The estimated right ventricular systolic pressure is 35.5 mmHg.  3. The mitral valve is degenerative. Mild mitral valve regurgitation.  4. The aortic valve is tricuspid. There is moderate calcification of the aortic valve. Aortic valve regurgitation is not visualized.  Mild to moderate aortic valve stenosis. Vmax 2.3 m/s, MG 12 mmHg, AVA 1.1 cm^2, DI 0.4.  5. The inferior vena cava is normal in size with greater  than 50% respiratory variability, suggesting right atrial pressure of 3 mmHg. FINDINGS  Left Ventricle: Left ventricular ejection fraction, by estimation, is 60 to 65%. The left ventricle has normal function. The left ventricle has no regional wall motion abnormalities. The left ventricular internal cavity size was normal in size. There is  mild left ventricular hypertrophy. Left ventricular diastolic parameters are consistent with Grade II diastolic dysfunction (pseudonormalization). Elevated left atrial pressure. Right Ventricle: The right ventricular size is normal. No increase in right ventricular wall thickness. Right ventricular systolic function is normal. There is mildly elevated pulmonary artery systolic pressure. The tricuspid regurgitant velocity is 2.85  m/s, and with an assumed right atrial pressure of 3 mmHg, the estimated right ventricular systolic pressure is 35.5 mmHg. Left [facility]: Left atrial size was normal in size. Right [facility]: Right atrial size was normal in size. Pericardium: There is no evidence of pericardial effusion. Mitral Valve: The mitral valve is degenerative in appearance. Mild mitral valve regurgitation. Tricuspid Valve: The tricuspid valve is normal in structure. Tricuspid valve regurgitation is trivial. Aortic Valve: The aortic valve is tricuspid. There is moderate calcification of the aortic valve. Aortic valve regurgitation is not visualized. Mild to moderate aortic stenosis is present. Aortic valve mean gradient measures 10.0 mmHg. Aortic valve peak gradient measures 17.3 mmHg. Aortic valve area, by VTI measures 1.40 cm. Pulmonic Valve: The pulmonic valve was not well visualized. Pulmonic valve regurgitation is mild. Aorta: The aortic root and ascending aorta are structurally normal, with no evidence of dilitation. Venous: The  inferior vena cava is normal in size with greater than 50% respiratory variability, suggesting right atrial pressure of 3 mmHg. IAS/Shunts: The interatrial septum was not well visualized.  LEFT VENTRICLE PLAX 2D LVIDd:         4.30 cm  Diastology LVIDs:         2.90 cm  LV e' medial:    5.11 cm/s LV PW:         1.00 cm  LV E/e' medial:  29.5 LV IVS:        0.90 cm  LV e' lateral:   7.72 cm/s LVOT diam:     1.90 cm  LV E/e' lateral: 19.6 LV SV:         60 LV SV Index:   33 LVOT Area:     2.84 cm  RIGHT VENTRICLE             IVC RV S prime:     12.70 cm/s  IVC diam: 1.20 cm TAPSE (M-mode): 2.9 cm LEFT ATRIUM             Index       RIGHT ATRIUM           Index LA diam:        3.10 cm 1.70 cm/m  RA Area:     10.40 cm LA Vol (A2C):   53.0 ml 29.01 ml/m RA Volume:   18.40 ml  10.07 ml/m LA Vol (A4C):   52.0 ml 28.49 ml/m LA Biplane Vol: 52.6 ml 28.79 ml/m  AORTIC VALVE AV Area (Vmax):    1.39 cm AV Area (Vmean):   1.36 cm AV Area (VTI):     1.40 cm AV Vmax:           208.00 cm/s AV Vmean:          144.000 cm/s AV VTI:            0.429 m AV Peak  Grad:      17.3 mmHg AV Mean Grad:      10.0 mmHg LVOT Vmax:         102.00 cm/s LVOT Vmean:        68.900 cm/s LVOT VTI:          0.212 m LVOT/AV VTI ratio: 0.49  AORTA Ao Root diam: 2.70 cm MITRAL VALVE                TRICUSPID VALVE MV Area (PHT): 4.60 cm     TR Peak grad:   32.5 mmHg MV Decel Time: 165 msec     TR Vmax:        285.00 cm/s MV E velocity: 151.00 cm/s MV A velocity: 168.00 cm/s  SHUNTS MV E/A ratio:  0.90         Systemic VTI:  0.21 m                             Systemic Diam: 1.90 cm Epifanio Lesches MD Electronically signed by Epifanio Lesches MD Signature Date/Time: 04/23/2021/12:12:29 PM    Final      Scheduled Meds: . aspirin EC  325 mg Oral Daily  . atorvastatin  20 mg Oral Daily  . cycloSPORINE  1 drop Both Eyes BID   Continuous Infusions:   LOS: 2 days    Joycelyn Das MD. on 04/24/2021 at 12:58 PM [facility] Office  [phone]

## 2021-04-24 NOTE — Progress Notes (Addendum)
Subjective: No seizure-like episodes overnight.  Patient states she has had episodes of lightheadedness on and off and has a diagnosis of orthostatic hypotension.  She thinks the episode over the weekend where she was feeling dizzy was similar to her previous episodes.  She also states that the speech disturbance was more like her taking pauses rather than confusion or any other speech disturbance.  Denies ever having seizures, aura, any family history of epilepsy.  ROS: negative except above  Examination  Vital signs in last 24 hours: Temp:  [97.8 F (36.6 C)-98.5 F (36.9 C)] 98.5 F (36.9 C) (06/01 0728) Pulse Rate:  [78-91] 78 (06/01 0728) Resp:  [18-22] 18 (06/01 0728) BP: (119-183)/(52-86) 160/69 (06/01 0728) SpO2:  [97 %-100 %] 100 % (06/01 0728)  General: lying in bed, not in apparent distress CVS: pulse-normal rate and rhythm RS: breathing comfortably, CTA B Extremities: normal, warm Neuro: AOx3, cranial nerves II through XII grossly intact, 5/5 in all 4 extremities, FTN intact  Basic Metabolic Panel: Recent Labs  Lab 04/22/21 1238 04/24/21 0315  NA 137 136  K 3.6 3.5  CL 107 99  CO2 23 28  GLUCOSE 88 111*  BUN <5* 5*  CREATININE 1.13* 0.97  CALCIUM 9.1 9.2    CBC: Recent Labs  Lab 04/22/21 1238 04/24/21 0315  WBC 3.5* 3.9*  HGB 11.0* 10.2*  HCT 35.1* 30.8*  MCV 96.4 90.6  PLT 224 221     Coagulation Studies: No results for input(s): LABPROT, INR in the last 72 hours.  Imaging MRI brain without contrast 04/22/2021: No acute intracranial abnormality  MRA head and neck 04/22/2021: No hemodynamically significant stenosis  CTA head and neck 04/23/2021: No hemodynamically significant stenosis  ASSESSMENT AND PLAN: 80 year old female who presented on 04/22/2021 with intermittent difficulty forming words, dizziness/lightheadedness and dyspnea on exertion.  Transient speech difficulties Lightheadedness -EEG showed single spike in left anterior temporal  region concerning for epilepsy  Recommendation -It is possible that patient's episodes were related to orthostatic hypotension.  Patient is hesitant about starting any medication but is willing to take it if absolutely needed -On review of routine EEG again, there was single spike noted. -Discussed with patient to obtain 24-hour video EEG to better evaluate for ictal-interictal abnormalities -Depending on the EEG findings, we will have further discussion with patient tomorrow regarding starting AEDs versus holding off -Continue seizure precaution  -Management of rest of comorbidities per primary team  I have spent a total of 35  minutes with the patient reviewing hospital notes,  test results, labs and examining the patient as well as establishing an assessment and plan that was discussed personally with the patient.  > 50% of time was spent in direct patient care.   Lindie Spruce Epilepsy [facility] For questions after 5pm please refer to AMION to reach the Neurologist on call

## 2021-04-24 NOTE — Progress Notes (Signed)
cEEG study strated.  Called [facility] moitoring.  Checked event button.

## 2021-04-25 DIAGNOSIS — I10 Essential (primary) hypertension: Secondary | ICD-10-CM | POA: Diagnosis not present

## 2021-04-25 DIAGNOSIS — R569 Unspecified convulsions: Secondary | ICD-10-CM

## 2021-04-25 DIAGNOSIS — G40909 Epilepsy, unspecified, not intractable, without status epilepticus: Secondary | ICD-10-CM

## 2021-04-25 DIAGNOSIS — R4701 Aphasia: Secondary | ICD-10-CM | POA: Diagnosis not present

## 2021-04-25 MED ORDER — LEVETIRACETAM 500 MG PO TABS: 500.0000 mg | ORAL_TABLET | Freq: Two times a day (BID) | ORAL | 2 refills | Status: DC

## 2021-04-25 MED ORDER — ASPIRIN EC 81 MG PO TBEC: 81.0000 mg | DELAYED_RELEASE_TABLET | Freq: Every day | ORAL | 3 refills | Status: AC

## 2021-04-25 MED ORDER — ATORVASTATIN CALCIUM 10 MG PO TABS: 20.0000 mg | ORAL_TABLET | Freq: Every day | ORAL | 2 refills | Status: AC

## 2021-04-25 NOTE — Progress Notes (Signed)
Pt safely discharged. Discharge packet provided with teach-back method. VS wnL and as per flow. IVs removed, Pt verbalized understanding. All questions and concerns addressed. Pt brought down to main entrance. Daughter at pickup.

## 2021-04-25 NOTE — Discharge Summary (Signed)
Physician Discharge Summary  Jennifer Calhoun ZOX:[id number] DOB: [birth year] DOA: 04/22/2021  PCP: Marva Panda, NP  Admit date: 04/22/2021 Discharge date: 04/25/2021  Admitted From: Home  Discharge disposition: home   Recommendations for Outpatient Follow-Up:   . Follow up with your primary care provider in one week.  . Check CBC, BMP, magnesium in the next visit . Follow-up with [facility] in 3 months.  Discharge Diagnosis:    Discharge Condition: Improved.  Diet recommendation: Low sodium, heart healthy.    Wound care: None.  Code status: Full.   History of Present Illness:   Jennifer Calhoun a 80 y.o.femalewith past medical history of orthostatic hypotension, hyperlipidemia presented to hospital with dizziness for 2 weeks.   Patient had been seen in the emergency department 2 weeks prior to this presentation and was noted to be hyponatremic.  She declined admission at that time and decided to follow-up with her primary care physician.  She was drinking a lot of water.  Her sodium had slightly improved but her dizziness persisted with slurred speech. She then decided to come to the hospital for further evaluation and treatment.    Hospital Course:   Following conditions were addressed during hospitalization as listed below,  Expressive aphasia, likely secondary to epilepsy. Patient had episodes of dizziness and expressive aphasia with some speech abnormalities and movement of the body.  CT head was negative.  MRI was showed a stable chronic microvascular ischemic disease for her age.  MRA of the head was negative for large vessel occlusion but mild short segment stenosis of the proximal left M1 segment.  MRA of the neck showed short segment stenosis up to 30% in the right ICA.  EEG was obtained which showed the potential eliptogenicity arising from the left anterior temporal region.2 D echocardiogram shows an EF of 60 to 65%, grade 2 diastolic  dysfunction.  Mild LVH.  Mild MV regurg. -LDL was 76, hemoglobin A1c was 5.7.  Patient has been seen by physical therapy occupational therapy who recommended home health PT on discharge.  Neurology was consulted and patient has been started on Keppra.  Continuous EEG was done.  Neurology had an impression of epilepsy.  Patient will be prescribed Keppra on discharge.  She will have to follow-up with [facility] as outpatient.  Seizure precautions described.  Hypertensive urgency -Patient with a history of orthostatic hypotension and uses TED hose at baseline.  Patient follows up with Dr. Jacinto Halim cardiology.  Initial blood pressure was elevated but blood pressure prior to discharge was 138/64.  Mildly elevated BNP BNP on presentation was 171.  Chest x-ray and 2D echo unremarkable.  No dyspnea or chest pain.  Normocytic anemia -Patient admitted to having blood in her stool approximately 1 week ago but does have a history of diverticulosis. Hemoglobin at 10.2 at this time and is stable.  Disposition.  At this time, patient is stable for disposition home with outpatient PCP and neurology follow-up.  Spoke with the patient's daughter at bedside.  Medical Consultants:   Neurology  Procedures:    EEG Echocardiogram Continuous EEG  Subjective:   Today, patient was seen and examined at bedside.  Denies any nausea, vomiting, dizziness, lightheadedness or further episodes of seizure-like activity.  Discharge Exam:   Vitals:   04/25/21 0718 04/25/21 1107  BP: 140/64 138/64  Pulse: 82 92  Resp: 16 16  Temp: 98 F (36.7 C) 97.9 F (36.6 C)  SpO2: 100% 99%   Vitals:  04/24/21 2338 04/25/21 0355 04/25/21 0718 04/25/21 1107  BP: (!) 146/64 (!) 122/49 140/64 138/64  Pulse: 73 75 82 92  Resp: 18 17 16 16   Temp: 97.6 F (36.4 C) 98.2 F (36.8 C) 98 F (36.7 C) 97.9 F (36.6 C)  TempSrc: Oral Oral Oral Oral  SpO2: 98% 98% 100% 99%  Weight:      Height:        General: Alert awake, not in obvious distress HENT: pupils equally reacting to light,  No scleral pallor or icterus noted. Oral mucosa is moist.  Chest:  Clear breath sounds.  Diminished breath sounds bilaterally. No crackles or wheezes.  CVS: S1 &S2 heard. No murmur.  Regular rate and rhythm. Abdomen: Soft, nontender, nondistended.  Bowel sounds are heard.   Extremities: No cyanosis, clubbing or edema.  Peripheral pulses are palpable. Psych: Alert, awake and oriented, normal mood CNS:  No cranial nerve deficits.  Power equal in all extremities.   Skin: Warm and dry.  No rashes noted.  The results of significant diagnostics from this hospitalization (including imaging, microbiology, ancillary and laboratory) are listed below for reference.     Diagnostic Studies:   CT ANGIO HEAD NECK W WO CM  Result Date: 04/23/2021 CLINICAL DATA:  Stroke follow-up EXAM: CT ANGIOGRAPHY HEAD AND NECK TECHNIQUE: Multidetector CT imaging of the head and neck was performed using the standard protocol during bolus administration of intravenous contrast. Multiplanar CT image reconstructions and MIPs were obtained to evaluate the vascular anatomy. Carotid stenosis measurements (when applicable) are obtained utilizing NASCET criteria, using the distal internal carotid diameter as the denominator. CONTRAST:  75mL OMNIPAQUE IOHEXOL 350 MG/ML SOLN COMPARISON:  CT head 04/22/2021.  MRI head 04/22/2021 FINDINGS: CT HEAD FINDINGS Brain: No evidence of acute infarction, hemorrhage, hydrocephalus, extra-axial collection or mass lesion/mass effect. Mild patchy white matter hypodensity bilaterally. Vascular: Negative for hyperdense vessel Skull: Negative Sinuses: Paranasal sinuses clear. Orbits: Bilateral cataract extraction Review of the MIP images confirms the above findings CTA NECK FINDINGS Aortic arch: Atherosclerotic aortic arch. Proximal great vessels widely patent without stenosis. Bovine branching arch. Right carotid  system: Noncalcified plaque right carotid bulb with less than 25% diameter stenosis of the right internal carotid artery. Right external carotid artery widely patent. Left carotid system: Widely patent bifurcation. Negative for stenosis. Vertebral arteries: Right vertebral artery dominant. Both vertebral arteries patent to the basilar without significant stenosis. Skeleton: Cervical spondylosis.  No acute skeletal abnormality. Other neck: Negative for soft tissue mass or edema. Subcentimeter thyroid nodules bilaterally. No further imaging necessary. Upper chest: Lung apices clear bilaterally. Review of the MIP images confirms the above findings CTA HEAD FINDINGS Anterior circulation: Mild atherosclerotic calcification in the cavernous carotid bilaterally without stenosis. Anterior and middle cerebral arteries patent bilaterally without stenosis or large vessel occlusion. Posterior circulation: Both vertebral arteries patent to the basilar. Hypoplastic distal left vertebral artery. PICA patent bilaterally. Atherosclerotic disease in the basilar with mild stenosis. Anterior and middle cerebral arteries patent bilaterally without stenosis. Fetal origin right posterior cerebral artery. Venous sinuses: Normal venous enhancement Anatomic variants: None Review of the MIP images confirms the above findings IMPRESSION: 1. No significant intracranial stenosis or large vessel occlusion. 2. No significant carotid or vertebral artery stenosis in the neck. 3. CT head negative for acute abnormality. Electronically Signed   By: 04/24/2021 M.D.   On: 04/23/2021 17:52   CT Head Wo Contrast  Result Date: 04/22/2021 CLINICAL DATA:  Difficulty forming words and dizziness. EXAM: CT  HEAD WITHOUT CONTRAST TECHNIQUE: Contiguous axial images were obtained from the base of the skull through the vertex without intravenous contrast. COMPARISON:  May 30, 2002 FINDINGS: Brain: No evidence of acute infarction, hemorrhage, hydrocephalus,  extra-axial collection or mass lesion/mass effect. Mild brain parenchymal volume loss and deep white matter microangiopathy. Vascular: No hyperdense vessel or unexpected calcification. Skull: Normal. Negative for fracture or focal lesion. Sinuses/Orbits: No acute finding. Other: None. IMPRESSION: 1. No acute intracranial abnormality. 2. Mild brain parenchymal atrophy and chronic microvascular disease. Electronically Signed   By: Ted Mcalpineobrinka  Dimitrova M.D.   On: 04/22/2021 13:23   MR ANGIO HEAD WO CONTRAST  Result Date: 04/22/2021 CLINICAL DATA:  Initial evaluation for acute TIA. EXAM: MRI HEAD WITHOUT CONTRAST MRA HEAD WITHOUT CONTRAST MRA NECK WITHOUT AND WITH CONTRAST TECHNIQUE: Multiplanar, multi-echo pulse sequences of the brain and surrounding structures were acquired without intravenous contrast. Angiographic images of the Circle of Willis were acquired using MRA technique without intravenous contrast. Angiographic images of the neck were acquired using MRA technique without and with intravenous contrast. Carotid stenosis measurements (when applicable) are obtained utilizing NASCET criteria, using the distal internal carotid diameter as the denominator. CONTRAST:  8mL GADAVIST GADOBUTROL 1 MMOL/ML IV SOLN COMPARISON:  Prior CT from earlier the same day. FINDINGS: MRI HEAD FINDINGS Brain: Cerebral volume within normal limits for patient age. Scattered patchy T2/FLAIR hyperintensity within the periventricular, deep, and subcortical white matter both cerebral hemispheres, most consistent with chronic small vessel ischemic disease, mild for age. No abnormal foci of restricted diffusion to suggest acute or subacute ischemia. Gray-white matter differentiation maintained. No encephalomalacia to suggest chronic cortical infarction. No evidence for acute or chronic intracranial hemorrhage. No mass lesion, midline shift or mass effect no hydrocephalus or extra-axial fluid collection. Pituitary gland suprasellar region  normal. Midline structures intact. Vascular: Major intracranial vascular flow voids are maintained. Skull and upper cervical spine: Craniocervical junction within normal limits. Bone marrow signal intensity normal. No scalp soft tissue abnormality. Sinuses/Orbits: Patient status post bilateral ocular lens replacement. Paranasal sinuses are clear. No significant mastoid effusion. Inner ear structures grossly normal. Other: None. MRA HEAD FINDINGS ANTERIOR CIRCULATION: Visualized distal cervical segments of the internal carotid arteries are patent with antegrade flow. Petrous, cavernous, and supraclinoid segments patent without stenosis or other abnormality. A1 segments patent. Right A1 hypoplastic. Normal anterior communicating artery complex. Anterior cerebral arteries patent to their distal aspects without stenosis. Possible short-segment fenestration at the proximal left A2 segment noted. Right M1 widely patent. Normal right MCA bifurcation. Distal right MCA branches well perfused. Atheromatous irregularity with suspected mild short-segment stenosis at the proximal left M1 segment (series 12, image 6). Normal left MCA bifurcation. Distal left MCA branches well perfused. POSTERIOR CIRCULATION: Both V4 segments patent to the vertebrobasilar junction without stenosis. Right vertebral artery dominant. Both PICA patent. Basilar patent to its distal aspect without stenosis. Superior cerebellar arteries patent bilaterally. Left PCA supplied via the basilar as well as a small left posterior communicating artery. Fetal type origin of the right PCA. Both PCAs perfused to their distal aspects without stenosis. No aneurysm. MRA NECK FINDINGS AORTIC ARCH: Visualized aortic arch normal in caliber with normal branch pattern. No hemodynamically significant stenosis seen about the origin of the great vessels. RIGHT CAROTID SYSTEM: Visualized right CCA patent to the bifurcation without stenosis. Atheromatous irregularity about the  right carotid bulb/proximal right ICA with associated short-segment stenosis of up to 30% by NASCET criteria. Right ICA patent distally without stenosis, evidence for dissection or  occlusion. LEFT CAROTID SYSTEM: Left CCA patent from its origin to the bifurcation without stenosis. No significant atheromatous irregularity or narrowing about the left bifurcation. Left ICA patent distally without stenosis, evidence for dissection or occlusion. VERTEBRAL ARTERIES: Both vertebral arteries arise from subclavian arteries. Right vertebral artery dominant. No visible proximal subclavian artery stenosis on this motion degraded exam. Proximal left vertebral artery somewhat limited assessment due to adjacent venous contamination. Visualized portions of the vertebral arteries patent without stenosis, evidence for dissection or occlusion. IMPRESSION: MRI HEAD IMPRESSION: 1. No acute intracranial abnormality. 2. Mild chronic microvascular ischemic disease for age. MRA HEAD IMPRESSION: 1. Negative intracranial MRA for large vessel occlusion. 2. Mild intracranial atherosclerotic change for age with associated mild short-segment stenosis of the proximal left M1 segment. No other hemodynamically significant or correctable stenosis. MRA NECK IMPRESSION: 1. Short-segment mild stenosis of up to 30% by NASCET criteria involving the origin/proximal right ICA. 2. No hemodynamically significant stenosis identified within the left carotid artery system. 3. Patent vertebral arteries within the neck without stenosis. Right vertebral artery dominant. Electronically Signed   By: Rise Mu M.D.   On: 04/22/2021 20:10   MR ANGIO NECK W WO CONTRAST  Result Date: 04/22/2021 CLINICAL DATA:  Initial evaluation for acute TIA. EXAM: MRI HEAD WITHOUT CONTRAST MRA HEAD WITHOUT CONTRAST MRA NECK WITHOUT AND WITH CONTRAST TECHNIQUE: Multiplanar, multi-echo pulse sequences of the brain and surrounding structures were acquired without  intravenous contrast. Angiographic images of the Circle of Willis were acquired using MRA technique without intravenous contrast. Angiographic images of the neck were acquired using MRA technique without and with intravenous contrast. Carotid stenosis measurements (when applicable) are obtained utilizing NASCET criteria, using the distal internal carotid diameter as the denominator. CONTRAST:  17mL GADAVIST GADOBUTROL 1 MMOL/ML IV SOLN COMPARISON:  Prior CT from earlier the same day. FINDINGS: MRI HEAD FINDINGS Brain: Cerebral volume within normal limits for patient age. Scattered patchy T2/FLAIR hyperintensity within the periventricular, deep, and subcortical white matter both cerebral hemispheres, most consistent with chronic small vessel ischemic disease, mild for age. No abnormal foci of restricted diffusion to suggest acute or subacute ischemia. Gray-white matter differentiation maintained. No encephalomalacia to suggest chronic cortical infarction. No evidence for acute or chronic intracranial hemorrhage. No mass lesion, midline shift or mass effect no hydrocephalus or extra-axial fluid collection. Pituitary gland suprasellar region normal. Midline structures intact. Vascular: Major intracranial vascular flow voids are maintained. Skull and upper cervical spine: Craniocervical junction within normal limits. Bone marrow signal intensity normal. No scalp soft tissue abnormality. Sinuses/Orbits: Patient status post bilateral ocular lens replacement. Paranasal sinuses are clear. No significant mastoid effusion. Inner ear structures grossly normal. Other: None. MRA HEAD FINDINGS ANTERIOR CIRCULATION: Visualized distal cervical segments of the internal carotid arteries are patent with antegrade flow. Petrous, cavernous, and supraclinoid segments patent without stenosis or other abnormality. A1 segments patent. Right A1 hypoplastic. Normal anterior communicating artery complex. Anterior cerebral arteries patent to  their distal aspects without stenosis. Possible short-segment fenestration at the proximal left A2 segment noted. Right M1 widely patent. Normal right MCA bifurcation. Distal right MCA branches well perfused. Atheromatous irregularity with suspected mild short-segment stenosis at the proximal left M1 segment (series 12, image 6). Normal left MCA bifurcation. Distal left MCA branches well perfused. POSTERIOR CIRCULATION: Both V4 segments patent to the vertebrobasilar junction without stenosis. Right vertebral artery dominant. Both PICA patent. Basilar patent to its distal aspect without stenosis. Superior cerebellar arteries patent bilaterally. Left PCA supplied via the basilar  as well as a small left posterior communicating artery. Fetal type origin of the right PCA. Both PCAs perfused to their distal aspects without stenosis. No aneurysm. MRA NECK FINDINGS AORTIC ARCH: Visualized aortic arch normal in caliber with normal branch pattern. No hemodynamically significant stenosis seen about the origin of the great vessels. RIGHT CAROTID SYSTEM: Visualized right CCA patent to the bifurcation without stenosis. Atheromatous irregularity about the right carotid bulb/proximal right ICA with associated short-segment stenosis of up to 30% by NASCET criteria. Right ICA patent distally without stenosis, evidence for dissection or occlusion. LEFT CAROTID SYSTEM: Left CCA patent from its origin to the bifurcation without stenosis. No significant atheromatous irregularity or narrowing about the left bifurcation. Left ICA patent distally without stenosis, evidence for dissection or occlusion. VERTEBRAL ARTERIES: Both vertebral arteries arise from subclavian arteries. Right vertebral artery dominant. No visible proximal subclavian artery stenosis on this motion degraded exam. Proximal left vertebral artery somewhat limited assessment due to adjacent venous contamination. Visualized portions of the vertebral arteries patent without  stenosis, evidence for dissection or occlusion. IMPRESSION: MRI HEAD IMPRESSION: 1. No acute intracranial abnormality. 2. Mild chronic microvascular ischemic disease for age. MRA HEAD IMPRESSION: 1. Negative intracranial MRA for large vessel occlusion. 2. Mild intracranial atherosclerotic change for age with associated mild short-segment stenosis of the proximal left M1 segment. No other hemodynamically significant or correctable stenosis. MRA NECK IMPRESSION: 1. Short-segment mild stenosis of up to 30% by NASCET criteria involving the origin/proximal right ICA. 2. No hemodynamically significant stenosis identified within the left carotid artery system. 3. Patent vertebral arteries within the neck without stenosis. Right vertebral artery dominant. Electronically Signed   By: Rise Mu M.D.   On: 04/22/2021 20:10   MR BRAIN WO CONTRAST  Result Date: 04/22/2021 CLINICAL DATA:  Initial evaluation for acute TIA. EXAM: MRI HEAD WITHOUT CONTRAST MRA HEAD WITHOUT CONTRAST MRA NECK WITHOUT AND WITH CONTRAST TECHNIQUE: Multiplanar, multi-echo pulse sequences of the brain and surrounding structures were acquired without intravenous contrast. Angiographic images of the Circle of Willis were acquired using MRA technique without intravenous contrast. Angiographic images of the neck were acquired using MRA technique without and with intravenous contrast. Carotid stenosis measurements (when applicable) are obtained utilizing NASCET criteria, using the distal internal carotid diameter as the denominator. CONTRAST:  8mL GADAVIST GADOBUTROL 1 MMOL/ML IV SOLN COMPARISON:  Prior CT from earlier the same day. FINDINGS: MRI HEAD FINDINGS Brain: Cerebral volume within normal limits for patient age. Scattered patchy T2/FLAIR hyperintensity within the periventricular, deep, and subcortical white matter both cerebral hemispheres, most consistent with chronic small vessel ischemic disease, mild for age. No abnormal foci of  restricted diffusion to suggest acute or subacute ischemia. Gray-white matter differentiation maintained. No encephalomalacia to suggest chronic cortical infarction. No evidence for acute or chronic intracranial hemorrhage. No mass lesion, midline shift or mass effect no hydrocephalus or extra-axial fluid collection. Pituitary gland suprasellar region normal. Midline structures intact. Vascular: Major intracranial vascular flow voids are maintained. Skull and upper cervical spine: Craniocervical junction within normal limits. Bone marrow signal intensity normal. No scalp soft tissue abnormality. Sinuses/Orbits: Patient status post bilateral ocular lens replacement. Paranasal sinuses are clear. No significant mastoid effusion. Inner ear structures grossly normal. Other: None. MRA HEAD FINDINGS ANTERIOR CIRCULATION: Visualized distal cervical segments of the internal carotid arteries are patent with antegrade flow. Petrous, cavernous, and supraclinoid segments patent without stenosis or other abnormality. A1 segments patent. Right A1 hypoplastic. Normal anterior communicating artery complex. Anterior cerebral arteries patent to  their distal aspects without stenosis. Possible short-segment fenestration at the proximal left A2 segment noted. Right M1 widely patent. Normal right MCA bifurcation. Distal right MCA branches well perfused. Atheromatous irregularity with suspected mild short-segment stenosis at the proximal left M1 segment (series 12, image 6). Normal left MCA bifurcation. Distal left MCA branches well perfused. POSTERIOR CIRCULATION: Both V4 segments patent to the vertebrobasilar junction without stenosis. Right vertebral artery dominant. Both PICA patent. Basilar patent to its distal aspect without stenosis. Superior cerebellar arteries patent bilaterally. Left PCA supplied via the basilar as well as a small left posterior communicating artery. Fetal type origin of the right PCA. Both PCAs perfused to their  distal aspects without stenosis. No aneurysm. MRA NECK FINDINGS AORTIC ARCH: Visualized aortic arch normal in caliber with normal branch pattern. No hemodynamically significant stenosis seen about the origin of the great vessels. RIGHT CAROTID SYSTEM: Visualized right CCA patent to the bifurcation without stenosis. Atheromatous irregularity about the right carotid bulb/proximal right ICA with associated short-segment stenosis of up to 30% by NASCET criteria. Right ICA patent distally without stenosis, evidence for dissection or occlusion. LEFT CAROTID SYSTEM: Left CCA patent from its origin to the bifurcation without stenosis. No significant atheromatous irregularity or narrowing about the left bifurcation. Left ICA patent distally without stenosis, evidence for dissection or occlusion. VERTEBRAL ARTERIES: Both vertebral arteries arise from subclavian arteries. Right vertebral artery dominant. No visible proximal subclavian artery stenosis on this motion degraded exam. Proximal left vertebral artery somewhat limited assessment due to adjacent venous contamination. Visualized portions of the vertebral arteries patent without stenosis, evidence for dissection or occlusion. IMPRESSION: MRI HEAD IMPRESSION: 1. No acute intracranial abnormality. 2. Mild chronic microvascular ischemic disease for age. MRA HEAD IMPRESSION: 1. Negative intracranial MRA for large vessel occlusion. 2. Mild intracranial atherosclerotic change for age with associated mild short-segment stenosis of the proximal left M1 segment. No other hemodynamically significant or correctable stenosis. MRA NECK IMPRESSION: 1. Short-segment mild stenosis of up to 30% by NASCET criteria involving the origin/proximal right ICA. 2. No hemodynamically significant stenosis identified within the left carotid artery system. 3. Patent vertebral arteries within the neck without stenosis. Right vertebral artery dominant. Electronically Signed   By: Rise Mu  M.D.   On: 04/22/2021 20:10   DG CHEST PORT 1 VIEW  Result Date: 04/22/2021 CLINICAL DATA:  Shortness of breath and dizziness EXAM: PORTABLE CHEST 1 VIEW COMPARISON:  06/25/2016 FINDINGS: Cardiac shadow is mildly prominent but stable. Aortic calcifications are again seen. Calcifications are noted in the breasts bilaterally stable in appearance from prior exam and shown to be within the breasts on prior CT. No bony abnormality is seen. No focal infiltrate is noted. IMPRESSION: No active disease. Electronically Signed   By: Alcide Clever M.D.   On: 04/22/2021 17:29   EEG adult  Result Date: 04/23/2021 Charlsie Quest, MD     04/23/2021 12:03 PM Patient Name: MAKALA FETTEROLF MRN: [id number] Epilepsy Attending: Charlsie Quest Referring Physician/Provider: Dr Caryl Pina Date: 04/23/2021 Duration: 22.40 mins Patient history: 80 year old female with intermittent spells of stuttering speech. EEG to evaluate for seizure. Level of alertness: Awake, asleep AEDs during EEG study: LEV Technical aspects: This EEG study was done with scalp electrodes positioned according to the 10-20 International system of electrode placement. Electrical activity was acquired at a sampling rate of  and reviewed with a high frequency filter of  and a low frequency filter of . EEG data were recorded continuously and  digitally stored. Description: The posterior dominant rhythm consists of 8-9 Hz activity of moderate voltage (25-35 uV) seen predominantly in posterior head regions, symmetric and reactive to eye opening and eye closing. Sleep was characterized by vertex waves, sleep spindles (12 to 14 Hz), maximal frontocentral region. Spike was noted in left  anterior temporal region. Hyperventilation and photic stimulation were not performed.   ABNORMALITY -Spike, left  anterior temporal region IMPRESSION: This study is showed evidence of potential epileptogenicity arising from left  anterior temporal region. No seizures were  seen throughout the recording. Charlsie Quest   ECHOCARDIOGRAM COMPLETE  Result Date: 04/23/2021    ECHOCARDIOGRAM REPORT   Patient Name:   CASSANDA WALMER Date of Exam: 04/23/2021 Medical Rec #:  [id number]        Height:       63.0 in Accession #:    [id number]       Weight:       175.0 lb Date of Birth:  1941-01-23        BSA:          1.827 m Patient Age:    79 years         BP:           150/58 mmHg Patient Gender: F                HR:           80 bpm. Exam Location:  Inpatient Procedure: 2D Echo, Cardiac Doppler and Color Doppler Indications:    TIA  History:        Patient has no prior history of Echocardiogram examinations.                 TIA; Risk Factors:Hypertension.  Sonographer:    Shirlean Kelly Referring Phys: [id number] [facility] [place]KHAIL IMPRESSIONS  1. Left ventricular ejection fraction, by estimation, is 60 to 65%. The left ventricle has normal function. The left ventricle has no regional wall motion abnormalities. There is mild left ventricular hypertrophy. Left ventricular diastolic parameters are consistent with Grade II diastolic dysfunction (pseudonormalization). Elevated left atrial pressure.  2. Right ventricular systolic function is normal. The right ventricular size is normal. There is mildly elevated pulmonary artery systolic pressure. The estimated right ventricular systolic pressure is 35.5 mmHg.  3. The mitral valve is degenerative. Mild mitral valve regurgitation.  4. The aortic valve is tricuspid. There is moderate calcification of the aortic valve. Aortic valve regurgitation is not visualized. Mild to moderate aortic valve stenosis. Vmax 2.3 m/s, MG 12 mmHg, AVA 1.1 cm^2, DI 0.4.  5. The inferior vena cava is normal in size with greater than 50% respiratory variability, suggesting right atrial pressure of 3 mmHg. FINDINGS  Left Ventricle: Left ventricular ejection fraction, by estimation, is 60 to 65%. The left ventricle has normal function. The left ventricle has no regional  wall motion abnormalities. The left ventricular internal cavity size was normal in size. There is  mild left ventricular hypertrophy. Left ventricular diastolic parameters are consistent with Grade II diastolic dysfunction (pseudonormalization). Elevated left atrial pressure. Right Ventricle: The right ventricular size is normal. No increase in right ventricular wall thickness. Right ventricular systolic function is normal. There is mildly elevated pulmonary artery systolic pressure. The tricuspid regurgitant velocity is 2.85  m/s, and with an assumed right atrial pressure of 3 mmHg, the estimated right ventricular systolic pressure is 35.5 mmHg. Left [facility]: Left atrial size was normal in size. Right [facility]: Right  atrial size was normal in size. Pericardium: There is no evidence of pericardial effusion. Mitral Valve: The mitral valve is degenerative in appearance. Mild mitral valve regurgitation. Tricuspid Valve: The tricuspid valve is normal in structure. Tricuspid valve regurgitation is trivial. Aortic Valve: The aortic valve is tricuspid. There is moderate calcification of the aortic valve. Aortic valve regurgitation is not visualized. Mild to moderate aortic stenosis is present. Aortic valve mean gradient measures 10.0 mmHg. Aortic valve peak gradient measures 17.3 mmHg. Aortic valve area, by VTI measures 1.40 cm. Pulmonic Valve: The pulmonic valve was not well visualized. Pulmonic valve regurgitation is mild. Aorta: The aortic root and ascending aorta are structurally normal, with no evidence of dilitation. Venous: The inferior vena cava is normal in size with greater than 50% respiratory variability, suggesting right atrial pressure of 3 mmHg. IAS/Shunts: The interatrial septum was not well visualized.  LEFT VENTRICLE PLAX 2D LVIDd:         4.30 cm  Diastology LVIDs:         2.90 cm  LV e' medial:    5.11 cm/s LV PW:         1.00 cm  LV E/e' medial:  29.5 LV IVS:        0.90 cm  LV e' lateral:   7.72 cm/s  LVOT diam:     1.90 cm  LV E/e' lateral: 19.6 LV SV:         60 LV SV Index:   33 LVOT Area:     2.84 cm  RIGHT VENTRICLE             IVC RV S prime:     12.70 cm/s  IVC diam: 1.20 cm TAPSE (M-mode): 2.9 cm LEFT ATRIUM             Index       RIGHT ATRIUM           Index LA diam:        3.10 cm 1.70 cm/m  RA Area:     10.40 cm LA Vol (A2C):   53.0 ml 29.01 ml/m RA Volume:   18.40 ml  10.07 ml/m LA Vol (A4C):   52.0 ml 28.49 ml/m LA Biplane Vol: 52.6 ml 28.79 ml/m  AORTIC VALVE AV Area (Vmax):    1.39 cm AV Area (Vmean):   1.36 cm AV Area (VTI):     1.40 cm AV Vmax:           208.00 cm/s AV Vmean:          144.000 cm/s AV VTI:            0.429 m AV Peak Grad:      17.3 mmHg AV Mean Grad:      10.0 mmHg LVOT Vmax:         102.00 cm/s LVOT Vmean:        68.900 cm/s LVOT VTI:          0.212 m LVOT/AV VTI ratio: 0.49  AORTA Ao Root diam: 2.70 cm MITRAL VALVE                TRICUSPID VALVE MV Area (PHT): 4.60 cm     TR Peak grad:   32.5 mmHg MV Decel Time: 165 msec     TR Vmax:        285.00 cm/s MV E velocity: 151.00 cm/s MV A velocity: 168.00 cm/s  SHUNTS MV E/A ratio:  0.90  Systemic VTI:  0.21 m                             Systemic Diam: 1.90 cm Epifanio Lesches MD Electronically signed by Epifanio Lesches MD Signature Date/Time: 04/23/2021/12:12:29 PM    Final      Labs:   Basic Metabolic Panel: Recent Labs  Lab 04/22/21 1238 04/24/21 0315  NA 137 136  K 3.6 3.5  CL 107 99  CO2 23 28  GLUCOSE 88 111*  BUN <5* 5*  CREATININE 1.13* 0.97  CALCIUM 9.1 9.2   GFR Estimated Creatinine Clearance: 47.7 mL/min (by C-G formula based on SCr of 0.97 mg/dL). Liver Function Tests: No results for input(s): AST, ALT, ALKPHOS, BILITOT, PROT, ALBUMIN in the last 168 hours. No results for input(s): LIPASE, AMYLASE in the last 168 hours. No results for input(s): AMMONIA in the last 168 hours. Coagulation profile No results for input(s): INR, PROTIME in the last 168  hours.  CBC: Recent Labs  Lab 04/22/21 1238 04/24/21 0315  WBC 3.5* 3.9*  HGB 11.0* 10.2*  HCT 35.1* 30.8*  MCV 96.4 90.6  PLT 224 221   Cardiac Enzymes: Recent Labs  Lab 04/23/21 0250  CKTOTAL 73   BNP: Invalid input(s): POCBNP CBG: Recent Labs  Lab 04/22/21 1216  GLUCAP 91   D-Dimer No results for input(s): DDIMER in the last 72 hours. Hgb A1c Recent Labs    04/23/21 0250  HGBA1C 5.7*   Lipid Profile Recent Labs    04/23/21 0250  CHOL 136  HDL 50  LDLCALC 76  TRIG 51  CHOLHDL 2.7   Thyroid function studies No results for input(s): TSH, T4TOTAL, T3FREE, THYROIDAB in the last 72 hours.  Invalid input(s): FREET3 Anemia work up No results for input(s): VITAMINB12, FOLATE, FERRITIN, TIBC, IRON, RETICCTPCT in the last 72 hours. Microbiology Recent Results (from the past 240 hour(s))  Resp Panel by RT-PCR (Flu A&B, Covid) Nasopharyngeal Swab     Status: None   Collection Time: 04/22/21  3:58 PM   Specimen: Nasopharyngeal Swab; Nasopharyngeal(NP) swabs in vial transport medium  Result Value Ref Range Status   SARS Coronavirus 2 by RT PCR NEGATIVE NEGATIVE Final    Comment: (NOTE) SARS-CoV-2 target nucleic acids are NOT DETECTED.  The SARS-CoV-2 RNA is generally detectable in upper respiratory specimens during the acute phase of infection. The lowest concentration of SARS-CoV-2 viral copies this assay can detect is 138 copies/mL. A negative result does not preclude SARS-Cov-2 infection and should not be used as the sole basis for treatment or other patient management decisions. A negative result may occur with  improper specimen collection/handling, submission of specimen other than nasopharyngeal swab, presence of viral mutation(s) within the areas targeted by this assay, and inadequate number of viral copies(<138 copies/mL). A negative result must be combined with clinical observations, patient history, and epidemiological information. The expected  result is Negative.  Fact Sheet for Patients:  BloggerCourse.com  Fact Sheet for Healthcare Providers:  SeriousBroker.it  This test is no t yet approved or cleared by the [place] FDA and  has been authorized for detection and/or diagnosis of SARS-CoV-2 by FDA under an Emergency Use Authorization (EUA). This EUA will remain  in effect (meaning this test can be used) for the duration of the COVID-19 declaration under Section 564(b)(1) of the Act, 21 U.S.C.section 360bbb-3(b)(1), unless the authorization is terminated  or revoked sooner.  Influenza A by PCR NEGATIVE NEGATIVE Final   Influenza B by PCR NEGATIVE NEGATIVE Final    Comment: (NOTE) The Xpert Xpress SARS-CoV-2/FLU/RSV plus assay is intended as an aid in the diagnosis of influenza from Nasopharyngeal swab specimens and should not be used as a sole basis for treatment. Nasal washings and aspirates are unacceptable for Xpert Xpress SARS-CoV-2/FLU/RSV testing.  Fact Sheet for Patients: BloggerCourse.com  Fact Sheet for Healthcare Providers: SeriousBroker.it  This test is not yet approved or cleared by the [place] FDA and has been authorized for detection and/or diagnosis of SARS-CoV-2 by FDA under an Emergency Use Authorization (EUA). This EUA will remain in effect (meaning this test can be used) for the duration of the COVID-19 declaration under Section 564(b)(1) of the Act, 21 U.S.C. section 360bbb-3(b)(1), unless the authorization is terminated or revoked.  Performed at [facility], [address] [place], [place] [address]      Discharge Instructions:   Discharge Instructions    Diet general   Complete by: As directed    Discharge instructions   Complete by: As directed    Follow-up with your primary care physician in 1 week.  Follow-up with [facility]  in 3 months.  Please do not drive.  Take medications as prescribed.   Increase activity slowly   Complete by: As directed      Allergies as of 04/25/2021      Reactions   Penicillins    Has patient had a PCN reaction causing immediate rash, facial/tongue/throat swelling, SOB or lightheadedness with hypotension: NO Has patient had a PCN reaction causing severe rash involving mucus membranes or skin necrosis: NO Has patient had a PCN reaction that required hospitalizationNO Has patient had a PCN reaction occurring within the last 10 years: NO If all of the above answers are "NO", then may proceed with Cephalosporin use.   [facility] Allergy Skin Test    Cat Hair Extract    Gramineae Pollens    Latex Itching   Shellfish Allergy      Allergies as of 04/25/2021      Reactions   Penicillins    Has patient had a PCN reaction causing immediate rash, facial/tongue/throat swelling, SOB or lightheadedness with hypotension: NO Has patient had a PCN reaction causing severe rash involving mucus membranes or skin necrosis: NO Has patient had a PCN reaction that required hospitalizationNO Has patient had a PCN reaction occurring within the last 10 years: NO If all of the above answers are "NO", then may proceed with Cephalosporin use.   [employer] Allergy Skin Test    Cat Hair Extract    Gramineae Pollens    Latex Itching   Shellfish Allergy       Medication List    STOP taking these medications   ibuprofen 800 MG tablet Commonly known as: ADVIL   predniSONE 10 MG tablet Commonly known as: DELTASONE     TAKE these medications   albuterol 108 (90 Base) MCG/ACT inhaler Commonly known as: VENTOLIN HFA Inhale 2 puffs into the lungs every 6 (six) hours as needed for wheezing or shortness of breath.   alendronate 10 MG tablet Commonly known as: FOSAMAX Take 10 mg by mouth once a week.   aspirin EC 81 MG tablet Take 1 tablet (81 mg total) by mouth daily. Swallow whole.    atorvastatin 10 MG tablet Commonly known as: LIPITOR Take 2 tablets (20 mg total) by mouth daily. Start taking on:  April 26, 2021   levETIRAcetam 500 MG tablet Commonly known as: Keppra Take 1 tablet (500 mg total) by mouth 2 (two) times daily.   loratadine 10 MG tablet Commonly known as: CLARITIN Take 10 mg by mouth daily.   Restasis 0.05 % ophthalmic emulsion Generic drug: cycloSPORINE Place 1 drop into both eyes 2 (two) times daily.   Vitamin D (Ergocalciferol) 1.25 MG (50000 UNIT) Caps capsule Commonly known as: DRISDOL Take 1 capsule by mouth once a week.         Time coordinating discharge: 39 minutes  Signed:  Chenay Nesmith  [facility] 04/25/2021, 11:29 AM

## 2021-04-25 NOTE — Procedures (Addendum)
Patient Name: Jennifer Calhoun  MRN: [id number]  Epilepsy Attending: Charlsie Quest  Referring Physician/Provider: Dr Lindie Spruce Duration: 04/24/2021 1142 to 04/25/2021 1007  Patient history: 80 year old female with intermittent spells of stuttering speech.EEG to evaluate for seizure.   Level of alertness: Awake, asleep  AEDs during EEG study: LEV  Technical aspects: This EEG study was done with scalp electrodes positioned according to the 10-20 International system of electrode placement. Electrical activity was acquired at a sampling rate of 500Hz  and reviewed with a high frequency filter of 70Hz  and a low frequency filter of 1Hz . EEG data were recorded continuously and digitally stored.   Description: The posterior dominant rhythm consists of 8-9 Hz activity of moderate voltage (25-35 uV) seen predominantly in posterior head regions, symmetric and reactive to eye opening and eye closing. Sleep was characterized by vertex waves, sleep spindles (12 to 14 Hz), maximal frontocentral region. Spikes were noted in left  anterior temporal region. Hyperventilation and photic stimulation were not performed.     ABNORMALITY -Spike, left anterior temporal region  IMPRESSION: This study is showed evidence of potential epileptogenicity arising from left  anterior temporal region. No seizures were seen throughout the recording.  Kentarius Partington 

## 2021-04-25 NOTE — TOC Transition Note (Signed)
Transition of Care [facility]) - CM/SW Discharge Note   Patient Details  Name: Jennifer Calhoun MRN: [id number] Date of Birth: 27-Nov-1940  [facility]) CM/SW Contact:  Baldemar Lenis, LCSW Phone Number: 04/25/2021, 12:07 PM   Clinical Narrative:   Patient discharging home today, home health set up with [facility]. CSW confirmed they will reach out to patient to schedule. No other needs at this time. CSW signing off.    Final next level of care: Home w Home Health Services Barriers to Discharge: Barriers Resolved   Patient Goals and CMS Choice   CMS Medicare.gov Compare Post Acute Care list provided to:: Patient Choice offered to / list presented to : Patient  Discharge Placement                       Discharge Plan and Services                          HH Arranged: PT [facility] Agency: [facility] Health Date [facility] Agency Contacted: 04/25/21   Representative spoke with at [facility] Agency: Marylene Land  Social Determinants of Health (SDOH) Interventions     Readmission Risk Interventions No flowsheet data found.

## 2021-04-25 NOTE — Progress Notes (Signed)
Discontinued cEEG.  Removed EEG electrodes.  Notified [facility] monitoring.  No skin breakdown observed.

## 2021-04-25 NOTE — Progress Notes (Signed)
Subjective: No clinical seizures overnight.  ROS: negative except above  Examination  Vital signs in last 24 hours: Temp:  [97.6 F (36.4 C)-98.4 F (36.9 C)] 97.9 F (36.6 C) (06/02 1107) Pulse Rate:  [73-92] 92 (06/02 1107) Resp:  [16-18] 16 (06/02 1107) BP: (122-150)/(49-71) 138/64 (06/02 1107) SpO2:  [97 %-100 %] 99 % (06/02 1107)  General: lying in bed, not in apparent distress CVS: pulse-normal rate and rhythm RS: breathing comfortably, CTA B Extremities: normal, warm Neuro: AOx3, cranial nerves II through XII grossly intact, 5/5 in all 4 extremities, FTN intact   Basic Metabolic Panel: Recent Labs  Lab 04/22/21 1238 04/24/21 0315  NA 137 136  K 3.6 3.5  CL 107 99  CO2 23 28  GLUCOSE 88 111*  BUN <5* 5*  CREATININE 1.13* 0.97  CALCIUM 9.1 9.2    CBC: Recent Labs  Lab 04/22/21 1238 04/24/21 0315  WBC 3.5* 3.9*  HGB 11.0* 10.2*  HCT 35.1* 30.8*  MCV 96.4 90.6  PLT 224 221     Coagulation Studies: No results for input(s): LABPROT, INR in the last 72 hours.  Imaging No new brain imaging overnight  ASSESSMENT AND PLAN: 80 year old female who presented on 04/22/2021 with intermittent difficulty forming words, dizziness/lightheadedness and dyspnea on exertion.  Epilepsy -EEG showed spikes in left anterior temporal region concerning for epilepsy  Recommendation -Discussed diagnosis of epilepsy with patient. -Recommend starting Keppra 500 mg twice daily. Discussed side effects of Keppra  -Continue seizure precaution  including do not drive for 6 months/until cleared by physician -Follow-up with neurology in 3 months  Seizure precautions: Per [facility] statutes, patients with seizures are not allowed to drive until they have been seizure-free for six months and cleared by a physician    Use caution when using heavy equipment or power tools. Avoid working on ladders or at heights. Take showers instead of baths. Ensure the water temperature  is not too high on the home water heater. Do not go swimming alone. Do not lock yourself in a room alone (i.e. bathroom). When caring for infants or small children, sit down when holding, feeding, or changing them to minimize risk of injury to the child in the event you have a seizure. Maintain good sleep hygiene. Avoid alcohol.    If patient has another seizure, call 911 and bring them back to the ED if: A.  The seizure lasts longer than 5 minutes.      B.  The patient doesn't wake shortly after the seizure or has new problems such as difficulty seeing, speaking or moving following the seizure C.  The patient was injured during the seizure D.  The patient has a temperature over 102 F (39C) E.  The patient vomited during the seizure and now is having trouble breathing    During the Seizure   - First, ensure adequate ventilation and place patients on the floor on their left side  Loosen clothing around the neck and ensure the airway is patent. If the patient is clenching the teeth, do not force the mouth open with any object as this can cause severe damage - Remove all items from the surrounding that can be hazardous. The patient may be oblivious to what's happening and may not even know what he or she is doing. If the patient is confused and wandering, either gently guide him/her away and block access to outside areas - Reassure the individual and be comforting - Call 911. In most cases,  the seizure ends before EMS arrives. However, there are cases when seizures may last over 3 to 5 minutes. Or the individual may have developed breathing difficulties or severe injuries. If a pregnant patient or a person with diabetes develops a seizure, it is prudent to call an ambulance. - Finally, if the patient does not regain full consciousness, then call EMS. Most patients will remain confused for about 45 to 90 minutes after a seizure, so you must use judgment in calling for help. - Avoid restraints but make  sure the patient is in a bed with padded side rails - Place the individual in a lateral position with the neck slightly flexed; this will help the saliva drain from the mouth and prevent the tongue from falling backward - Remove all nearby furniture and other hazards from the area - Provide verbal assurance as the individual is regaining consciousness - Provide the patient with privacy if possible - Call for help and start treatment as ordered by the caregiver    After the Seizure (Postictal Stage)   After a seizure, most patients experience confusion, fatigue, muscle pain and/or a headache. Thus, one should permit the individual to sleep. For the next few days, reassurance is essential. Being calm and helping reorient the person is also of importance.   Most seizures are painless and end spontaneously. Seizures are not harmful to others but can lead to complications such as stress on the lungs, brain and the heart. Individuals with prior lung problems may develop labored breathing and respiratory distress.    I have spent a total of25  minuteswith the patient reviewing hospitalnotes,  test results, labs and examining the patient as well as establishing an assessment and plan that was discussed personally with the patient.>50% of time was spent in direct patient care.  Lindie Spruce Epilepsy [facility] For questions after 5pm please refer to AMION to reach the Neurologist on call

## 2021-04-27 DIAGNOSIS — I951 Orthostatic hypotension: Secondary | ICD-10-CM | POA: Diagnosis not present

## 2021-04-27 DIAGNOSIS — E785 Hyperlipidemia, unspecified: Secondary | ICD-10-CM | POA: Diagnosis not present

## 2021-04-27 DIAGNOSIS — D500 Iron deficiency anemia secondary to blood loss (chronic): Secondary | ICD-10-CM | POA: Diagnosis not present

## 2021-04-27 DIAGNOSIS — G459 Transient cerebral ischemic attack, unspecified: Secondary | ICD-10-CM | POA: Diagnosis not present

## 2021-04-27 DIAGNOSIS — G40909 Epilepsy, unspecified, not intractable, without status epilepticus: Secondary | ICD-10-CM | POA: Diagnosis not present

## 2021-04-27 DIAGNOSIS — I160 Hypertensive urgency: Secondary | ICD-10-CM | POA: Diagnosis not present

## 2021-05-01 DIAGNOSIS — D500 Iron deficiency anemia secondary to blood loss (chronic): Secondary | ICD-10-CM | POA: Diagnosis not present

## 2021-05-01 DIAGNOSIS — E785 Hyperlipidemia, unspecified: Secondary | ICD-10-CM | POA: Diagnosis not present

## 2021-05-01 DIAGNOSIS — I951 Orthostatic hypotension: Secondary | ICD-10-CM | POA: Diagnosis not present

## 2021-05-01 DIAGNOSIS — I160 Hypertensive urgency: Secondary | ICD-10-CM | POA: Diagnosis not present

## 2021-05-01 DIAGNOSIS — G459 Transient cerebral ischemic attack, unspecified: Secondary | ICD-10-CM | POA: Diagnosis not present

## 2021-05-01 DIAGNOSIS — G40909 Epilepsy, unspecified, not intractable, without status epilepticus: Secondary | ICD-10-CM | POA: Diagnosis not present

## 2021-05-02 DIAGNOSIS — D649 Anemia, unspecified: Secondary | ICD-10-CM | POA: Diagnosis not present

## 2021-05-02 DIAGNOSIS — E871 Hypo-osmolality and hyponatremia: Secondary | ICD-10-CM | POA: Diagnosis not present

## 2021-05-02 DIAGNOSIS — R42 Dizziness and giddiness: Secondary | ICD-10-CM | POA: Diagnosis not present

## 2021-05-07 DIAGNOSIS — I160 Hypertensive urgency: Secondary | ICD-10-CM | POA: Diagnosis not present

## 2021-05-07 DIAGNOSIS — E785 Hyperlipidemia, unspecified: Secondary | ICD-10-CM | POA: Diagnosis not present

## 2021-05-07 DIAGNOSIS — D500 Iron deficiency anemia secondary to blood loss (chronic): Secondary | ICD-10-CM | POA: Diagnosis not present

## 2021-05-07 DIAGNOSIS — G40909 Epilepsy, unspecified, not intractable, without status epilepticus: Secondary | ICD-10-CM | POA: Diagnosis not present

## 2021-05-07 DIAGNOSIS — G459 Transient cerebral ischemic attack, unspecified: Secondary | ICD-10-CM | POA: Diagnosis not present

## 2021-05-07 DIAGNOSIS — I951 Orthostatic hypotension: Secondary | ICD-10-CM | POA: Diagnosis not present

## 2021-05-10 DIAGNOSIS — G459 Transient cerebral ischemic attack, unspecified: Secondary | ICD-10-CM | POA: Diagnosis not present

## 2021-05-10 DIAGNOSIS — E785 Hyperlipidemia, unspecified: Secondary | ICD-10-CM | POA: Diagnosis not present

## 2021-05-10 DIAGNOSIS — D500 Iron deficiency anemia secondary to blood loss (chronic): Secondary | ICD-10-CM | POA: Diagnosis not present

## 2021-05-10 DIAGNOSIS — I160 Hypertensive urgency: Secondary | ICD-10-CM | POA: Diagnosis not present

## 2021-05-10 DIAGNOSIS — I951 Orthostatic hypotension: Secondary | ICD-10-CM | POA: Diagnosis not present

## 2021-05-10 DIAGNOSIS — G40909 Epilepsy, unspecified, not intractable, without status epilepticus: Secondary | ICD-10-CM | POA: Diagnosis not present

## 2021-05-14 DIAGNOSIS — I951 Orthostatic hypotension: Secondary | ICD-10-CM | POA: Diagnosis not present

## 2021-05-14 DIAGNOSIS — E785 Hyperlipidemia, unspecified: Secondary | ICD-10-CM | POA: Diagnosis not present

## 2021-05-14 DIAGNOSIS — G40909 Epilepsy, unspecified, not intractable, without status epilepticus: Secondary | ICD-10-CM | POA: Diagnosis not present

## 2021-05-14 DIAGNOSIS — D500 Iron deficiency anemia secondary to blood loss (chronic): Secondary | ICD-10-CM | POA: Diagnosis not present

## 2021-05-14 DIAGNOSIS — I160 Hypertensive urgency: Secondary | ICD-10-CM | POA: Diagnosis not present

## 2021-05-14 DIAGNOSIS — G459 Transient cerebral ischemic attack, unspecified: Secondary | ICD-10-CM | POA: Diagnosis not present

## 2021-05-16 DIAGNOSIS — E785 Hyperlipidemia, unspecified: Secondary | ICD-10-CM | POA: Diagnosis not present

## 2021-05-16 DIAGNOSIS — G459 Transient cerebral ischemic attack, unspecified: Secondary | ICD-10-CM | POA: Diagnosis not present

## 2021-05-16 DIAGNOSIS — G40909 Epilepsy, unspecified, not intractable, without status epilepticus: Secondary | ICD-10-CM | POA: Diagnosis not present

## 2021-05-16 DIAGNOSIS — I951 Orthostatic hypotension: Secondary | ICD-10-CM | POA: Diagnosis not present

## 2021-05-16 DIAGNOSIS — D500 Iron deficiency anemia secondary to blood loss (chronic): Secondary | ICD-10-CM | POA: Diagnosis not present

## 2021-05-16 DIAGNOSIS — I160 Hypertensive urgency: Secondary | ICD-10-CM | POA: Diagnosis not present

## 2021-05-21 DIAGNOSIS — D500 Iron deficiency anemia secondary to blood loss (chronic): Secondary | ICD-10-CM | POA: Diagnosis not present

## 2021-05-21 DIAGNOSIS — G40909 Epilepsy, unspecified, not intractable, without status epilepticus: Secondary | ICD-10-CM | POA: Diagnosis not present

## 2021-05-21 DIAGNOSIS — E785 Hyperlipidemia, unspecified: Secondary | ICD-10-CM | POA: Diagnosis not present

## 2021-05-21 DIAGNOSIS — I160 Hypertensive urgency: Secondary | ICD-10-CM | POA: Diagnosis not present

## 2021-05-21 DIAGNOSIS — G459 Transient cerebral ischemic attack, unspecified: Secondary | ICD-10-CM | POA: Diagnosis not present

## 2021-05-21 DIAGNOSIS — I951 Orthostatic hypotension: Secondary | ICD-10-CM | POA: Diagnosis not present

## 2021-05-23 DIAGNOSIS — K5730 Diverticulosis of large intestine without perforation or abscess without bleeding: Secondary | ICD-10-CM | POA: Diagnosis not present

## 2021-05-23 DIAGNOSIS — Z1211 Encounter for screening for malignant neoplasm of colon: Secondary | ICD-10-CM | POA: Diagnosis not present

## 2021-05-23 DIAGNOSIS — Z86010 Personal history of colonic polyps: Secondary | ICD-10-CM | POA: Diagnosis not present

## 2021-05-24 DIAGNOSIS — I160 Hypertensive urgency: Secondary | ICD-10-CM | POA: Diagnosis not present

## 2021-05-24 DIAGNOSIS — I951 Orthostatic hypotension: Secondary | ICD-10-CM | POA: Diagnosis not present

## 2021-05-24 DIAGNOSIS — E785 Hyperlipidemia, unspecified: Secondary | ICD-10-CM | POA: Diagnosis not present

## 2021-05-24 DIAGNOSIS — G40909 Epilepsy, unspecified, not intractable, without status epilepticus: Secondary | ICD-10-CM | POA: Diagnosis not present

## 2021-05-24 DIAGNOSIS — D500 Iron deficiency anemia secondary to blood loss (chronic): Secondary | ICD-10-CM | POA: Diagnosis not present

## 2021-05-24 DIAGNOSIS — G459 Transient cerebral ischemic attack, unspecified: Secondary | ICD-10-CM | POA: Diagnosis not present

## 2021-05-30 DIAGNOSIS — E785 Hyperlipidemia, unspecified: Secondary | ICD-10-CM | POA: Diagnosis not present

## 2021-05-30 DIAGNOSIS — I160 Hypertensive urgency: Secondary | ICD-10-CM | POA: Diagnosis not present

## 2021-05-30 DIAGNOSIS — G459 Transient cerebral ischemic attack, unspecified: Secondary | ICD-10-CM | POA: Diagnosis not present

## 2021-05-30 DIAGNOSIS — I951 Orthostatic hypotension: Secondary | ICD-10-CM | POA: Diagnosis not present

## 2021-05-30 DIAGNOSIS — G40909 Epilepsy, unspecified, not intractable, without status epilepticus: Secondary | ICD-10-CM | POA: Diagnosis not present

## 2021-05-30 DIAGNOSIS — D500 Iron deficiency anemia secondary to blood loss (chronic): Secondary | ICD-10-CM | POA: Diagnosis not present

## 2021-05-31 DIAGNOSIS — E785 Hyperlipidemia, unspecified: Secondary | ICD-10-CM | POA: Diagnosis not present

## 2021-05-31 DIAGNOSIS — D500 Iron deficiency anemia secondary to blood loss (chronic): Secondary | ICD-10-CM | POA: Diagnosis not present

## 2021-05-31 DIAGNOSIS — I951 Orthostatic hypotension: Secondary | ICD-10-CM | POA: Diagnosis not present

## 2021-05-31 DIAGNOSIS — I160 Hypertensive urgency: Secondary | ICD-10-CM | POA: Diagnosis not present

## 2021-05-31 DIAGNOSIS — G40909 Epilepsy, unspecified, not intractable, without status epilepticus: Secondary | ICD-10-CM | POA: Diagnosis not present

## 2021-05-31 DIAGNOSIS — G459 Transient cerebral ischemic attack, unspecified: Secondary | ICD-10-CM | POA: Diagnosis not present

## 2021-06-04 DIAGNOSIS — G40909 Epilepsy, unspecified, not intractable, without status epilepticus: Secondary | ICD-10-CM | POA: Diagnosis not present

## 2021-06-04 DIAGNOSIS — E785 Hyperlipidemia, unspecified: Secondary | ICD-10-CM | POA: Diagnosis not present

## 2021-06-04 DIAGNOSIS — I160 Hypertensive urgency: Secondary | ICD-10-CM | POA: Diagnosis not present

## 2021-06-04 DIAGNOSIS — D500 Iron deficiency anemia secondary to blood loss (chronic): Secondary | ICD-10-CM | POA: Diagnosis not present

## 2021-06-04 DIAGNOSIS — I951 Orthostatic hypotension: Secondary | ICD-10-CM | POA: Diagnosis not present

## 2021-06-04 DIAGNOSIS — G459 Transient cerebral ischemic attack, unspecified: Secondary | ICD-10-CM | POA: Diagnosis not present

## 2021-06-19 ENCOUNTER — Encounter: Payer: Self-pay | Admitting: Neurology

## 2021-06-19 ENCOUNTER — Ambulatory Visit: Payer: Medicare HMO | Admitting: Neurology

## 2021-06-19 VITALS — BP 130/66 | HR 87 | Ht 63.0 in | Wt 184.0 lb

## 2021-06-19 DIAGNOSIS — R9401 Abnormal electroencephalogram [EEG]: Secondary | ICD-10-CM | POA: Diagnosis not present

## 2021-06-19 MED ORDER — LEVETIRACETAM 500 MG PO TABS: 500.0000 mg | ORAL_TABLET | Freq: Two times a day (BID) | ORAL | 2 refills | Status: DC

## 2021-06-19 NOTE — Patient Instructions (Addendum)
Continue Keppra 500 mg twice a day  Will obtain Keppra level today We will obtain an 24 hrs ambulatory EEG today; and if normal we will discontinue the Keppra  Do not drive until after completion of EEG Return in 3 months or sooner if worse

## 2021-06-19 NOTE — Progress Notes (Signed)
[facility]  PATIENT: Jennifer Calhoun DOB: September 30, 1941  REFERRING CLINICIAN: Marva Panda, NP HISTORY FROM: Patient  REASON FOR VISIT: Abnormal EEG, possible seizure   HISTORICAL  CHIEF COMPLAINT:  Chief Complaint  Patient presents with   New Patient (Initial Visit)    Rm 12, with NP/Paper/Kimberly Millsaps [facility] [phone]/Seizure disorder/abnormal EEG Would like to discuss EEG and driving privileges.     HISTORY OF PRESENT ILLNESS:  80 year old women with past medical history of of small bowel obstruction hyperlipidemia seasonal allergy and TIA who is presenting for an abnormal EEG.  Patient reported that in the month of May she was feeling lightheaded in the setting of drinking lots of water, to help with her constipation and and presented to Urgent care.  Initial labs done showed that she had hyponatremia but he preferred to go home and follow up with her primary care doctor.  Her symptoms of of lightheadedness did not resolved, therefore she presented to the ED.  In the ED she was noted to be hyponatremic therefore she was admitted for further work-up.  From chart review it was documented that she was having some speech difficulty and word finding difficulty and also noted to have abnormal movements.  Patient denies any history of abnormal movements. She mentioned that what she is feels anxious he appeared to be stuttering.  She also reported during her admission her daughter who knows her well was in the hospital and reported to providers that her speech was normal  During her work-up for for hyponatremia she did have a routine EEG and a overnight EEG which showed a left temporal spikes, and because of the abnormal EEG and after further discussion with the patient, plan was to start her on Keppra 500 twice daily and to follow-up with outpatient neurology  Patient reports that since leaving the hospital she has been doing fine.  Her  lightheadedness resolved, she reports that she had additional lab work and her sodium normalized.  She denies any previous history of seizures denies any family history of seizures denies any seizure risk factors.   She had a brain MRI which showed a cerebral volume with within normal limits for patient's age there was some scattered patchy T2 flair hyperintense hyperintensity within the periventricular deep and subcortical white matter most consistent consistent with chronic small vessel ischemic disease no evidence of seizures and no evidence of encephalomalacia   She has a history of rectal bleed, history of diverticulitis, and she is scheduled for colonoscopy  REVIEW OF SYSTEMS: Full 14 system review of systems performed and negative with exception of: as noted in the HPI  ALLERGIES: Allergies  Allergen Reactions   Penicillins     Has patient had a PCN reaction causing immediate rash, facial/tongue/throat swelling, SOB or lightheadedness with hypotension: NO Has patient had a PCN reaction causing severe rash involving mucus membranes or skin necrosis: NO Has patient had a PCN reaction that required hospitalizationNO Has patient had a PCN reaction occurring within the last 10 years: NO If all of the above answers are "NO", then may proceed with Cephalosporin use.   [employer] Allergy Skin Test    Cat Hair Extract    Gramineae Pollens    Latex Itching   Shellfish Allergy     HOME MEDICATIONS: Outpatient Medications Prior to Visit  Medication Sig Dispense Refill   albuterol (VENTOLIN HFA) 108 (90 Base) MCG/ACT inhaler Inhale 2 puffs into the lungs every 6 (six) hours  as needed for wheezing or shortness of breath.     alendronate (FOSAMAX) 10 MG tablet Take 10 mg by mouth once a week.     aspirin EC 81 MG tablet Take 1 tablet (81 mg total) by mouth daily. Swallow whole. 90 tablet 3   atorvastatin (LIPITOR) 10 MG tablet Take 2 tablets (20 mg total) by mouth daily. 90 tablet 2    loratadine (CLARITIN) 10 MG tablet Take 10 mg by mouth daily.     RESTASIS 0.05 % ophthalmic emulsion Place 1 drop into both eyes 2 (two) times daily.     Vitamin D, Ergocalciferol, (DRISDOL) 1.25 MG (50000 UNIT) CAPS capsule Take 1 capsule by mouth once a week.     levETIRAcetam (KEPPRA) 500 MG tablet Take 1 tablet (500 mg total) by mouth 2 (two) times daily. 60 tablet 2   No facility-administered medications prior to visit.    PAST MEDICAL HISTORY: Past Medical History:  Diagnosis Date   History of small bowel obstruction    x3   History of TIA (transient ischemic attack) 2003   Hyperlipemia    Seasonal allergies    Wears glasses     PAST SURGICAL HISTORY: Past Surgical History:  Procedure Laterality Date   CATARACT EXTRACTION     both eyes   CHOLECYSTECTOMY N/A 03/22/2015   Procedure: LAPAROSCOPIC CHOLECYSTECTOMY ;  Surgeon: Abigail Miyamoto, MD;  Location: [facility];  Service: General;  Laterality: N/A;   COLONOSCOPY     FEMORAL HERNIA REPAIR  2000   small bowel resection-abstruction   TOTAL ABDOMINAL HYSTERECTOMY      FAMILY HISTORY: History reviewed. No pertinent family history.  SOCIAL HISTORY: Social History   Socioeconomic History   Marital status: Single    Spouse name: Not on file   Number of children: Not on file   Years of education: Not on file   Highest education level: Not on file  Occupational History   Not on file  Tobacco Use   Smoking status: Former    Types: Cigarettes    Quit date: 03/20/1972    Years since quitting: 49.2   Smokeless tobacco: Never  Vaping Use   Vaping Use: Never used  Substance and Sexual Activity   Alcohol use: Yes    Comment: glass wine nightly   Drug use: No   Sexual activity: Not on file  Other Topics Concern   Not on file  Social History Narrative   Not on file   Social Determinants of Health   Financial Resource Strain: Not on file  Food Insecurity: Not on file  Transportation Needs: Not on  file  Physical Activity: Not on file  Stress: Not on file  Social Connections: Not on file  Intimate Partner Violence: Not on file     PHYSICAL EXAM  GENERAL EXAM/CONSTITUTIONAL: Vitals:  Vitals:   06/19/21 1301  BP: 130/66  Pulse: 87  Weight: 184 lb (83.5 kg)  Height: 5\' 3"  (1.6 m)   Body mass index is 32.59 kg/m. Wt Readings from Last 3 Encounters:  06/19/21 184 lb (83.5 kg)  04/22/21 175 lb (79.4 kg)  04/07/21 175 lb (79.4 kg)   Patient is in no distress; well developed, nourished and groomed; neck is supple  CARDIOVASCULAR: Examination of carotid arteries is normal; no carotid bruits Regular rate and rhythm, no murmurs Examination of peripheral vascular system by observation and palpation is normal  EYES: Pupils round and reactive to light, Visual fields full to confrontation,  Extraocular movements intacts,   MUSCULOSKELETAL: Gait, strength, tone, movements noted in Neurologic exam below  NEUROLOGIC: MENTAL STATUS:  No flowsheet data found. awake, alert, oriented to person, place and time recent and remote memory intact normal attention and concentration language fluent, comprehension intact, naming intact fund of knowledge appropriate  CRANIAL NERVE:  2nd, 3rd, 4th, 6th - pupils equal and reactive to light, visual fields full to confrontation, extraocular muscles intact, no nystagmus 5th - facial sensation symmetric 7th - facial strength symmetric 8th - hearing intact 9th - palate elevates symmetrically, uvula midline 11th - shoulder shrug symmetric 12th - tongue protrusion midline  MOTOR:  normal bulk and tone, full strength in the BUE, BLE  SENSORY:  normal and symmetric to light touch, pinprick, temperature, vibration  COORDINATION:  finger-nose-finger, fine finger movements normal  REFLEXES:  deep tendon reflexes present and symmetric  GAIT/STATION:  normal    DIAGNOSTIC DATA (LABS, IMAGING, TESTING) - I reviewed patient records,  labs, notes, testing and imaging myself where available.  MRI brain reviewed, results noted in the HPI  Lab Results  Component Value Date   WBC 3.9 (L) 04/24/2021   HGB 10.2 (L) 04/24/2021   HCT 30.8 (L) 04/24/2021   MCV 90.6 04/24/2021   PLT 221 04/24/2021      Component Value Date/Time   NA 136 04/24/2021 0315   K 3.5 04/24/2021 0315   CL 99 04/24/2021 0315   CO2 28 04/24/2021 0315   GLUCOSE 111 (H) 04/24/2021 0315   BUN 5 (L) 04/24/2021 0315   CREATININE 0.97 04/24/2021 0315   CALCIUM 9.2 04/24/2021 0315   PROT 6.6 04/07/2021 1313   ALBUMIN 3.5 04/07/2021 1313   AST 19 04/07/2021 1313   ALT 17 04/07/2021 1313   ALKPHOS 69 04/07/2021 1313   BILITOT 0.5 04/07/2021 1313   GFRNONAA 60 (L) 04/24/2021 0315   GFRAA >60 06/25/2016 1143   Lab Results  Component Value Date   CHOL 136 04/23/2021   HDL 50 04/23/2021   LDLCALC 76 04/23/2021   TRIG 51 04/23/2021   CHOLHDL 2.7 04/23/2021   Lab Results  Component Value Date   HGBA1C 5.7 (H) 04/23/2021   No results found for: VITAMINB12   MRI HEAD IMPRESSION:   1. No acute intracranial abnormality. 2. Mild chronic microvascular ischemic disease for age.   MRA HEAD IMPRESSION:   1. Negative intracranial MRA for large vessel occlusion. 2. Mild intracranial atherosclerotic change for age with associated mild short-segment stenosis of the proximal left M1 segment. No other hemodynamically significant or correctable stenosis.     ASSESSMENT AND PLAN  80 y.o. year old female here past medical history of small bowel obstruction, seasonal allergies, hyponatremia who is presenting after  an abnormal EEG in the work-up for hyponatremia.  Patient denies any previous history of seizures, denies any history of abnormal movements,  she denies any seizure risk factor and any family history of seizure.  Currently she is on 500 mg of Keppra twice daily and denies any side effect from the medication  She also denies that during her  recent admission in the hospital that she did not have any abnormal movement or abnormal behavior and denies also having word finding difficulty and she reported that she was very anxious and was stuttering.  She is concerned about her diagnosis of epilepsy and also concerned about the medication.  I explained to her that she was started on medication likely for the the abnormal EEG and initial presentation.  I also recommended that we obtain a 24-hour ambulatory EEG and if the EEG is normal we can take her off medication and she can resume driving.  We will get a Keppra level for baseline today.  Patient voices understanding.     1. Abnormal EEG       PLAN: Continue Keppra 500 mg twice a day  Will obtain Keppra level today We will obtain an 24 hrs ambulatory EEG today (No video needed); and if normal we will discontinue the Keppra  Do not drive until after completion of EEG Return in 3 months or sooner if worse   Orders Placed This Encounter  Procedures   Levetiracetam level   EEG adult    Meds ordered this encounter  Medications   levETIRAcetam (KEPPRA) 500 MG tablet    Sig: Take 1 tablet (500 mg total) by mouth 2 (two) times daily.    Dispense:  60 tablet    Refill:  2    Return in about 3 months (around 09/19/2021).    Windell Norfolk, MD 06/19/2021, 2:56 PM  [facility] [address], [address] [place], [place] [address] [phone]

## 2021-06-20 ENCOUNTER — Telehealth: Payer: Self-pay | Admitting: Neurology

## 2021-06-20 NOTE — Telephone Encounter (Signed)
24 hr EEG order & OV notes faxed to [facility]. They will reach out to patient to schedule, patient informed.

## 2021-06-22 LAB — LEVETIRACETAM LEVEL: Levetiracetam Lvl: 11.6 ug/mL (ref 10.0–40.0)

## 2021-06-24 ENCOUNTER — Telehealth: Payer: Self-pay | Admitting: Neurology

## 2021-06-24 ENCOUNTER — Ambulatory Visit: Payer: Medicare HMO | Admitting: Neurology

## 2021-06-24 NOTE — Telephone Encounter (Signed)
Pt states there is an issue with her taking the levETIRAcetam (KEPPRA) 500 MG tablet and she'd like a call back  to discuss.

## 2021-06-24 NOTE — Telephone Encounter (Signed)
I called the patient to provide the recommendations from Dr. Epimenio Foot (0.5 tablet in morning and 1 tablet in evening). She is agreeable to this plan. She was also informed of her lab results. She is waiting for her 24-hr EEG to be scheduled. Once completed, she is aware we will contact her to discuss the findings.

## 2021-06-24 NOTE — Telephone Encounter (Signed)
Plan from visit on 06/19/21:   PLAN: Continue Keppra 500 mg twice a day Will obtain Keppra level today We will obtain an 24 hrs ambulatory EEG today (No video needed); and if normal we will discontinue the Keppra  Do not drive until after completion of EEG Return in 3 months or sooner if worse ____________________________________  I reviewed the plan again with the patient and she is in agreement. ____________________________________  She wanted to report that she has been taking extended naps during the day (hours at a time). She felt like this may be related to taking Keppra. She has just noticed this to be more of any issue over the last few weeks (started Keppra 500mg , one tab BID on 04/22/21). Recently, she has been getting out of the house more so this may be a contributing factor to her fatigue. However, she does not feel tired but tends to easily fall asleep shortly after sitting in her recliner. I inquired about her routine sleep pattern. She goes to bed between midnight-2am and gets up between 8-9am. She also urinates during that time. She wanted to try reducing the dose of Keppra to once daily. I instructed her to continue taking it twice daily and explained the rationale behind the dosing schedule. I also encouraged her to make sure she is getting enough rest overnight and keep the same sleep pattern. She would also benefit from limiting the length of her naps during the day. Try not to drink too much prior to bed to hopefully reduce urinary frequency.

## 2021-06-27 DIAGNOSIS — H04123 Dry eye syndrome of bilateral lacrimal glands: Secondary | ICD-10-CM | POA: Diagnosis not present

## 2021-06-27 DIAGNOSIS — H16223 Keratoconjunctivitis sicca, not specified as Sjogren's, bilateral: Secondary | ICD-10-CM | POA: Diagnosis not present

## 2021-07-01 NOTE — Telephone Encounter (Signed)
I have reached out to [employer], rep for [employer], to see if Dr. Teresa Coombs is set up to place an order for a 24 hr EEG In-Home. Awaiting response.

## 2021-07-01 NOTE — Telephone Encounter (Signed)
Thank you :)

## 2021-07-01 NOTE — Telephone Encounter (Signed)
Pt called, have not heard from anyone to schedule my 24 hr EEG. Would like a call from the nurse.

## 2021-07-01 NOTE — Telephone Encounter (Signed)
Returned call to patient.  Patient received a call from somewhere that told her they received her order for a 24 hour but were unable to a 24 hour EEG.    Message sent to Gerilyn Pilgrim, will follow up with [facility] for ordering placement.    Message sent to Dr. Teresa Coombs for review.

## 2021-07-02 NOTE — Telephone Encounter (Signed)
I faxed order to [facility].

## 2021-07-03 NOTE — Telephone Encounter (Signed)
Patient is scheduled for her In-Home Video EEG from (9/12 - 08/06/21), per her request.

## 2021-07-17 ENCOUNTER — Telehealth: Payer: Self-pay | Admitting: Neurology

## 2021-07-17 NOTE — Telephone Encounter (Signed)
I spoke to the patient. She has been taking her levetiracetam 500mg , one tab BID inconsistently. She has not been taking it at the same time daily and now only taking one tab daily. I educated her on the rationale and importance of taking it about the same time, twice daily. We discussed the potential consequences of missed doses. She has been having vivid dreams but does not wish to give up on the medication just yet. She would like to try taking it a little longer, as prescribed. She will call back if she continues to have issues. Her 72-hr EEG is scheduled for 08/12/21.

## 2021-07-17 NOTE — Telephone Encounter (Signed)
Pt called stating she is having some side affects to the levETIRAcetam (KEPPRA) 500 MG tablet. She has went to taking one pill a day instead of 2 and she is wanting to know if that is okay. Pt is requesting a call back.

## 2021-08-12 DIAGNOSIS — R569 Unspecified convulsions: Secondary | ICD-10-CM | POA: Diagnosis not present

## 2021-08-12 DIAGNOSIS — R9401 Abnormal electroencephalogram [EEG]: Secondary | ICD-10-CM | POA: Diagnosis not present

## 2021-08-19 ENCOUNTER — Telehealth: Payer: Self-pay | Admitting: Neurology

## 2021-08-19 NOTE — Telephone Encounter (Signed)
Pt called asking about the results of her 24hr EEG. Pt requesting a call back.

## 2021-08-21 ENCOUNTER — Encounter: Payer: Self-pay | Admitting: Neurology

## 2021-08-21 NOTE — Progress Notes (Signed)
24-hour EEG completed and it was abnormal due to presence of left anterior temporal sharps consistent with a left anterior temporal region of epileptiform potential.  Windell Norfolk, MD

## 2021-09-01 ENCOUNTER — Encounter: Payer: Self-pay | Admitting: Neurology

## 2021-09-01 NOTE — Addendum Note (Signed)
Addended byWindell Norfolk on: 09/01/2021 04:48 PM   Modules accepted: Orders

## 2021-09-01 NOTE — Procedures (Signed)
    Description 72 Hours Ambulatory Video EEG  TECHNICAL DESCRIPTION: This AVEEG was performed using equipment provided by [employer] utilizing Bluetooth ( Trackit ) amplifiers with continuous EEGT attended video collection using encrypted remote transmission via [employer] secured cellular tower network with data rates for each AVEEG performed. This is a Therapist, music AVEEG, obtained, according to the 10-20 international electrode placement system, reformatted digitally into referential and bipolar montages. Data was acquired with a minimum of 21 bipolar connections and sampled at a minimum rate of 250 cycles per second per channel, maximum rate of 450 cycles per second per channel and two channels for EKG. The entire AVEEG study was recorded through cable and or radio telemetry for subsequent analysis. Specified epochs of the AVEEG data were identified at the direction of the subject by the depression of a push button by the patient. Each patient's event file included data acquired two minutes prior to the push button activation and continuing until two minutes afterwards. AVEEG files were reviewed on [employer] Platform by [employer] with a digital high frequency filter set at 70 Hz and a low frequency filter set at 0.1 Hz with a paper speed of 90mm/s resulting in 10 seconds per digital page. This entire AVEEG was reviewed by the EEG Technologist. Random time samples, random sleep samples, clips, patient initiated push button files with included patient daily diary logs, EEG Technologist bookmarked note data was reviewed and verified for accuracy and validity by the governing reading neurologist in full details. This AEEGV was fully compliant with all requirements for CPT 97500 for setup, patient education, take down and administered by an EEG technologist.   INTERMITTENT MONITORING WITH VIDEO TECHNICAL DESCRIPTION:  This Long-Term AVEEG was monitored intermittently by a qualified EEG technologist for  the entirety of the recording; quality check-ins were performed at a minimum of every two hours, checking and documenting real-time data and video to assure the integrity and quality of the recording (e.g., camera position, electrode integrity and impedance), and identify the need for maintenance. For intermittent monitoring, an EEG Technologist monitored no more than 12 patients concurrently. Diagnostic video was captured at least 80% of the time during the recording.   TECHNOLOGIST EVENTS: Notes for potential intermittent left anterior-temporal sharp waves were detected by the reviewing neurodiagnostic technologist during the recording for further evaluation.  PATIENT EVENTS:  The patient pushed the event button 0 times during the AVEEG recording for further evaluation. However, EEGT test press #2 times with pt at the beginning of the tracing.  TIME SAMPLES: 1 minute of every hour recorded are reviewed as random time samples.   SLEEP SAMPLES: 5 minutes of every 24 hour recorded sleep cycle are reviewed as random sleep samples.   BACKGROUND EEG: This AVEEG consists of well-modulated bilateral synchronous and symmetrical background in the alpha frequencies in the awake state.  AWAKE: The posterior dominant rhythm was characterized by symmetric and reactive 9 Hz activity with eyes closed.   SLEEP: Stage two sleep displayed Sleep Spindles and Vertex Waveform components. All other sleep stages appeared symmetrical and synchronous with regards to K-Complexes, and REM.  EKG: Normal sinus rhythm.    AVEEG Interpretations: This is an abnormal AVEEG due to the presence of left anterior-temporal sharps consistent with left anterior-temporal region of epileptogenic potential.       Windell Norfolk, MD [facility]

## 2021-09-16 ENCOUNTER — Telehealth: Payer: Self-pay | Admitting: Neurology

## 2021-09-16 NOTE — Telephone Encounter (Signed)
Pt called, having a reaction to levETIRAcetam (KEPPRA) 500 MG tablet. It knocks me out, fatigue, having dreams. Would like a call from the nurse discuss maybe taking a half tablet or prescribe something else.

## 2021-09-16 NOTE — Telephone Encounter (Signed)
Spoke with patient, ask her to cut the Keppra tablet to half, she stated that she started taking half a tab  since Saturday and actually she felt much better.  I will see her as scheduled on October 27 and if no additional side effects I will send her with a new prescription of Keppra 250 mg twice daily.   Windell Norfolk, MD

## 2021-09-19 ENCOUNTER — Other Ambulatory Visit: Payer: Self-pay

## 2021-09-19 ENCOUNTER — Ambulatory Visit: Payer: Medicare HMO | Admitting: Neurology

## 2021-09-19 ENCOUNTER — Encounter: Payer: Self-pay | Admitting: Neurology

## 2021-09-19 VITALS — BP 160/67 | HR 75 | Ht 63.0 in | Wt 183.0 lb

## 2021-09-19 DIAGNOSIS — G40909 Epilepsy, unspecified, not intractable, without status epilepticus: Secondary | ICD-10-CM | POA: Diagnosis not present

## 2021-09-19 MED ORDER — LEVETIRACETAM 250 MG PO TABS: 250.0000 mg | ORAL_TABLET | Freq: Two times a day (BID) | ORAL | 4 refills | Status: DC

## 2021-09-19 NOTE — Patient Instructions (Signed)
Continue with Keppra 250 mg twice a day  Return in one year.

## 2021-09-19 NOTE — Progress Notes (Signed)
[facility]  PATIENT: Jennifer Calhoun DOB: 03-29-1941  REFERRING CLINICIAN: Everardo Beals, NP HISTORY FROM: Patient  REASON FOR VISIT: Abnormal EEG, possible seizure   HISTORICAL  CHIEF COMPLAINT:  Chief Complaint  Patient presents with   Follow-up    RM 12, alone. Feeling ok. Lower dose of keppra better tolerated.      INTERVAL HISTORY 09/19/2021: Patient present today for follow-up, since last clinic visit, no abnormal movements noted, no speech difficulty, no other concerns.  However she did have side effect of the Keppra described as vivid dreams and feeling tired all the time.  She decreased the dosage to 250 twice daily and to report improvement of her symptoms.  Her 24-hour ambulatory tori EEG was also abnormal showing left temporal sharp.  I have explained to her that due to the abnormal EEG it is reasonable to continue her on antiseizure medication for at least 1 year or 2 given no seizure or no other concerns.  HISTORY OF PRESENT ILLNESS:  80 year old women with past medical history of of small bowel obstruction hyperlipidemia seasonal allergy and TIA who is presenting for an abnormal EEG.  Patient reported that in the month of May she was feeling lightheaded in the setting of drinking lots of water, to help with her constipation and and presented to Urgent care.  Initial labs done showed that she had hyponatremia but he preferred to go home and follow up with her primary care doctor.  Her symptoms of of lightheadedness did not resolved, therefore she presented to the ED.  In the ED she was noted to be hyponatremic therefore she was admitted for further work-up.  From chart review it was documented that she was having some speech difficulty and word finding difficulty and also noted to have abnormal movements.  Patient denies any history of abnormal movements. She mentioned that what she is feels anxious he appeared to be stuttering.  She also reported  during her admission her daughter who knows her well was in the hospital and reported to providers that her speech was normal  During her work-up for for hyponatremia she did have a routine EEG and a overnight EEG which showed a left temporal spikes, and because of the abnormal EEG and after further discussion with the patient, plan was to start her on Keppra 500 twice daily and to follow-up with outpatient neurology  Patient reports that since leaving the hospital she has been doing fine.  Her lightheadedness resolved, she reports that she had additional lab work and her sodium normalized.  She denies any previous history of seizures denies any family history of seizures denies any seizure risk factors.   She had a brain MRI which showed a cerebral volume with within normal limits for patient's age there was some scattered patchy T2 flair hyperintense hyperintensity within the periventricular deep and subcortical white matter most consistent consistent with chronic small vessel ischemic disease no evidence of seizures and no evidence of encephalomalacia   She has a history of rectal bleed, history of diverticulitis, and she is scheduled for colonoscopy  REVIEW OF SYSTEMS: Full 14 system review of systems performed and negative with exception of: as noted in the HPI  ALLERGIES: Allergies  Allergen Reactions   Penicillins     Has patient had a PCN reaction causing immediate rash, facial/tongue/throat swelling, SOB or lightheadedness with hypotension: NO Has patient had a PCN reaction causing severe rash involving mucus membranes or skin necrosis: NO Has patient  had a PCN reaction that required hospitalizationNO Has patient had a PCN reaction occurring within the last 10 years: NO If all of the above answers are "NO", then may proceed with Cephalosporin use.   [employer] Allergy Skin Test    Cat Hair Extract    Gramineae Pollens    Latex Itching   Shellfish Allergy     HOME  MEDICATIONS: Outpatient Medications Prior to Visit  Medication Sig Dispense Refill   albuterol (VENTOLIN HFA) 108 (90 Base) MCG/ACT inhaler Inhale 2 puffs into the lungs every 6 (six) hours as needed for wheezing or shortness of breath.     alendronate (FOSAMAX) 10 MG tablet Take 10 mg by mouth once a week.     aspirin EC 81 MG tablet Take 1 tablet (81 mg total) by mouth daily. Swallow whole. 90 tablet 3   atorvastatin (LIPITOR) 10 MG tablet Take 2 tablets (20 mg total) by mouth daily. 90 tablet 2   loratadine (CLARITIN) 10 MG tablet Take 10 mg by mouth daily.     RESTASIS 0.05 % ophthalmic emulsion Place 1 drop into both eyes 2 (two) times daily.     Vitamin D, Ergocalciferol, (DRISDOL) 1.25 MG (50000 UNIT) CAPS capsule Take 1 capsule by mouth once a week.     levETIRAcetam (KEPPRA) 500 MG tablet Take 1 tablet (500 mg total) by mouth 2 (two) times daily. 60 tablet 2   No facility-administered medications prior to visit.    PAST MEDICAL HISTORY: Past Medical History:  Diagnosis Date   History of small bowel obstruction    x3   History of TIA (transient ischemic attack) 2003   Hyperlipemia    Seasonal allergies    Wears glasses     PAST SURGICAL HISTORY: Past Surgical History:  Procedure Laterality Date   CATARACT EXTRACTION     both eyes   CHOLECYSTECTOMY N/A 03/22/2015   Procedure: LAPAROSCOPIC CHOLECYSTECTOMY ;  Surgeon: Coralie Keens, MD;  Location: [facility];  Service: General;  Laterality: N/A;   COLONOSCOPY     FEMORAL HERNIA REPAIR  2000   small bowel resection-abstruction   TOTAL ABDOMINAL HYSTERECTOMY      FAMILY HISTORY: History reviewed. No pertinent family history.  SOCIAL HISTORY: Social History   Socioeconomic History   Marital status: Single    Spouse name: Not on file   Number of children: Not on file   Years of education: Not on file   Highest education level: Not on file  Occupational History   Not on file  Tobacco Use    Smoking status: Former    Types: Cigarettes    Quit date: 03/20/1972    Years since quitting: 49.5   Smokeless tobacco: Never  Vaping Use   Vaping Use: Never used  Substance and Sexual Activity   Alcohol use: Yes    Comment: glass wine nightly   Drug use: No   Sexual activity: Not on file  Other Topics Concern   Not on file  Social History Narrative   Not on file   Social Determinants of Health   Financial Resource Strain: Not on file  Food Insecurity: Not on file  Transportation Needs: Not on file  Physical Activity: Not on file  Stress: Not on file  Social Connections: Not on file  Intimate Partner Violence: Not on file     PHYSICAL EXAM  GENERAL EXAM/CONSTITUTIONAL: Vitals:  Vitals:   09/19/21 1258  BP: (!) 160/67  Pulse: 75  Weight:  183 lb (83 kg)  Height: 5' 3" (1.6 m)    Body mass index is 32.42 kg/m. Wt Readings from Last 3 Encounters:  09/19/21 183 lb (83 kg)  06/19/21 184 lb (83.5 kg)  04/22/21 175 lb (79.4 kg)   Patient is in no distress; well developed, nourished and groomed; neck is supple  EYES: Pupils round and reactive to light, Visual fields full to confrontation, Extraocular movements intacts,   MUSCULOSKELETAL: Gait, strength, tone, movements noted in Neurologic exam below  NEUROLOGIC: MENTAL STATUS:  No flowsheet data found. awake, alert, oriented to person, place and time recent and remote memory intact normal attention and concentration language fluent, comprehension intact, naming intact fund of knowledge appropriate  CRANIAL NERVE:  2nd, 3rd, 4th, 6th - pupils equal and reactive to light, visual fields full to confrontation, extraocular muscles intact, no nystagmus 5th - facial sensation symmetric 7th - facial strength symmetric 8th - hearing intact 9th - palate elevates symmetrically, uvula midline 11th - shoulder shrug symmetric 12th - tongue protrusion midline  MOTOR:  normal bulk and tone, full strength in the BUE,  BLE  SENSORY:  normal and symmetric to light touch, pinprick, temperature, vibration  COORDINATION:  finger-nose-finger, fine finger movements normal  REFLEXES:  deep tendon reflexes present and symmetric  GAIT/STATION:  normal   DIAGNOSTIC DATA (LABS, IMAGING, TESTING) - I reviewed patient records, labs, notes, testing and imaging myself where available.  MRI brain reviewed, results noted in the HPI  Lab Results  Component Value Date   WBC 3.9 (L) 04/24/2021   HGB 10.2 (L) 04/24/2021   HCT 30.8 (L) 04/24/2021   MCV 90.6 04/24/2021   PLT 221 04/24/2021      Component Value Date/Time   NA 136 04/24/2021 0315   K 3.5 04/24/2021 0315   CL 99 04/24/2021 0315   CO2 28 04/24/2021 0315   GLUCOSE 111 (H) 04/24/2021 0315   BUN 5 (L) 04/24/2021 0315   CREATININE 0.97 04/24/2021 0315   CALCIUM 9.2 04/24/2021 0315   PROT 6.6 04/07/2021 1313   ALBUMIN 3.5 04/07/2021 1313   AST 19 04/07/2021 1313   ALT 17 04/07/2021 1313   ALKPHOS 69 04/07/2021 1313   BILITOT 0.5 04/07/2021 1313   GFRNONAA 60 (L) 04/24/2021 0315   GFRAA >60 06/25/2016 1143   Lab Results  Component Value Date   CHOL 136 04/23/2021   HDL 50 04/23/2021   LDLCALC 76 04/23/2021   TRIG 51 04/23/2021   CHOLHDL 2.7 04/23/2021   Lab Results  Component Value Date   HGBA1C 5.7 (H) 04/23/2021   No results found for: VITAMINB12   MRI HEAD IMPRESSION: 1. No acute intracranial abnormality. 2. Mild chronic microvascular ischemic disease for age.    MRA HEAD IMPRESSION: 1. Negative intracranial MRA for large vessel occlusion. 2. Mild intracranial atherosclerotic change for age with associated mild short-segment stenosis of the proximal left M1 segment. No other hemodynamically significant or correctable stenosis.    24 amb EEG:  This is an abnormal AVEEG due to the presence of left anterior-temporal sharps consistent with left anterior-temporal region of epileptogenic potential.      ASSESSMENT AND  PLAN  80 y.o. year old female here past medical history of small bowel obstruction, seasonal allergies, hyponatremia here for follow-up for abnormal EEG and seizure.  We have repeated a ambulatory EEG which show left temporal sharps.  She is continued on Keppra 500 mg twice daily but due to side effect of vivid dreams and  daytime tiredness we decrease the Keppra to 250 twice daily and she reports improvement of her symptoms.  I will continue her on Keppra 250 twice daily but inform her if she does have additional side effect then we will switch her to a different antiseizure medication.  I will see her in 1 year for follow-up.     1. Seizure disorder ([place])      PLAN: Continue with Keppra 250 mg twice a day  Return in one year. At that time, we will repeat amb EEG and if normal, will consider coming off ASM.    No orders of the defined types were placed in this encounter.   Meds ordered this encounter  Medications   levETIRAcetam (KEPPRA) 250 MG tablet    Sig: Take 1 tablet (250 mg total) by mouth 2 (two) times daily.    Dispense:  180 tablet    Refill:  4     Return in about 1 year (around 09/19/2022).    Alric Ran, MD 09/19/2021, 1:33 PM  [facility] [address] [place], [place] [address] [phone]

## 2021-11-20 DIAGNOSIS — H04123 Dry eye syndrome of bilateral lacrimal glands: Secondary | ICD-10-CM | POA: Diagnosis not present

## 2021-11-20 DIAGNOSIS — H40023 Open angle with borderline findings, high risk, bilateral: Secondary | ICD-10-CM | POA: Diagnosis not present

## 2021-11-20 DIAGNOSIS — H31091 Other chorioretinal scars, right eye: Secondary | ICD-10-CM | POA: Diagnosis not present

## 2021-11-20 DIAGNOSIS — H353131 Nonexudative age-related macular degeneration, bilateral, early dry stage: Secondary | ICD-10-CM | POA: Diagnosis not present

## 2021-11-20 DIAGNOSIS — Z961 Presence of intraocular lens: Secondary | ICD-10-CM | POA: Diagnosis not present

## 2021-11-20 DIAGNOSIS — H524 Presbyopia: Secondary | ICD-10-CM | POA: Diagnosis not present

## 2022-01-29 ENCOUNTER — Telehealth: Payer: Self-pay | Admitting: Neurology

## 2022-01-29 NOTE — Telephone Encounter (Signed)
Spoke with patient, states that lately she has been falling asleep when it gets dark making her miss her evening dose. Denies any seizures. Advised her to try taking 2 tabs in the morning.  Also advised her to watch for side effect of drowsiness.

## 2022-01-29 NOTE — Telephone Encounter (Signed)
Last seen 09/19/21: ? ?Keppra 500 mg twice daily but due to side effect of vivid dreams and daytime tiredness.  ? ?At this same appt, levetiracetam was reduced to 250mg , one tab BID. ?___________________________________ ?I called the patient. The vivid dreams have resolved.  ? ?She gets up between 8-9am, does not feel sleepy all day.  When she sits down in the evenings around 6-7pm, she falls to sleep so easily and is out for hours, sometimes waking up in the middle of night and wondering how so much time had passed. Typically, she is able to go back to sleep. This is the symptom she is concerned about - sleeping so many hours during the course of a 24-hour period. States she is normally a night owl and this is unusual for her. It has been an issue for a while and she feels it may be related to her medication. She requested we discuss with Dr. . No seizures reported.  ? ?She is also due for her annual physical with her PCP plans to discuss it with her as well.   ?

## 2022-01-29 NOTE — Telephone Encounter (Signed)
Pt states that KEPPRA knocks her out, she'd like a call to discuss another medication. ?

## 2022-04-23 DIAGNOSIS — Z0000 Encounter for general adult medical examination without abnormal findings: Secondary | ICD-10-CM | POA: Diagnosis not present

## 2022-04-29 DIAGNOSIS — Z79899 Other long term (current) drug therapy: Secondary | ICD-10-CM | POA: Diagnosis not present

## 2022-04-29 DIAGNOSIS — E559 Vitamin D deficiency, unspecified: Secondary | ICD-10-CM | POA: Diagnosis not present

## 2022-05-15 ENCOUNTER — Telehealth: Payer: Self-pay | Admitting: Neurology

## 2022-05-15 NOTE — Telephone Encounter (Signed)
I spoke to the patient. She is aware of Dr. Karie Georges response. She will follow up with her PCP.

## 2022-05-15 NOTE — Telephone Encounter (Signed)
Pt is asking for a call to discuss If her sodium is low as a result of medication that Dr Teresa Coombs has her on, please call.

## 2022-05-15 NOTE — Telephone Encounter (Signed)
I spoke to the patient. During a recent physical, she was informed her sodium level was 133. She is concerned this due to her seizure medication. Reports a history of low sodium in the past. She would like her medication reviewed.

## 2022-05-15 NOTE — Telephone Encounter (Signed)
Please inform patient that her low sodium is not due to Keppra.   Thanks

## 2022-07-21 ENCOUNTER — Telehealth: Payer: Self-pay | Admitting: Neurology

## 2022-07-21 NOTE — Telephone Encounter (Signed)
Pt said only have a one dosage left of  levETIRAcetam (KEPPRA) 250 MG tablet (Expired). Ask Dr. Teresa Coombs do  I need to come back from [place] to take my medication. Would like a call from the nurse

## 2022-07-21 NOTE — Telephone Encounter (Signed)
I called the pt. She sts she is on vacation in [place] and forgot to pack enough meds for the remainder of her trip. She is scheduled to leave Thursday at midnight.   She wanted to know if it would be ok for her to miss her Keppra on Wed and Thursday. I advised safely we could not advise on this b/c missing dosages of med could lead to break through seizures.   She reports she thought that would be the answer and will try and find a local pharmacy to fill this meds.

## 2022-08-04 DIAGNOSIS — H04123 Dry eye syndrome of bilateral lacrimal glands: Secondary | ICD-10-CM | POA: Diagnosis not present

## 2022-08-06 DIAGNOSIS — L0231 Cutaneous abscess of buttock: Secondary | ICD-10-CM | POA: Diagnosis not present

## 2022-09-22 ENCOUNTER — Encounter: Payer: Self-pay | Admitting: Neurology

## 2022-09-22 ENCOUNTER — Ambulatory Visit: Payer: Medicare HMO | Admitting: Neurology

## 2022-09-22 VITALS — BP 148/88 | Ht 63.0 in | Wt 184.0 lb

## 2022-09-22 DIAGNOSIS — G40909 Epilepsy, unspecified, not intractable, without status epilepticus: Secondary | ICD-10-CM | POA: Diagnosis not present

## 2022-09-22 DIAGNOSIS — R799 Abnormal finding of blood chemistry, unspecified: Secondary | ICD-10-CM | POA: Diagnosis not present

## 2022-09-22 MED ORDER — LEVETIRACETAM ER 500 MG PO TB24: 500.0000 mg | ORAL_TABLET | Freq: Every day | ORAL | 2 refills | Status: DC

## 2022-09-22 NOTE — Patient Instructions (Addendum)
Discontinue Keppra 250 mg twice daily Start with Keppra extended release 500 mg nightly 48 hrs ambulatory EEG We will check a TSH and B12 level since there is complaint of fatigue and patient being a vegetarian Continue your other medications Follow-up in 1 year or sooner if worse

## 2022-09-22 NOTE — Progress Notes (Signed)
[facility]  PATIENT: Jennifer Calhoun DOB: [birth year]  REFERRING CLINICIAN: Everardo Beals, NP HISTORY FROM: Patient  REASON FOR VISIT: Abnormal EEG, possible seizure   HISTORICAL  CHIEF COMPLAINT:  Chief Complaint  Patient presents with   Follow-up    Rm 14. Alone, reports no seizures, here to discuss keppra    INTERVAL HISTORY 09/22/22:  Patient presents today for follow-up, last visit was in October 2022.  Since last visit she has not had any seizure or seizure-like activity.  She is on Keppra to 50 mg twice daily but still complains of daytime somnolence, vivid dream.  She really does not want to stay on the [place]. She does report fatigue and low energy, she reports that she is a vegetarian and she is not taking any B12 supplement    INTERVAL HISTORY 09/19/2021: Patient present today for follow-up, since last clinic visit, no abnormal movements noted, no speech difficulty, no other concerns.  However she did have side effect of the Keppra described as vivid dreams and feeling tired all the time.  She decreased the dosage to 250 twice daily and to report improvement of her symptoms.  Her 24-hour ambulatory tori EEG was also abnormal showing left temporal sharp.  I have explained to her that due to the abnormal EEG it is reasonable to continue her on antiseizure medication for at least 1 year or 2 given no seizure or no other concerns  HISTORY OF PRESENT ILLNESS:  81 year old women with past medical history of of small bowel obstruction hyperlipidemia seasonal allergy and TIA who is presenting for an abnormal EEG.  Patient reported that in the month of May she was feeling lightheaded in the setting of drinking lots of water, to help with her constipation and and presented to Urgent care.  Initial labs done showed that she had hyponatremia but he preferred to go home and follow up with her primary care doctor.  Her symptoms of of lightheadedness did not  resolved, therefore she presented to the ED.  In the ED she was noted to be hyponatremic therefore she was admitted for further work-up.  From chart review it was documented that she was having some speech difficulty and word finding difficulty and also noted to have abnormal movements.  Patient denies any history of abnormal movements. She mentioned that what she is feels anxious he appeared to be stuttering.  She also reported during her admission her daughter who knows her well was in the hospital and reported to providers that her speech was normal  During her work-up for for hyponatremia she did have a routine EEG and a overnight EEG which showed a left temporal spikes, and because of the abnormal EEG and after further discussion with the patient, plan was to start her on Keppra 500 twice daily and to follow-up with outpatient neurology  Patient reports that since leaving the hospital she has been doing fine.  Her lightheadedness resolved, she reports that she had additional lab work and her sodium normalized.  She denies any previous history of seizures denies any family history of seizures denies any seizure risk factors.   She had a brain MRI which showed a cerebral volume with within normal limits for patient's age there was some scattered patchy T2 flair hyperintense hyperintensity within the periventricular deep and subcortical white matter most consistent consistent with chronic small vessel ischemic disease no evidence of seizures and no evidence of encephalomalacia   She has a history of rectal  bleed, history of diverticulitis, and she is scheduled for colonoscopy  REVIEW OF SYSTEMS: Full 14 system review of systems performed and negative with exception of: as noted in the HPI  ALLERGIES: Allergies  Allergen Reactions   Penicillins     Has patient had a PCN reaction causing immediate rash, facial/tongue/throat swelling, SOB or lightheadedness with hypotension: NO Has patient had a  PCN reaction causing severe rash involving mucus membranes or skin necrosis: NO Has patient had a PCN reaction that required hospitalizationNO Has patient had a PCN reaction occurring within the last 10 years: NO If all of the above answers are "NO", then may proceed with Cephalosporin use.   [employer] Allergy Skin Test    Cat Hair Extract    Gramineae Pollens    Latex Itching   Shellfish Allergy     HOME MEDICATIONS: Outpatient Medications Prior to Visit  Medication Sig Dispense Refill   albuterol (VENTOLIN HFA) 108 (90 Base) MCG/ACT inhaler Inhale 2 puffs into the lungs every 6 (six) hours as needed for wheezing or shortness of breath.     alendronate (FOSAMAX) 10 MG tablet Take 10 mg by mouth once a week.     atorvastatin (LIPITOR) 10 MG tablet Take 2 tablets (20 mg total) by mouth daily. 90 tablet 2   loratadine (CLARITIN) 10 MG tablet Take 10 mg by mouth daily.     RESTASIS 0.05 % ophthalmic emulsion Place 1 drop into both eyes 2 (two) times daily.     Vitamin D, Ergocalciferol, (DRISDOL) 1.25 MG (50000 UNIT) CAPS capsule Take 1 capsule by mouth once a week.     levETIRAcetam (KEPPRA) 250 MG tablet Take 1 tablet (250 mg total) by mouth 2 (two) times daily. 180 tablet 4   No facility-administered medications prior to visit.    PAST MEDICAL HISTORY: Past Medical History:  Diagnosis Date   History of small bowel obstruction    x3   History of TIA (transient ischemic attack) 2003   Hyperlipemia    Seasonal allergies    Wears glasses     PAST SURGICAL HISTORY: Past Surgical History:  Procedure Laterality Date   CATARACT EXTRACTION     both eyes   CHOLECYSTECTOMY N/A 03/22/2015   Procedure: LAPAROSCOPIC CHOLECYSTECTOMY ;  Surgeon: Coralie Keens, MD;  Location: [facility];  Service: General;  Laterality: N/A;   COLONOSCOPY     FEMORAL HERNIA REPAIR  2000   small bowel resection-abstruction   TOTAL ABDOMINAL HYSTERECTOMY      FAMILY  HISTORY: History reviewed. No pertinent family history.  SOCIAL HISTORY: Social History   Socioeconomic History   Marital status: Single    Spouse name: Not on file   Number of children: Not on file   Years of education: Not on file   Highest education level: Not on file  Occupational History   Not on file  Tobacco Use   Smoking status: Former    Types: Cigarettes    Quit date: 03/20/1972    Years since quitting: 50.5   Smokeless tobacco: Never  Vaping Use   Vaping Use: Never used  Substance and Sexual Activity   Alcohol use: Yes    Comment: glass wine nightly   Drug use: No   Sexual activity: Not on file  Other Topics Concern   Not on file  Social History Narrative   Not on file   Social Determinants of Health   Financial Resource Strain: Not on file  Food Insecurity:  Not on file  Transportation Needs: Not on file  Physical Activity: Not on file  Stress: Not on file  Social Connections: Not on file  Intimate Partner Violence: Not on file     PHYSICAL EXAM  GENERAL EXAM/CONSTITUTIONAL: Vitals:  Vitals:   09/22/22 1352  BP: (!) 148/88  Weight: 184 lb (83.5 kg)  Height: 5\' 3"  (1.6 m)     Body mass index is 32.59 kg/m. Wt Readings from Last 3 Encounters:  09/22/22 184 lb (83.5 kg)  09/19/21 183 lb (83 kg)  06/19/21 184 lb (83.5 kg)   Patient is in no distress; well developed, nourished and groomed; neck is supple  EYES: Pupils round and reactive to light, Visual fields full to confrontation, Extraocular movements intacts,   MUSCULOSKELETAL: Gait, strength, tone, movements noted in Neurologic exam below  NEUROLOGIC: MENTAL STATUS:      No data to display         awake, alert, oriented to person, place and time recent and remote memory intact normal attention and concentration language fluent, comprehension intact, naming intact fund of knowledge appropriate  CRANIAL NERVE:  2nd, 3rd, 4th, 6th - pupils equal and reactive to light, visual  fields full to confrontation, extraocular muscles intact, no nystagmus 5th - facial sensation symmetric 7th - facial strength symmetric 8th - hearing intact 9th - palate elevates symmetrically, uvula midline 11th - shoulder shrug symmetric 12th - tongue protrusion midline  MOTOR:  normal bulk and tone, full strength in the BUE, BLE  SENSORY:  normal and symmetric to light touch, pinprick, temperature, vibration  COORDINATION:  finger-nose-finger, fine finger movements normal  REFLEXES:  deep tendon reflexes present and symmetric  GAIT/STATION:  normal   DIAGNOSTIC DATA (LABS, IMAGING, TESTING) - I reviewed patient records, labs, notes, testing and imaging myself where available.  MRI brain reviewed, results noted in the HPI  Lab Results  Component Value Date   WBC 3.9 (L) 04/24/2021   HGB 10.2 (L) 04/24/2021   HCT 30.8 (L) 04/24/2021   MCV 90.6 04/24/2021   PLT 221 04/24/2021      Component Value Date/Time   NA 136 04/24/2021 0315   K 3.5 04/24/2021 0315   CL 99 04/24/2021 0315   CO2 28 04/24/2021 0315   GLUCOSE 111 (H) 04/24/2021 0315   BUN 5 (L) 04/24/2021 0315   CREATININE 0.97 04/24/2021 0315   CALCIUM 9.2 04/24/2021 0315   PROT 6.6 04/07/2021 1313   ALBUMIN 3.5 04/07/2021 1313   AST 19 04/07/2021 1313   ALT 17 04/07/2021 1313   ALKPHOS 69 04/07/2021 1313   BILITOT 0.5 04/07/2021 1313   GFRNONAA 60 (L) 04/24/2021 0315   GFRAA >60 06/25/2016 1143   Lab Results  Component Value Date   CHOL 136 04/23/2021   HDL 50 04/23/2021   LDLCALC 76 04/23/2021   TRIG 51 04/23/2021   CHOLHDL 2.7 04/23/2021   Lab Results  Component Value Date   HGBA1C 5.7 (H) 04/23/2021   No results found for: "VITAMINB12"   MRI HEAD IMPRESSION: 1. No acute intracranial abnormality. 2. Mild chronic microvascular ischemic disease for age.    MRA HEAD IMPRESSION: 1. Negative intracranial MRA for large vessel occlusion. 2. Mild intracranial atherosclerotic change for age  with associated mild short-segment stenosis of the proximal left M1 segment. No other hemodynamically significant or correctable stenosis.    24 amb EEG:  This is an abnormal AVEEG due to the presence of left anterior-temporal sharps consistent with left anterior-temporal  region of epileptogenic potential.      ASSESSMENT AND PLAN  81 y.o. year old female here past medical history of small bowel obstruction, seasonal allergies, hyponatremia here for follow-up for abnormal EEG and seizure.  She is on Keppra 250 twice daily, denies any seizure or seizure-like activity but report daytime sleepiness, fatigue, vivid dreams and she would like to switch or come off Keppra.  I have informed the patient since she had a seizure and abnormal EEG, for now I would like to keep her on Keppra.  Since she has a history of hyponatremia, we will not give her a sodium channel blocker but I will instead try first extended release Keppra 500 mg daily.  I will also obtain another ambulatory EEG, if there is normalization of the EEG, then we will discuss about coming off medication.  She voices understanding.  Start with Keppra extended release 1 tablet nightly, continue to update [place] and follow-up in 1 year or sooner if worse    1. Seizure disorder [facility])     Patient Instructions  Discontinue Keppra 250 mg twice daily Start with Keppra extended release 500 mg nightly 48 hrs ambulatory EEG We will check a TSH and B12 level since there is complaint of fatigue and patient being a vegetarian Continue your other medications Follow-up in 1 year or sooner if worse    Orders Placed This Encounter  Procedures   TSH   Vitamin B12   AMBULATORY EEG     Meds ordered this encounter  Medications   DISCONTD: levETIRAcetam (KEPPRA XR) 500 MG 24 hr tablet    Sig: Take 1 tablet (500 mg total) by mouth daily.    Dispense:  90 tablet    Refill:  2   levETIRAcetam (KEPPRA XR) 500 MG 24 hr tablet    Sig: Take 1 tablet  (500 mg total) by mouth daily.    Dispense:  90 tablet    Refill:  2     Return in about 1 year (around 09/23/2023).    Alric Ran, MD 09/22/2022, 2:36 PM  [facility] [address] [place], [place] [address] [phone]

## 2022-09-23 LAB — TSH: TSH: 1.61 u[IU]/mL (ref 0.450–4.500)

## 2022-09-23 LAB — VITAMIN B12: Vitamin B-12: 387 pg/mL (ref 232–1245)

## 2022-09-23 MED ORDER — VITAMIN B-12 1000 MCG PO TABS: 1000.0000 ug | ORAL_TABLET | Freq: Every day | ORAL | 1 refills | Status: AC

## 2022-09-23 NOTE — Addendum Note (Signed)
Addended byAlric Ran on: 09/23/2022 08:17 AM   Modules accepted: Orders

## 2022-09-24 ENCOUNTER — Telehealth: Payer: Self-pay | Admitting: Neurology

## 2022-09-24 NOTE — Telephone Encounter (Signed)
Pt calling to report that her insurance states she would be responsible for $1,200.00-$1,300.00 Pt unable to afford.  Pt is asking for a call to discuss options

## 2022-09-25 NOTE — Telephone Encounter (Signed)
Unfortunately none. Let's continue the Keppra extended release 500 mg once a day.

## 2022-09-25 NOTE — Telephone Encounter (Signed)
I called pt and updated on MD's recommendation. She verbalized understanding and appreciation for the call. She will continue medication and follow up with our office in 1 year sooner if need be.

## 2022-09-25 NOTE — Telephone Encounter (Signed)
Pt called to report she received payment feed back on ambulatory EEG and her out of pocket cost would be between  $1,200.00-$1,300.00. She is unable to afford the cost of this procedure.   Wanted to let MD know and see if any other recommendations could be made.

## 2022-10-29 DIAGNOSIS — M9903 Segmental and somatic dysfunction of lumbar region: Secondary | ICD-10-CM | POA: Diagnosis not present

## 2022-10-29 DIAGNOSIS — M9902 Segmental and somatic dysfunction of thoracic region: Secondary | ICD-10-CM | POA: Diagnosis not present

## 2022-11-18 DIAGNOSIS — N289 Disorder of kidney and ureter, unspecified: Secondary | ICD-10-CM | POA: Diagnosis not present

## 2022-11-18 DIAGNOSIS — S39012A Strain of muscle, fascia and tendon of lower back, initial encounter: Secondary | ICD-10-CM | POA: Diagnosis not present

## 2022-11-18 DIAGNOSIS — U071 COVID-19: Secondary | ICD-10-CM | POA: Diagnosis not present

## 2022-12-16 ENCOUNTER — Telehealth: Payer: Self-pay

## 2022-12-16 NOTE — Telephone Encounter (Signed)
Pt scheduled for In-Home EEG 12/28/22-12/30/22.

## 2022-12-16 NOTE — Telephone Encounter (Signed)
Pt was called and the message from Tonita Cong, RN was relayed.  This is FYI no call back requested

## 2022-12-16 NOTE — Telephone Encounter (Signed)
Please update pt, EEG results will be given to Dr. April Manson once received we will determine when follow up needs to take place.  Thanks

## 2022-12-16 NOTE — Telephone Encounter (Signed)
Pt asking will results be sent to Dr. April Manson and will need to schedule an appt after the in home EEG is done? Would like a call from the nurse.

## 2022-12-18 NOTE — Telephone Encounter (Signed)
Error

## 2022-12-31 DIAGNOSIS — G40001 Localization-related (focal) (partial) idiopathic epilepsy and epileptic syndromes with seizures of localized onset, not intractable, with status epilepticus: Secondary | ICD-10-CM

## 2022-12-31 DIAGNOSIS — R9401 Abnormal electroencephalogram [EEG]: Secondary | ICD-10-CM

## 2023-01-05 ENCOUNTER — Other Ambulatory Visit: Payer: Self-pay | Admitting: *Deleted

## 2023-01-05 DIAGNOSIS — Z1231 Encounter for screening mammogram for malignant neoplasm of breast: Secondary | ICD-10-CM

## 2023-01-05 NOTE — Telephone Encounter (Signed)
Pt called and LVM wanting to know when she will be called with her EEG results. Please advise.

## 2023-01-06 NOTE — Telephone Encounter (Signed)
Please inform patient that we will complete the report in the next 48 hrs thanks.

## 2023-01-08 ENCOUNTER — Encounter: Payer: Self-pay | Admitting: Neurology

## 2023-01-08 DIAGNOSIS — G40909 Epilepsy, unspecified, not intractable, without status epilepticus: Secondary | ICD-10-CM

## 2023-01-08 NOTE — Procedures (Signed)
Clinical History : This is a 82y/o F who presents for follow-up visit. She was last seen in October 2022 and has no reported seizures since that time. She has complaints of day time somnolence and vivid dreams. She also reports fatigue and low every. she is a vegetarian and was not taking a B12 supplement.  INTERMITTENT MONITORING with VIDEO TECHNICAL SUMMARY: This AVEEG was performed using equipment provided by [employer] utilizing Bluetooth ( Trackit ) amplifiers with continuous EEGT attended video collection using encrypted remote transmission via [place] secured cellular tower network with data rates for each AVEEG performed. This is a Biomedical engineer AVEEG, obtained, according to the 10-20 international electrode placement system, reformatted digitally into referential and bipolar montages. Data was acquired with a minimum of 21 bipolar connections and sampled at a minimum rate of 250 cycles per second per channel, maximum rate of 450 cycles per second per channel and two channels for EKG. The entire VEEG study was recorded through cable and or radio telemetry for subsequent analysis. Specified epochs of the AVEEG data were identified at the direction of the subject by the depression of a push button by the patient. Each patients event file included data acquired two minutes prior to the push button activation and continuing until two minutes afterwards. AVEEG files were reviewed on [place], Licensed Software provided by [employer] with a digital high frequency filter set at 70 Hz and a low frequency filter set at 1 Hz with a paper speed of 45m/s resulting in 10 seconds per digital page. This entire AVEEG was reviewed by the EEG Technologist. Random time samples, random sleep samples, clips, patient initiated push button files with included patient daily diary logs, EEG Technologist pruned data was reviewed and verified for accuracy and validity by the governing  reading neurologist in full details. This AEEGV was fully compliant with all requirements for CPT 97500 for setup, patient education, take down and administered by an EEG technologist. Long-Term EEG with Video was monitored intermittently by a qualified EEG technologist for the entirety of the recording; quality check-ins were performed at a minimum of every two hours, checking and documenting real-time data and video to assure the integrity and quality of the recording (e.g., camera position, electrode integrity and impedance), and identify the need for maintenance. For intermittent monitoring, an EEG Technologist monitored no more than 12 patients concurrently. Diagnostic video was captured at least 80% of the time during the recording.  PATIENT EVENTS: There were 3 patient events noted or captured during this recording. Patient Event - #3: SLIGHT HEADACHE; PATIENT WAS NOT ON CAMERA. NO ICTAL CHANGES NOTED.  2022/12/30 15:45:46.70 0.00s Patient Event - #2: SLIGHT HEADACHE PATIENT WAS NOT ON CAMERA. NO ICTAL CHANGES NOTED.  2022/12/30 12:39:49.50 0.00s Patient Event - #1: SLIGHT HEADACHE; PATIENT WAS ON CAMERA, ASLEEP. NO ICTAL CHANGES NOTED.  2022/12/30 03:29:20.20 0.00s  TECHNOLOGIST EVENTS: High amplitude left temporal sharp discharges during sleep.  TIME SAMPLES: 10-minutes of every 2 hours recorded are reviewed as random time samples.  SLEEP SAMPLES: 5-minutes of every 24 hours recorded are reviewed as random sleep samples.  AWAKE: At maximal level of alertness, the posterior dominant background activity was continuous, reactive, low voltage rhythm of 9.5 Hz. This was symmetric, well-modulated, and attenuated with eye opening. Diffuse, symmetric, frontocentral beta range activity was present.  SLEEP: N1 Sleep (Stage 1) was observed and characterized by the disappearance of alpha rhythm and the appearance of vertex activity. N2 Sleep (Stage 2) was  observed and characterized by vertex  waves, K-complexes, and sleep spindles. N3 (Stage 3) sleep was observed and characterized by high amplitude Delta activity of 20%. REM sleep was observed.  EKG: There were no arrhythmias or abnormalities noted during this recording.   Impression: This is an abnormal ambulatory video EEG due to presence of left temporal epileptiform discharges. This is consistent with an area of increase epileptogenic potential in the left temporal region. There were 3 events described as headaches with no changes in EEG background. No seizures captured during this study.   Alric Ran, MD [facility]

## 2023-02-16 ENCOUNTER — Other Ambulatory Visit: Payer: Self-pay

## 2023-02-16 DIAGNOSIS — G40909 Epilepsy, unspecified, not intractable, without status epilepticus: Secondary | ICD-10-CM

## 2023-02-16 MED ORDER — LEVETIRACETAM ER 500 MG PO TB24: 500.0000 mg | ORAL_TABLET | Freq: Every day | ORAL | 2 refills | Status: DC

## 2023-02-20 ENCOUNTER — Ambulatory Visit
Admission: RE | Admit: 2023-02-20 | Discharge: 2023-02-20 | Disposition: A | Discharge status: Home / Self Care | Payer: Medicare HMO | Source: Ambulatory Visit | Attending: *Deleted | Admitting: *Deleted

## 2023-02-20 DIAGNOSIS — Z1231 Encounter for screening mammogram for malignant neoplasm of breast: Secondary | ICD-10-CM

## 2023-06-09 ENCOUNTER — Telehealth: Payer: Self-pay | Admitting: Neurology

## 2023-06-09 ENCOUNTER — Other Ambulatory Visit: Payer: Self-pay | Admitting: Neurology

## 2023-06-09 MED ORDER — LEVETIRACETAM 250 MG PO TABS: 250.0000 mg | ORAL_TABLET | Freq: Every day | ORAL | 1 refills | Status: DC

## 2023-06-09 NOTE — Telephone Encounter (Signed)
Call back to patient, she states she is waking up in the night and can't go back to sleep/ The high dose was giving her vivid dreams but lower dose she is can't fall back asleep no seizure activity noted. Advise I would send to Dr. Teresa Coombs for advise and recommendations.Patient appreciative of call.

## 2023-06-09 NOTE — Telephone Encounter (Signed)
250 mg  sent to pharmacy

## 2023-06-09 NOTE — Telephone Encounter (Signed)
Pt said, having side effects from levETIRAcetam (KEPPRA XR) 500 MG 24 hr tablet wake up in the night and can not to go back to sleep.

## 2023-06-09 NOTE — Telephone Encounter (Signed)
Call to patient, she is taking it in the morning. She is in agreement to switch to 250 mg XR Keppra and will let us know if this dose doesn't work for her. Routed to Dr. Teresa Coombs for review and prescription.

## 2023-06-09 NOTE — Telephone Encounter (Signed)
Please inquire if patient taking the medication at night, if so, she should take it in the morning. If it is still a problem, we can further reduce it to 250

## 2023-08-25 ENCOUNTER — Other Ambulatory Visit: Payer: Self-pay

## 2023-08-25 MED ORDER — LEVETIRACETAM 250 MG PO TABS: 250.0000 mg | ORAL_TABLET | Freq: Every day | ORAL | 1 refills | Status: DC

## 2023-11-03 ENCOUNTER — Telehealth: Payer: Self-pay | Admitting: Neurology

## 2023-11-03 NOTE — Telephone Encounter (Signed)
Pt would like a call back to  discuss levETIRAcetam (KEPPRA) 250 MG tablet had been change to once a day. [place] want to know if prescription needs to be changed. Also need access to  test results if something happens. Pt decided not to schedule an  appt due to was so far out

## 2023-11-04 ENCOUNTER — Telehealth: Payer: Self-pay | Admitting: Neurology

## 2023-11-04 ENCOUNTER — Telehealth: Payer: Self-pay

## 2023-11-04 NOTE — Telephone Encounter (Signed)
Will recommend to continue on Keppra 250 mg daily. Her EEG showed left temporal epileptiform discharges and her event was consistent with a focal seizure.

## 2023-11-04 NOTE — Telephone Encounter (Signed)
Call to patient who reports that she is currently taking 250 mg Keppra daily but doesn't want tot take if not needed. She didn't rea;ize she didn't have an appointment scheduled with Dr. Teresa Coombs for an annual follow up and she will be moving out of the country in the near future and wants to make sure she needs to continue the Keppra. She doesn't believe she has ever had a seizure and would like the EEG results  sent via mychart. Advised I would send those. I have added her to the schedule for 01/27/24 and will also add to the waitlist.

## 2023-11-04 NOTE — Telephone Encounter (Signed)
Mychart message sent.

## 2023-11-04 NOTE — Telephone Encounter (Signed)
Call to patient, no answer. Left message to call back.

## 2023-11-04 NOTE — Telephone Encounter (Signed)
Pt has returned the call to April, RN she is asking for a call back

## 2023-11-04 NOTE — Telephone Encounter (Signed)
Pt has returned call to April, RN

## 2023-12-07 ENCOUNTER — Telehealth: Payer: Self-pay | Admitting: Neurology

## 2023-12-07 ENCOUNTER — Encounter: Payer: Self-pay | Admitting: Neurology

## 2023-12-07 ENCOUNTER — Ambulatory Visit: Payer: Medicare HMO | Admitting: Neurology

## 2023-12-07 VITALS — BP 132/78 | HR 76 | Ht 63.0 in | Wt 179.0 lb

## 2023-12-07 DIAGNOSIS — R252 Cramp and spasm: Secondary | ICD-10-CM | POA: Diagnosis not present

## 2023-12-07 DIAGNOSIS — G40909 Epilepsy, unspecified, not intractable, without status epilepticus: Secondary | ICD-10-CM

## 2023-12-07 MED ORDER — LEVETIRACETAM 250 MG PO TABS: 250.0000 mg | ORAL_TABLET | Freq: Two times a day (BID) | ORAL | 3 refills | Status: AC

## 2023-12-07 NOTE — Telephone Encounter (Signed)
 Pt called to confirm her appointment time

## 2023-12-07 NOTE — Telephone Encounter (Signed)
 Appointment may need to be r/s, pt will call back if she is unable to get a ride.

## 2023-12-07 NOTE — Progress Notes (Signed)
 [facility]  PATIENT: Jennifer Calhoun DOB: 23-Jan-1941  REFERRING CLINICIAN: Cristopher Calhoun HERO, NP HISTORY FROM: Patient  REASON FOR VISIT: Abnormal EEG, possible seizure   HISTORICAL  CHIEF COMPLAINT:  Chief Complaint  Patient presents with   Follow-up    Pt in 12, here alone Pt is here for seizure follow up. Pt states has not had any seizure activity. Continues taking Keppra  250 mg daily.  Pt also states she is having cramps leg cramps, states usually happens when laying down.    INTERVAL HISTORY 12/07/2023:  Patient presents today for follow up. Last visit was October 2023. Since then, she has been doing well. Denies any seizure or seizure like activity. Her repeat ambulatory EEG showed left temporal discharges. She is compliant with Keppra  250 mg daily. She is still having trouble with sleep, not sleeping at all or waking up early in the morning and unable to fall back asleep. On top of that, she is complaining of leg cramp, mostly when it is cold at night, in her bad.  Trying to sell her house to move to [place]   INTERVAL HISTORY 09/22/22:  Patient presents today for follow-up, last visit was in October 2022.  Since last visit she has not had any seizure or seizure-like activity.  She is on Keppra  to 50 mg twice daily but still complains of daytime somnolence, vivid dream.  She really does not want to stay on the Keppra . She does report fatigue and low energy, she reports that she is a vegetarian and she is not taking any B12 supplement   INTERVAL HISTORY 09/19/2021: Patient present today for follow-up, since last clinic visit, no abnormal movements noted, no speech difficulty, no other concerns.  However she did have side effect of the Keppra  described as vivid dreams and feeling tired all the time.  She decreased the dosage to 250 twice daily and to report improvement of her symptoms.  Her 24-hour ambulatory tori EEG was also abnormal showing left temporal  sharp.  I have explained to her that due to the abnormal EEG it is reasonable to continue her on antiseizure medication for at least 1 year or 2 given no seizure or no other concerns  HISTORY OF PRESENT ILLNESS:  83 year old women with past medical history of of small bowel obstruction hyperlipidemia seasonal allergy and TIA who is presenting for an abnormal EEG.  Patient reported that in the month of May she was feeling lightheaded in the setting of drinking lots of water, to help with her constipation and and presented to Urgent care.  Initial labs done showed that she had hyponatremia but he preferred to go home and follow up with her primary care doctor.  Her symptoms of of lightheadedness did not resolved, therefore she presented to the ED.  In the ED she was noted to be hyponatremic therefore she was admitted for further work-up.  From chart review it was documented that she was having some speech difficulty and word finding difficulty and also noted to have abnormal movements.  Patient denies any history of abnormal movements. She mentioned that what she is feels anxious he appeared to be stuttering.  She also reported during her admission her daughter who knows her well was in the hospital and reported to providers that her speech was normal  During her work-up for for hyponatremia she did have a routine EEG and a overnight EEG which showed a left temporal spikes, and because of the abnormal EEG  and after further discussion with the patient, plan was to start her on Keppra  500 twice daily and to follow-up with outpatient neurology  Patient reports that since leaving the hospital she has been doing fine.  Her lightheadedness resolved, she reports that she had additional lab work and her sodium normalized.  She denies any previous history of seizures denies any family history of seizures denies any seizure risk factors.   She had a brain MRI which showed a cerebral volume with within normal limits  for patient's age there was some scattered patchy T2 flair hyperintense hyperintensity within the periventricular deep and subcortical white matter most consistent consistent with chronic small vessel ischemic disease no evidence of seizures and no evidence of encephalomalacia   She has a history of rectal bleed, history of diverticulitis, and she is scheduled for colonoscopy  REVIEW OF SYSTEMS: Full 14 system review of systems performed and negative with exception of: as noted in the HPI  ALLERGIES: Allergies  Allergen Reactions   Penicillins     Has patient had a PCN reaction causing immediate rash, facial/tongue/throat swelling, SOB or lightheadedness with hypotension: NO Has patient had a PCN reaction causing severe rash involving mucus membranes or skin necrosis: NO Has patient had a PCN reaction that required hospitalizationNO Has patient had a PCN reaction occurring within the last 10 years: NO If all of the above answers are NO, then may proceed with Cephalosporin use.   [place] Allergy Skin Test    Cat Hair Extract    Gramineae Pollens    Latex Itching   Shellfish Allergy     HOME MEDICATIONS: Outpatient Medications Prior to Visit  Medication Sig Dispense Refill   albuterol  (VENTOLIN  HFA) 108 (90 Base) MCG/ACT inhaler Inhale 2 puffs into the lungs every 6 (six) hours as needed for wheezing or shortness of breath.     alendronate (FOSAMAX) 10 MG tablet Take 10 mg by mouth once a week.     atorvastatin  (LIPITOR) 10 MG tablet Take 2 tablets (20 mg total) by mouth daily. 90 tablet 2   loratadine (CLARITIN) 10 MG tablet Take 10 mg by mouth daily.     Vitamin D, Ergocalciferol, (DRISDOL) 1.25 MG (50000 UNIT) CAPS capsule Take 1 capsule by mouth once a week.     levETIRAcetam  (KEPPRA ) 250 MG tablet Take 1 tablet (250 mg total) by mouth daily. 90 tablet 1   RESTASIS  0.05 % ophthalmic emulsion Place 1 drop into both eyes 2 (two) times daily. (Patient not taking: Reported on  12/07/2023)     No facility-administered medications prior to visit.    PAST MEDICAL HISTORY: Past Medical History:  Diagnosis Date   History of small bowel obstruction    x3   History of TIA (transient ischemic attack) 2003   Hyperlipemia    Seasonal allergies    Wears glasses     PAST SURGICAL HISTORY: Past Surgical History:  Procedure Laterality Date   CATARACT EXTRACTION     both eyes   CHOLECYSTECTOMY N/A 03/22/2015   Procedure: LAPAROSCOPIC CHOLECYSTECTOMY ;  Surgeon: Vicenta Poli, MD;  Location: [facility];  Service: General;  Laterality: N/A;   COLONOSCOPY     FEMORAL HERNIA REPAIR  2000   small bowel resection-abstruction   TOTAL ABDOMINAL HYSTERECTOMY      FAMILY HISTORY: Family History  Problem Relation Age of Onset   Breast cancer Neg Hx     SOCIAL HISTORY: Social History   Socioeconomic History  Marital status: Single    Spouse name: Not on file   Number of children: Not on file   Years of education: Not on file   Highest education level: Not on file  Occupational History   Not on file  Tobacco Use   Smoking status: Former    Current packs/day: 0.00    Types: Cigarettes    Quit date: 03/20/1972    Years since quitting: 51.7   Smokeless tobacco: Never  Vaping Use   Vaping status: Never Used  Substance and Sexual Activity   Alcohol use: Yes    Comment: glass wine nightly   Drug use: No   Sexual activity: Not on file  Other Topics Concern   Not on file  Social History Narrative   Not on file   Social Drivers of Health   Financial Resource Strain: Not on file  Food Insecurity: Not on file  Transportation Needs: Not on file  Physical Activity: Not on file  Stress: Not on file  Social Connections: Unknown (04/08/2022)   Received from [facility]   Social Network    Social Network: Not on file  Intimate Partner Violence: Unknown (02/28/2022)   Received from [facility], [facility]   HITS     Physically Hurt: Not on file    Insult or Talk Down To: Not on file    Threaten Physical Harm: Not on file    Scream or Curse: Not on file     PHYSICAL EXAM  GENERAL EXAM/CONSTITUTIONAL: Vitals:  Vitals:   12/07/23 1246  BP: 132/78  Pulse: 76  Weight: 179 lb (81.2 kg)  Height: 5' 3 (1.6 m)      Body mass index is 31.71 kg/m. Wt Readings from Last 3 Encounters:  12/07/23 179 lb (81.2 kg)  09/22/22 184 lb (83.5 kg)  09/19/21 183 lb (83 kg)   Patient is in no distress; well developed, nourished and groomed; neck is supple  MUSCULOSKELETAL: Gait, strength, tone, movements noted in Neurologic exam below  NEUROLOGIC: MENTAL STATUS:      No data to display         awake, alert, oriented to person, place and time recent and remote memory intact normal attention and concentration language fluent, comprehension intact, naming intact fund of knowledge appropriate  CRANIAL NERVE:  2nd, 3rd, 4th, 6th - pupils equal and reactive to light, visual fields full to confrontation, extraocular muscles intact, no nystagmus 5th - facial sensation symmetric 7th - facial strength symmetric 8th - hearing intact 9th - palate elevates symmetrically, uvula midline 11th - shoulder shrug symmetric 12th - tongue protrusion midline  MOTOR:  normal bulk and tone, full strength in the BUE, BLE  SENSORY:  normal and symmetric to light touch, pinprick, temperature, vibration  COORDINATION:  finger-nose-finger, fine finger movements normal  REFLEXES:  deep tendon reflexes present and symmetric  GAIT/STATION:  normal   DIAGNOSTIC DATA (LABS, IMAGING, TESTING) - I reviewed patient records, labs, notes, testing and imaging myself where available.  MRI brain reviewed, results noted in the HPI  Lab Results  Component Value Date   WBC 3.9 (L) 04/24/2021   HGB 10.2 (L) 04/24/2021   HCT 30.8 (L) 04/24/2021   MCV 90.6 04/24/2021   PLT 221 04/24/2021      Component Value  Date/Time   NA 136 04/24/2021 0315   K 3.5 04/24/2021 0315   CL 99 04/24/2021 0315   CO2 28 04/24/2021 0315   GLUCOSE 111 (H)  04/24/2021 0315   BUN 5 (L) 04/24/2021 0315   CREATININE 0.97 04/24/2021 0315   CALCIUM  9.2 04/24/2021 0315   PROT 6.6 04/07/2021 1313   ALBUMIN 3.5 04/07/2021 1313   AST 19 04/07/2021 1313   ALT 17 04/07/2021 1313   ALKPHOS 69 04/07/2021 1313   BILITOT 0.5 04/07/2021 1313   GFRNONAA 60 (L) 04/24/2021 0315   GFRAA >60 06/25/2016 1143   Lab Results  Component Value Date   CHOL 136 04/23/2021   HDL 50 04/23/2021   LDLCALC 76 04/23/2021   TRIG 51 04/23/2021   CHOLHDL 2.7 04/23/2021   Lab Results  Component Value Date   HGBA1C 5.7 (H) 04/23/2021   Lab Results  Component Value Date   VITAMINB12 387 09/22/2022     MRI HEAD IMPRESSION: 1. No acute intracranial abnormality. 2. Mild chronic microvascular ischemic disease for age.    MRA HEAD IMPRESSION: 1. Negative intracranial MRA for large vessel occlusion. 2. Mild intracranial atherosclerotic change for age with associated mild short-segment stenosis of the proximal left M1 segment. No other hemodynamically significant or correctable stenosis.    24 amb EEG:  This is an abnormal AVEEG due to the presence of left anterior-temporal sharps consistent with left anterior-temporal region of epileptogenic potential.      Ambulatory EEG 01/08/2023 This is an abnormal ambulatory video EEG due to presence of left temporal epileptiform discharges.     ASSESSMENT AND PLAN  83 y.o. year old female here past medical history of small bowel obstruction, seasonal allergies, hyponatremia here for follow-up for abnormal EEG and seizure like activity.  She is compliant with her Keppra  250 mg daily, stated that she has some sleep disturbance, not sure if related to Keppra  or not.  Her repeat ambulatory EEG February 2024 show left temporal epileptiform discharge.  Plan for now is to continue patient on Keppra , to  50 mg daily, and she will contact us  with updates. Intermittent leg cramps, she had a history of both hyponatremia and hypokalemia, we will obtain a BMP and magnesium.  If low will replace as indicated   1. Seizure disorder (HCC)   2. Muscle cramp     Patient Instructions  Continue with Keppra  250 mg daily  Continue your other medications  BMP to check on electrolytes  Follow up in a year or sooner if worse     Orders Placed This Encounter  Procedures   Basic Metabolic Panel   Magnesium     Meds ordered this encounter  Medications   levETIRAcetam  (KEPPRA ) 250 MG tablet    Sig: Take 1 tablet (250 mg total) by mouth 2 (two) times daily.    Dispense:  180 tablet    Refill:  3     Return in about 1 year (around 12/06/2024).    Pastor Falling, MD 12/07/2023, 1:41 PM  [facility] [address] [place], [place] [address] [phone]

## 2023-12-07 NOTE — Patient Instructions (Addendum)
 Continue with Keppra 250 mg daily  Continue your other medications  BMP to check on electrolytes  Follow up in a year or sooner if worse

## 2023-12-08 LAB — BASIC METABOLIC PANEL
BUN/Creatinine Ratio: 6 — ABNORMAL LOW (ref 12–28)
BUN: 6 mg/dL — ABNORMAL LOW (ref 8–27)
CO2: 23 mmol/L (ref 20–29)
Calcium: 9.2 mg/dL (ref 8.7–10.3)
Chloride: 92 mmol/L — ABNORMAL LOW (ref 96–106)
Creatinine, Ser: 1 mg/dL (ref 0.57–1.00)
Glucose: 86 mg/dL (ref 70–99)
Potassium: 4.8 mmol/L (ref 3.5–5.2)
Sodium: 129 mmol/L — ABNORMAL LOW (ref 134–144)
eGFR: 57 mL/min/{1.73_m2} — ABNORMAL LOW (ref >59)

## 2023-12-08 LAB — MAGNESIUM: Magnesium: 2 mg/dL (ref 1.6–2.3)

## 2024-01-27 ENCOUNTER — Ambulatory Visit: Payer: Medicare HMO | Admitting: Neurology

## 2024-03-17 ENCOUNTER — Other Ambulatory Visit (HOSPITAL_BASED_OUTPATIENT_CLINIC_OR_DEPARTMENT_OTHER): Payer: Self-pay | Admitting: *Deleted

## 2024-03-17 DIAGNOSIS — M25461 Effusion, right knee: Secondary | ICD-10-CM

## 2024-03-22 ENCOUNTER — Other Ambulatory Visit: Payer: Self-pay | Admitting: Neurology

## 2024-03-22 ENCOUNTER — Telehealth: Payer: Self-pay | Admitting: Neurology

## 2024-03-22 NOTE — Telephone Encounter (Signed)
 Call to patient, advised that a script was also [facility] in [facility] and she is in agreement to call and see is script is still active. Will follow up if needed

## 2024-03-22 NOTE — Telephone Encounter (Signed)
 Pt called stating that the [facility] where she picked up her levETIRAcetam  (KEPPRA ) 250 MG tablet had a old Rx and they only gave her the medication with enough pills for once a day Pt is needing enough for BID. Please advise.

## 2024-03-22 NOTE — Telephone Encounter (Signed)
 Her prescription is for Keppra  250 mg BID and she should have enough refills left.

## 2024-03-22 NOTE — Telephone Encounter (Signed)
 Call to patient, she reports that she has been taking Keppra  250 mg twice daily and I reviewed notes and it says patient takes 250 mg nightly. She wishes to stay on 250 mg twice daily. Advised I will let Dr. Samara Crest know. She has not had seizures and has not side effects

## 2024-03-24 ENCOUNTER — Ambulatory Visit (HOSPITAL_BASED_OUTPATIENT_CLINIC_OR_DEPARTMENT_OTHER)
Admission: RE | Admit: 2024-03-24 | Discharge: 2024-03-24 | Disposition: A | Discharge status: Home / Self Care | Source: Ambulatory Visit | Attending: *Deleted | Admitting: *Deleted

## 2024-03-24 DIAGNOSIS — M25561 Pain in right knee: Secondary | ICD-10-CM | POA: Diagnosis present

## 2024-03-24 DIAGNOSIS — M25461 Effusion, right knee: Secondary | ICD-10-CM | POA: Diagnosis present

## 2024-06-16 ENCOUNTER — Ambulatory Visit: Payer: Medicare HMO | Admitting: Dermatology

## 2025-01-23 ENCOUNTER — Ambulatory Visit: Payer: Medicare HMO | Admitting: Neurology
